# Patient Record
Sex: Female | Born: 1990 | Race: White | Hispanic: No | Marital: Married | Smoking: Former smoker
Health system: Southern US, Community
[De-identification: ages and names within clinical notes are randomized; demographics above are authoritative.]

## PROBLEM LIST (undated history)

## (undated) DIAGNOSIS — Z87440 Personal history of urinary (tract) infections: Secondary | ICD-10-CM

## (undated) DIAGNOSIS — Z87442 Personal history of urinary calculi: Secondary | ICD-10-CM

## (undated) DIAGNOSIS — Z22322 Carrier or suspected carrier of Methicillin resistant Staphylococcus aureus: Secondary | ICD-10-CM

## (undated) DIAGNOSIS — F32A Depression, unspecified: Secondary | ICD-10-CM

## (undated) DIAGNOSIS — B019 Varicella without complication: Secondary | ICD-10-CM

## (undated) DIAGNOSIS — J45909 Unspecified asthma, uncomplicated: Secondary | ICD-10-CM

## (undated) DIAGNOSIS — F329 Major depressive disorder, single episode, unspecified: Secondary | ICD-10-CM

## (undated) DIAGNOSIS — M1990 Unspecified osteoarthritis, unspecified site: Secondary | ICD-10-CM

## (undated) DIAGNOSIS — F419 Anxiety disorder, unspecified: Secondary | ICD-10-CM

## (undated) DIAGNOSIS — Z803 Family history of malignant neoplasm of breast: Secondary | ICD-10-CM

## (undated) DIAGNOSIS — N200 Calculus of kidney: Secondary | ICD-10-CM

## (undated) HISTORY — PX: TUBAL LIGATION: SHX77

## (undated) HISTORY — DX: Personal history of urinary (tract) infections: Z87.440

## (undated) HISTORY — DX: Depression, unspecified: F32.A

## (undated) HISTORY — DX: Unspecified asthma, uncomplicated: J45.909

## (undated) HISTORY — DX: Family history of malignant neoplasm of breast: Z80.3

## (undated) HISTORY — DX: Varicella without complication: B01.9

## (undated) HISTORY — DX: Calculus of kidney: N20.0

## (undated) HISTORY — DX: Major depressive disorder, single episode, unspecified: F32.9

## (undated) HISTORY — DX: Carrier or suspected carrier of methicillin resistant Staphylococcus aureus: Z22.322

---
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
## 2011-08-07 ENCOUNTER — Emergency Department: Payer: Self-pay | Admitting: Unknown Physician Specialty

## 2012-11-14 HISTORY — PX: FOOT SURGERY: SHX648

## 2012-12-15 HISTORY — PX: WOUND DEBRIDEMENT: SHX247

## 2013-01-11 ENCOUNTER — Emergency Department: Payer: Self-pay | Admitting: Emergency Medicine

## 2013-01-11 LAB — URINALYSIS, COMPLETE
Bilirubin,UR: NEGATIVE
Ketone: NEGATIVE
Nitrite: NEGATIVE
Ph: 8 (ref 4.5–8.0)
Protein: NEGATIVE
RBC,UR: 4 /HPF (ref 0–5)
Squamous Epithelial: 17

## 2013-01-11 LAB — CBC
HCT: 41.3 % (ref 35.0–47.0)
HGB: 13.6 g/dL (ref 12.0–16.0)
MCH: 30.2 pg (ref 26.0–34.0)
MCHC: 33 g/dL (ref 32.0–36.0)
Platelet: 318 10*3/uL (ref 150–440)

## 2013-01-11 LAB — LIPASE, BLOOD: Lipase: 136 U/L (ref 73–393)

## 2013-01-11 LAB — COMPREHENSIVE METABOLIC PANEL
Albumin: 4 g/dL (ref 3.4–5.0)
Alkaline Phosphatase: 82 U/L (ref 50–136)
Anion Gap: 3 — ABNORMAL LOW (ref 7–16)
BUN: 11 mg/dL (ref 7–18)
Calcium, Total: 9 mg/dL (ref 8.5–10.1)
Creatinine: 0.82 mg/dL (ref 0.60–1.30)
EGFR (African American): >60
EGFR (Non-African Amer.): >60
Glucose: 89 mg/dL (ref 65–99)
SGOT(AST): 16 U/L (ref 15–37)
SGPT (ALT): 27 U/L (ref 12–78)
Sodium: 141 mmol/L (ref 136–145)
Total Protein: 7.5 g/dL (ref 6.4–8.2)

## 2013-01-12 LAB — WET PREP, GENITAL

## 2013-02-04 ENCOUNTER — Ambulatory Visit: Payer: Self-pay | Admitting: Family Medicine

## 2013-04-03 ENCOUNTER — Emergency Department: Payer: Self-pay | Admitting: Emergency Medicine

## 2013-04-03 LAB — BASIC METABOLIC PANEL
Anion Gap: 7 (ref 7–16)
Calcium, Total: 8.8 mg/dL (ref 8.5–10.1)
Creatinine: 0.89 mg/dL (ref 0.60–1.30)
EGFR (African American): >60
EGFR (Non-African Amer.): >60
Glucose: 114 mg/dL — ABNORMAL HIGH (ref 65–99)
Osmolality: 279 (ref 275–301)
Sodium: 139 mmol/L (ref 136–145)

## 2013-04-03 LAB — URINALYSIS, COMPLETE
Bacteria: NONE SEEN
Ketone: NEGATIVE
Nitrite: NEGATIVE
Ph: 6 (ref 4.5–8.0)
Protein: NEGATIVE
Squamous Epithelial: 5
WBC UR: 4 /HPF (ref 0–5)

## 2013-04-03 LAB — CBC
HCT: 37.9 % (ref 35.0–47.0)
HGB: 13 g/dL (ref 12.0–16.0)
MCH: 30.9 pg (ref 26.0–34.0)
MCHC: 34.3 g/dL (ref 32.0–36.0)
MCV: 90 fL (ref 80–100)
Platelet: 269 10*3/uL (ref 150–440)

## 2013-06-21 ENCOUNTER — Ambulatory Visit: Payer: Self-pay | Admitting: Family Medicine

## 2013-06-21 LAB — URINALYSIS, COMPLETE
Bilirubin,UR: NEGATIVE
Ketone: NEGATIVE

## 2013-06-23 LAB — URINE CULTURE

## 2014-03-13 ENCOUNTER — Emergency Department: Payer: Self-pay | Admitting: Emergency Medicine

## 2014-03-13 LAB — URINALYSIS, COMPLETE
BACTERIA: NONE SEEN
BILIRUBIN, UR: NEGATIVE
GLUCOSE, UR: NEGATIVE mg/dL (ref 0–75)
Nitrite: NEGATIVE
PH: 5 (ref 4.5–8.0)
Protein: NEGATIVE
Specific Gravity: 1.016 (ref 1.003–1.030)

## 2014-08-29 ENCOUNTER — Emergency Department: Payer: Self-pay | Admitting: Emergency Medicine

## 2014-08-29 LAB — CBC WITH DIFFERENTIAL/PLATELET
Basophil #: 0.1 10*3/uL (ref 0.0–0.1)
Basophil %: 0.5 %
EOS ABS: 0.7 10*3/uL (ref 0.0–0.7)
Eosinophil %: 5 %
HCT: 42.2 % (ref 35.0–47.0)
HGB: 13.7 g/dL (ref 12.0–16.0)
Lymphocyte #: 2.4 10*3/uL (ref 1.0–3.6)
Lymphocyte %: 15.8 %
MCH: 30.6 pg (ref 26.0–34.0)
MCHC: 32.4 g/dL (ref 32.0–36.0)
MCV: 95 fL (ref 80–100)
MONO ABS: 1.1 x10 3/mm — AB (ref 0.2–0.9)
Monocyte %: 7.5 %
NEUTROS ABS: 10.6 10*3/uL — AB (ref 1.4–6.5)
Neutrophil %: 71.2 %
Platelet: 231 10*3/uL (ref 150–440)
RBC: 4.47 10*6/uL (ref 3.80–5.20)
RDW: 13 % (ref 11.5–14.5)
WBC: 14.9 10*3/uL — AB (ref 3.6–11.0)

## 2014-08-29 LAB — BASIC METABOLIC PANEL
Anion Gap: 9 (ref 7–16)
BUN: 13 mg/dL (ref 7–18)
CALCIUM: 8.5 mg/dL (ref 8.5–10.1)
Chloride: 104 mmol/L (ref 98–107)
Co2: 26 mmol/L (ref 21–32)
Creatinine: 0.8 mg/dL (ref 0.60–1.30)
EGFR (African American): >60
GLUCOSE: 88 mg/dL (ref 65–99)
Osmolality: 277 (ref 275–301)
Potassium: 4.3 mmol/L (ref 3.5–5.1)
Sodium: 139 mmol/L (ref 136–145)

## 2014-08-30 ENCOUNTER — Inpatient Hospital Stay: Payer: Self-pay | Admitting: Internal Medicine

## 2014-08-30 LAB — CBC
HCT: 39.6 % (ref 35.0–47.0)
HGB: 12.7 g/dL (ref 12.0–16.0)
MCH: 30.7 pg (ref 26.0–34.0)
MCHC: 32 g/dL (ref 32.0–36.0)
MCV: 96 fL (ref 80–100)
Platelet: 216 10*3/uL (ref 150–440)
RBC: 4.13 10*6/uL (ref 3.80–5.20)
RDW: 12.9 % (ref 11.5–14.5)
WBC: 12.3 10*3/uL — ABNORMAL HIGH (ref 3.6–11.0)

## 2014-08-30 LAB — HCG, QUANTITATIVE, PREGNANCY: Beta Hcg, Quant.: <1 m[IU]/mL — ABNORMAL LOW

## 2014-08-30 LAB — BASIC METABOLIC PANEL
Anion Gap: 12 (ref 7–16)
BUN: 9 mg/dL (ref 7–18)
CALCIUM: 8.5 mg/dL (ref 8.5–10.1)
CHLORIDE: 107 mmol/L (ref 98–107)
CREATININE: 0.94 mg/dL (ref 0.60–1.30)
Co2: 20 mmol/L — ABNORMAL LOW (ref 21–32)
Glucose: 95 mg/dL (ref 65–99)
Osmolality: 276 (ref 275–301)
Potassium: 3.6 mmol/L (ref 3.5–5.1)
SODIUM: 139 mmol/L (ref 136–145)

## 2014-08-31 LAB — BASIC METABOLIC PANEL
ANION GAP: 7 (ref 7–16)
BUN: 6 mg/dL — AB (ref 7–18)
CHLORIDE: 112 mmol/L — AB (ref 98–107)
CO2: 23 mmol/L (ref 21–32)
CREATININE: 0.89 mg/dL (ref 0.60–1.30)
Calcium, Total: 7.5 mg/dL — ABNORMAL LOW (ref 8.5–10.1)
EGFR (African American): >60
GLUCOSE: 104 mg/dL — AB (ref 65–99)
OSMOLALITY: 281 (ref 275–301)
Potassium: 4 mmol/L (ref 3.5–5.1)
SODIUM: 142 mmol/L (ref 136–145)

## 2014-08-31 LAB — CBC WITH DIFFERENTIAL/PLATELET
Basophil #: 0 10*3/uL (ref 0.0–0.1)
Basophil %: 0.4 %
Eosinophil #: 0.8 10*3/uL — ABNORMAL HIGH (ref 0.0–0.7)
Eosinophil %: 8.4 %
HCT: 35.7 % (ref 35.0–47.0)
HGB: 11.4 g/dL — ABNORMAL LOW (ref 12.0–16.0)
Lymphocyte #: 1.6 10*3/uL (ref 1.0–3.6)
Lymphocyte %: 17.5 %
MCH: 30.6 pg (ref 26.0–34.0)
MCHC: 31.9 g/dL — ABNORMAL LOW (ref 32.0–36.0)
MCV: 96 fL (ref 80–100)
Monocyte #: 0.9 x10 3/mm (ref 0.2–0.9)
Monocyte %: 10.3 %
NEUTROS PCT: 63.4 %
Neutrophil #: 5.7 10*3/uL (ref 1.4–6.5)
Platelet: 196 10*3/uL (ref 150–440)
RBC: 3.71 10*6/uL — ABNORMAL LOW (ref 3.80–5.20)
RDW: 13 % (ref 11.5–14.5)
WBC: 9 10*3/uL (ref 3.6–11.0)

## 2014-09-01 LAB — VANCOMYCIN, TROUGH: Vancomycin, Trough: 5 ug/mL — ABNORMAL LOW (ref 10–20)

## 2014-09-04 LAB — CULTURE, BLOOD (SINGLE)

## 2014-10-17 DIAGNOSIS — Z8614 Personal history of Methicillin resistant Staphylococcus aureus infection: Secondary | ICD-10-CM

## 2014-10-17 HISTORY — DX: Personal history of Methicillin resistant Staphylococcus aureus infection: Z86.14

## 2014-12-09 ENCOUNTER — Emergency Department: Payer: Self-pay | Admitting: Emergency Medicine

## 2014-12-13 ENCOUNTER — Emergency Department: Payer: Self-pay | Admitting: Physician Assistant

## 2014-12-15 ENCOUNTER — Emergency Department: Payer: Self-pay | Admitting: Emergency Medicine

## 2015-03-07 NOTE — H&P (Signed)
PATIENT NAME:  Katherine Ruiz, Katherine Ruiz MR#:  [id number] DATE OF BIRTH:  1991-07-30  DATE OF ADMISSION:  08/30/2014  PRIMARY CARE PHYSICIAN: Dr. Ursula Beath   CHIEF COMPLAINT: Right leg redness and swelling.   HISTORY OF PRESENT ILLNESS: This is a 24 year old female who presents to the hospital due to worsening right upper leg redness and swelling. The patient was in the Emergency Room yesterday, and had a boil that was lanced and it was packed. She was discharged on some oral Bactrim. Despite taking the Bactrim, her redness has gotten worse and she has more significant pain in that right anterior leg. She therefore came to the ER for further evaluation and was noted to have acute cellulitis which has failed outpatient therapy. Hospitalist services were contacted for further treatment and evaluation.   REVIEW OF SYSTEMS:  CONSTITUTIONAL: No documented fever. No weight gain, no weight loss.  EYES: No blurred or double vision.  ENT: No tinnitus. No postnasal drip. No redness of the oropharynx.  RESPIRATORY: No cough, no wheeze or hemoptysis no dyspnea.  CARDIOVASCULAR: No chest pain, no orthopnea or palpitation, no syncope.  GASTROINTESTINAL: No nausea, vomiting, diarrhea. No abdominal pain. No melena or hematochezia.  GENITOURINARY: No dysuria or hematuria.  ENDOCRINE: No polyuria or nocturia. No heat or cold intolerance.  HEMATOLOGIC: No anemia, no bruising, no bleeding.  INTEGUMENTARY: No rashes. No lesions.  MUSCULOSKELETAL: No arthritis. No swelling. No gout.  NEUROLOGIC: No numbness or tingling. No ataxia. No seizure activity.  PSYCHIATRIC: Positive depression. No anxiety. No ADD.   PAST MEDICAL HISTORY: Consistent with depression.   SOCIAL HISTORY: No smoking. No alcohol abuse. No illicit drug abuse. She lives at home her husband and her daughter. The patient does have history of smoking but quit many years ago. Does have a maybe 5 to 10 pack-year smoking history.   FAMILY HISTORY:  Mother and father are both alive. Diabetes runs in the family. Breast cancer runs in the family.   CURRENT MEDICATIONS: Zoloft and 150 mg at bedtime, Bactrim double strength 1 tab b.i.d.   PHYSICAL EXAMINATION: Presently is as follows:  VITAL SIGNS: Temperature is 98.6, pulse 90, respirations 16, blood pressure 116/72, saturations 100% on room air.  GENERAL: The patient is a pleasant -appearing female in no apparent distress.  HEAD, EYES, EARS, NOSE AND THROAT: Atraumatic, normocephalic. Extraocular muscles are intact. Pupils equal and reactive to light. Sclerae anicteric. No conjunctival injection. No pharyngeal erythema.  NECK: Supple. There is no jugular venous distention. No bruits, no lymphadenopathy, no thyromegaly.  HEART: Regular rate and rhythm. No murmurs, no rubs, no clicks.  LUNGS: Clear to auscultation bilaterally. No rales, no rhonchi, no wheezes.  ABDOMEN: Soft, flat, nontender, nondistended. Has good bowel sounds. No hepatosplenomegaly appreciated.  EXTREMITIES: No evidence of any cyanosis, clubbing, or peripheral edema. Has +2 pedal and radial pulses bilaterally.  NEUROLOGIC: She is alert, awake, and oriented x 3 with no focal motor or sensory deficits appreciated bilaterally.  SKIN: Moist and warm. She does have a boil on the right anterior thigh which has been packed. She has some brawny erythema with streaks down the lower part of her leg, which is warm to touch and painful.  LYMPHATIC: There is no cervical or axillary lymphadenopathy.   LABORATORY DATA: Serum glucose of 95, BUN 9, creatinine 0.9, sodium 139, potassium 3.6, chloride 107, bicarbonate 20. The patient's white cell count is 12.3, hemoglobin 12.7, hematocrit 39.6, platelet count 269,000.   ASSESSMENT AND PLAN: This is a  24 year old female with history of depression, who presented to the ER with right leg swelling and redness getting worse. The patient was seen in the ER yesterday, noted to have cellulitis/abscess  and had an incision and drainage done and the wound packed and discharged on oral Bactrim but has failed outpatient therapy.    1.  Acute right leg cellulitis. This is likely the cause of the patient's worsening right lower extremity redness and pain. The patient has failed outpatient therapy with oral Bactrim and recent incision and drainage. I will start the patient on IV vancomycin, follow her clinically. Follow blood and wound cultures.  2.  Red man syndrome. This is likely secondary to patient's vancomycin infusion. Discuss with pharmacy. Will redose at a lower dose and a lower rate. She has clinically improved with some as needed Benadryl.  3.  Depression. Continue with her Zoloft.   CODE STATUS: The patient is a full code.   TIME SPENT: 45 minutes.    ____________________________ Rolly PancakeVivek J. Cherlynn KaiserSainani, MD vjs:at D: 08/30/2014 19:53:17 ET T: 08/30/2014 20:12:55 ET JOB#: [id number]  cc: Rolly PancakeVivek J. Cherlynn KaiserSainani, MD, <Dictator> Houston SirenVIVEK J SAINANI MD ELECTRONICALLY SIGNED 09/22/2014 11:00

## 2015-03-07 NOTE — Consult Note (Signed)
PATIENT NAME:  Katherine Ruiz, Katherine Ruiz MR#:  [id number] DATE OF BIRTH:  [birth year]  DATE OF CONSULTATION:  09/01/2014  REFERRING PHYSICIAN:   CONSULTING PHYSICIAN:  Cristal Deerhristopher A. Elyzabeth Goatley, MD  REASON FOR CONSULTATION: Right leg redness, swelling, evaluate for abscess.   HISTORY OF PRESENT ILLNESS: Miss Katherine Ruiz is a pleasant 24 year old with history of right upper leg pain, redness, and swelling that began approximately 3 days ago. She was in the Emergency Room 2 days ago and underwent an I and D of what was thought to be a small abscess. She was discharged on oral Bactrim. Redness had gotten  worse and had significant pain. Since in the hospital she has been on vancomycin and Zosyn and redness has improved, however still has area of induration and swelling and concern for abscess. There was packing initially in her wound which was removed and it was found to be extremely small and likely inadequate. Otherwise no current fevers, chills, night sweats, shortness of breath, cough, chest pain, abdominal pain, nausea, vomiting, diarrhea, constipation, dysuria, or hematuria.   PAST MEDICAL HISTORY:  Depression, history of abscess in leg approximately 10 years ago.   SOCIAL HISTORY: Denies tobacco or alcohol or drug use. Does have a history of smoking but not currently.   FAMILY HISTORY: Diabetes, breast cancer.   CURRENT MEDICATIONS: At the time of admission were Zoloft and Bactrim.   PHYSICAL EXAMINATION:  VITAL SIGNS: Temperature 98.3, pulse 65, blood pressure 95/58, respirations 18, 90% on room air.  GENERAL: No acute distress. Alert and oriented x 3.   HEAD:  Normocephalic, atraumatic.   EYES: No scleral icterus. No conjunctivitis.  FACE: No obvious facial trauma.  Normal external nose. Normal external ears.  CHEST: Lungs clear to auscultation. Moving air well.  HEART: Regular rate and rhythm. No murmur present  LUNGS: Clear to auscultation, moving air well ABDOMEN: Soft, nontender, nondistended.   EXTREMITIES: Moves all extremities well. Strength 5 out of 5. Does have an approximately 4 x 4 cm area of induration under right leg skin anteriorly, has a circle drawn around where there was previous cellulitis, currently without significant cellulitis.  NEUROLOGIC: Cranial nerves II-XII grossly intact. Sensory intact in all 4 extremities.   LABORATORY DATA: Most recent labs were on October 18, white cell count is 9.0 with 63% neutrophils and 17% lymphocytes.    IMAGING: No current imaging.   ASSESSMENT AND PLAN: Miss Katherine Ruiz is a pleasant 24 year old with a possible incompletely drained abscess to right leg. We will make n.p.o. after midnight. We will evaluate in the morning. May need operative drainage. Continue antibiotics.    ____________________________ Si Raiderhristopher A. Gradie Butrick, MD cal:bu D: 09/01/2014 17:45:22 ET T: 09/01/2014 18:47:05 ET JOB#: [id number]  cc: Cristal Deerhristopher A. Sabeen Piechocki, MD, <Dictator> Jarvis NewcomerHRISTOPHER A Tristyn Demarest MD ELECTRONICALLY SIGNED 09/16/2014 7:01

## 2015-03-07 NOTE — Op Note (Signed)
PATIENT NAME:  Katherine Ruiz, [name] MR#:  [id number] DATE OF BIRTH:  Sep 20, 1991  DATE OF PROCEDURE:  09/02/2014  PREOPERATIVE DIAGNOSIS: Right thigh abscess and cellulitis.   POSTOPERATIVE DIAGNOSIS: Right thigh abscess and cellulitis.   PROCEDURE PERFORMED: Incision and drainage of right medial thigh abscess.   SURGEON: Ida Roguehristopher Nasire Reali, MD  ANESTHESIA: LMA general.   ESTIMATED BLOOD LOSS: 5 mL.   COMPLICATIONS: None.   SPECIMENS: None.   INDICATION FOR SURGERY: [name] is a pleasant 24 year old with approximately 5 days of right groin swelling, pain and erythema. She had initial I and D in the Emergency Room. However, upon removing packing I felt that it was inadequately drained. I thus brought her to the operating room suite for formal incision and drainage.   DETAILS OF PROCEDURE: Informed was obtained. [name] was brought to the operating room suite. She was induced. Endotracheal tube was placed. General anesthesia was administered. Her right groin was prepped and draped in standard surgical fashion. A timeout was then performed correctly identifying the patient name, operative site and procedure to be performed. Initially, an incision at the inferior aspect of the large area of induration, possible abscess, was made. However, no purulence was obtained. I then ended up entering the previous I and D site and I did get a mild amount of purulence. I then used a hemostat to open the cavity which was quite large and extended it laterally and made a counterincision at the lateral most aspect. A small Penrose drain was placed and sutured to the edges of the incision site using a 3-0 nylon suture. The wound was then irrigated and covered. The patient was then awoken, tube was removed, her LMA was removed and brought to the postanesthesia care unit. There were no immediate complications. Needle, sponge, and instrument counts were correct at the end of the procedure.     ____________________________ Si Raiderhristopher A. Magnus Crescenzo, MD cal:sb D: 09/02/2014 11:17:25 ET T: 09/02/2014 13:00:54 ET JOB#: [id number]  cc: Cristal Deerhristopher A. Tiarra Anastacio, MD, <Dictator> Jarvis NewcomerHRISTOPHER A Maekayla Giorgio MD ELECTRONICALLY SIGNED 09/16/2014 7:01

## 2015-03-07 NOTE — Discharge Summary (Signed)
Dates of Admission and Diagnosis:  Date of Admission 30-Aug-2014   Date of Discharge 02-Sep-2014   Admitting Diagnosis cellulitis of thigh   Final Diagnosis Cellulitis and abscess on right thigh Depression Headache    Chief Complaint/History of Present Illness a 24 year old female who presents to the hospital due to worsening right upper leg redness and swelling. The patient was in the Emergency Room yesterday, and had a boil that was lanced and it was packed. She was discharged on some oral Bactrim. Despite taking the Bactrim, her redness has gotten worse and she has more significant pain in that right anterior leg. She therefore came to the ER for further evaluation and was noted to have acute cellulitis which has failed outpatient therapy. Hospitalist services were contacted for further treatment and evaluation.   Allergies:  lamotrigine: Hives  vancomycin: Rash  Pertinent Past History:  Pertinent Past History depression   Hospital Course:  Hospital Course * cellulitis- right thigh   s/p I&D  by ER- still worsening.  By Admitting on vanc- added zosyn.   called surgery. better now.   surgery did I& D again 09/02/14- advised to give oral Abx and follow in office in 1 week.  * headache. migraine.   morphin, benadryl, magnesium.   norflex. resolved now.  * depression cont zoloft.   Condition on Discharge Stable   Code Status:  Code Status Full Code   DISCHARGE INSTRUCTIONS HOME MEDS:  Medication Reconciliation: Patient's Home Medications at Discharge:     Medication Instructions  depo-provera 150 mg/ml intramuscular suspension  1 milliliter(s) intramuscular every 3 months.   adderall xr 30 mg oral capsule, extended release  1 cap(s) orally once a day (in the morning)   ventolin hfa cfc free 90 mcg/inh inhalation aerosol  2 puff(s) inhaled 4 times a day as needed for shortness of breath/ wheezing.    sertraline 100 mg oral tablet  1.5 tabs (150mg ) orally once a day    amoxicillin-clavulanate 875 mg-125 mg oral tablet  1 tab(s) orally every 12 hours x 8 days   acetaminophen-hydrocodone 325 mg-5 mg oral tablet  1 tab(s) orally every 4 hours x 5 days, As Needed, moderate pain (4-6/10) , As needed, moderate pain (4-6/10)    STOP TAKING THE FOLLOWING MEDICATION(S):    acetaminophen-oxycodone 325 mg-5 mg oral tablet: 1  orally every 6 hours as needed for pain  Physician's Instructions:  Dressing Care Keep dressing dry   Diet Regular   Activity Limitations As tolerated   Return to Work Not Applicable   Time frame for Follow Up Appointment 1-2 weeks  Dr. Verline LemaLundquist     Lundquist, Christopher A(Consultant): [facility] [place], [address], [place], [place] [address], [phone]  TIME SPENT:  Total Time: Greater than 30 minutes   Electronic Signatures: Altamese DillingVachhani, Trang Bouse (MD)  (Signed 23-Oct-15 20:26)  Authored: ADMISSION DATE AND DIAGNOSIS, CHIEF COMPLAINT/HPI, Allergies, PERTINENT PAST HISTORY, HOSPITAL COURSE, DISCHARGE INSTRUCTIONS HOME MEDS, PATIENT INSTRUCTIONS, Follow Up Physician, TIME SPENT   Last Updated: 23-Oct-15 20:26 by Altamese DillingVachhani, Aristea Posada (MD)

## 2015-11-20 ENCOUNTER — Ambulatory Visit (INDEPENDENT_AMBULATORY_CARE_PROVIDER_SITE_OTHER): Payer: BLUE CROSS/BLUE SHIELD | Admitting: Primary Care

## 2015-11-20 ENCOUNTER — Encounter: Payer: Self-pay | Admitting: Primary Care

## 2015-11-20 VITALS — BP 118/82 | HR 68 | Temp 97.6°F | Ht 61.75 in | Wt 210.8 lb

## 2015-11-20 DIAGNOSIS — Z3049 Encounter for surveillance of other contraceptives: Secondary | ICD-10-CM | POA: Diagnosis not present

## 2015-11-20 DIAGNOSIS — Z3042 Encounter for surveillance of injectable contraceptive: Secondary | ICD-10-CM

## 2015-11-20 DIAGNOSIS — F329 Major depressive disorder, single episode, unspecified: Secondary | ICD-10-CM | POA: Insufficient documentation

## 2015-11-20 LAB — POCT URINE PREGNANCY: Preg Test, Ur: NEGATIVE

## 2015-11-20 MED ORDER — MEDROXYPROGESTERONE ACETATE 150 MG/ML IM SUSP
150.0000 mg | Freq: Once | INTRAMUSCULAR | Status: AC
Start: 2015-11-20 — End: 2015-11-20
  Administered 2015-11-20: 150 mg via INTRAMUSCULAR

## 2015-11-20 NOTE — Progress Notes (Signed)
Pre visit review using our clinic review tool, if applicable. No additional management support is needed unless otherwise documented below in the visit note. 

## 2015-11-20 NOTE — Assessment & Plan Note (Signed)
Diagnosed 2 years ago by prior psychiatrist. Once managed on Lamictal, depakote, zoloft. Not on meds since April 2016. Feeling fine now. Suggested she see her psychiatrist if she develops any s/s of depression. Denies SI/HI

## 2015-11-20 NOTE — Patient Instructions (Signed)
You've been provided with a Depo Provera injection.  Schedule Nurse Only Visits for repeat injections as discussed.    Please schedule a physical with me within the next 3-6 months. You may also schedule a lab only appointment 3-4 days prior. We will discuss your lab results in detail during your physical.  It was a pleasure to meet you today! Please don't hesitate to call me with any questions. Welcome to [facility]!

## 2015-11-20 NOTE — Progress Notes (Signed)
Subjective:    Patient ID: Katherine Ruiz, female    DOB: 17-Jun-1991, 25 y.o.   MRN: [id number]  HPI  [name] is a 25 year old female who presents today to establish care and discuss the problems mentioned below. Will obtain old records.  1) Contraception: She would like to discuss birth control options today. She's been on the Depo Provera injection for the past for 5 years. Her last injection was in March of 2016. Her last menstrual period was November 20th 2016, which was her first period since stopping Depo Provera. She's had negative pregnancy tests at home recently. She's gained 40 pounds since starting the Depo Provera injections and has a history of heavy, painful periods.   2) Depression: Diagnosed 2 years ago by Arlys John, MD. She was previously on medication including Lamictal, Depakote, and Zoloft at various times. She has not had her medication since April of 2016. She last saw her psychiatrist in April 2016 as she had no insurance and could not afford to go. She started smoking April 2016, quit 10 weeks ago. Overall she's feeling okay off her medications. She feels overwhelmed at times with her 2 small children and work life. Denies SI/HI.   3) Asthma: Diagnosed as a child. She has an albuterol inhaler that she will use 2-3 times a year during seasonal changes. Denies recent use and SOB.   Review of Systems  Constitutional: Negative for unexpected weight change.  HENT: Negative for rhinorrhea.   Respiratory: Negative for shortness of breath and wheezing.   Cardiovascular: Negative for chest pain.  Gastrointestinal: Negative for diarrhea and constipation.  Genitourinary: Negative for difficulty urinating.       Irregular periods, once on Depo  Musculoskeletal:       History of Carpal Tunnel.  Skin: Negative for rash.  Allergic/Immunologic: Positive for environmental allergies.  Neurological: Negative for dizziness, numbness and headaches.  Psychiatric/Behavioral:   See HPI       Past Medical History  Diagnosis Date  . Asthma   . Chickenpox   . Depression   . Kidney stone   . History of UTI   . MRSA (methicillin resistant staph aureus) culture positive     Social History   Social History  . Marital Status: Single    Spouse Name: N/A  . Number of Children: N/A  . Years of Education: N/A   Occupational History  . Not on file.   Social History Main Topics  . Smoking status: Never Smoker   . Smokeless tobacco: Not on file  . Alcohol Use: No  . Drug Use: Not on file  . Sexual Activity: Not on file   Other Topics Concern  . 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 Foot surgery Left 2014    Family History  Problem Relation Age of Onset  . Arthritis Father   . Hyperlipidemia Father   . Hypertension Father   . Mental illness Father   . Diabetes Father   . Hyperlipidemia Maternal Grandmother   . Arthritis Maternal Grandmother   . Breast cancer Maternal Grandmother   . Ovarian cancer Maternal Grandmother   . Hypertension Maternal Grandmother   . Mental illness Maternal Grandmother   . Diabetes Maternal Grandmother   .  Arthritis Maternal Grandfather   . Mental illness Paternal Grandmother   . Hypertension Paternal Grandmother   . Arthritis Paternal Grandmother   . Arthritis Paternal Grandfather   . Mental illness Paternal Grandfather   . Hypertension Paternal Grandfather     Allergies  Allergen Reactions  . Lamotrigine Hives  . Vancomycin Swelling    No current outpatient prescriptions on file prior to visit.   No current facility-administered medications on file prior to visit.    BP 118/82 mmHg  Pulse 68  Temp(Src) 97.6 F (36.4 C) (Oral)  Ht 5' 1.75" (1.568 m)  Wt 210 lb 12.8 oz (95.618 kg)  BMI 38.89 kg/m2  SpO2 98%   LMP 10/04/2015    Objective:   Physical Exam  Constitutional: She is oriented to person, place, and time. She appears well-nourished.  Cardiovascular: Normal rate and regular rhythm.   Pulmonary/Chest: Effort normal and breath sounds normal.  Neurological: She is alert and oriented to person, place, and time.  Skin: Skin is warm and dry.  Psychiatric: She has a normal mood and affect.          Assessment & Plan:  Birth Control Initiation:  On depo provera for 5 years, off since April 2016 since losing insurance. Would like to go back on. Discussed different options for birth control including pills, nuva ring, etc. She doesn't want to have periods as she experiences heavy, painful periods. LMP was in November 2016 which was heavy and painful. Urine pregnancy negative today. Restart Depo Provera. Discussed importance of tobacco cessation. She verbalized understanding.

## 2015-11-26 ENCOUNTER — Telehealth: Payer: Self-pay

## 2015-11-26 NOTE — Telephone Encounter (Signed)
She will need an office visit for further evaluation as this could be bacterial or fungal. We need to ensure we treat with the right medication.

## 2015-11-26 NOTE — Telephone Encounter (Signed)
Pt was seen 11/20/15 but new symptoms of yeast infection; no real vaginal discharge but vaginal and perineal itching; pt has not taken abx but cannot come in for appt on 11/27/15. [facility]. Pt request.cb on 11/27/15.

## 2015-11-27 NOTE — Telephone Encounter (Signed)
Called and notified patient of Kate's comments. Patient verbalized understanding. Patient stated that she will try to call on Monday to see if she can come.

## 2015-12-04 ENCOUNTER — Emergency Department
Admission: EM | Admit: 2015-12-04 | Discharge: 2015-12-04 | Discharge status: Against Medical Advice | Payer: BLUE CROSS/BLUE SHIELD | Attending: Emergency Medicine | Admitting: Emergency Medicine

## 2015-12-04 DIAGNOSIS — Y9289 Other specified places as the place of occurrence of the external cause: Secondary | ICD-10-CM | POA: Insufficient documentation

## 2015-12-04 DIAGNOSIS — Z792 Long term (current) use of antibiotics: Secondary | ICD-10-CM | POA: Diagnosis not present

## 2015-12-04 DIAGNOSIS — T370X1A Poisoning by sulfonamides, accidental (unintentional), initial encounter: Secondary | ICD-10-CM | POA: Diagnosis not present

## 2015-12-04 DIAGNOSIS — T3695XA Adverse effect of unspecified systemic antibiotic, initial encounter: Secondary | ICD-10-CM

## 2015-12-04 DIAGNOSIS — X58XXXA Exposure to other specified factors, initial encounter: Secondary | ICD-10-CM | POA: Insufficient documentation

## 2015-12-04 DIAGNOSIS — Y9389 Activity, other specified: Secondary | ICD-10-CM | POA: Diagnosis not present

## 2015-12-04 DIAGNOSIS — T7840XA Allergy, unspecified, initial encounter: Secondary | ICD-10-CM | POA: Diagnosis present

## 2015-12-04 DIAGNOSIS — Y998 Other external cause status: Secondary | ICD-10-CM | POA: Diagnosis not present

## 2015-12-04 MED ORDER — FAMOTIDINE 20 MG PO TABS
20.0000 mg | ORAL_TABLET | Freq: Once | ORAL | Status: AC
  Administered 2015-12-04: 20 mg via ORAL

## 2015-12-04 MED ORDER — FAMOTIDINE 20 MG PO TABS
ORAL_TABLET | ORAL | Status: AC
  Administered 2015-12-04: 20 mg via ORAL
  Filled 2015-12-04: qty 1

## 2015-12-04 MED ORDER — DIPHENHYDRAMINE HCL 25 MG PO CAPS
ORAL_CAPSULE | ORAL | Status: AC
Start: 2015-12-04 — End: 2015-12-04
  Administered 2015-12-04: 50 mg via ORAL
  Filled 2015-12-04: qty 2

## 2015-12-04 MED ORDER — DIPHENHYDRAMINE HCL 25 MG PO CAPS
50.0000 mg | ORAL_CAPSULE | Freq: Once | ORAL | Status: AC
  Administered 2015-12-04: 50 mg via ORAL

## 2015-12-04 NOTE — ED Notes (Signed)
Pt unable to wait for treatment as she must go and pick up her children.  Explained to patient that she can return to the ER and should return to the ER if sxs get worse or any difficulty breathing.  These risks were explained by PA.  Pt in NAD on discharge.

## 2015-12-04 NOTE — ED Notes (Signed)
States she developed an itching sensation after taking septra   No visible rash noted   No diff swallowing. or resp distress

## 2015-12-04 NOTE — ED Notes (Signed)
Pt states she is on a RX for clindamycin for a vaginal infection, states she has medication in a high cabinet always from children and didn't pay attention this morning and took a bactrim instead, pt is tearful and anxiety, states she has an itchy sensation from inside out. No noted rash,. Denies any difficulty swallowing or breathing at present, denies any swelling in throat or mouth

## 2015-12-04 NOTE — ED Provider Notes (Signed)
[facility] Emergency Department Provider Note  ____________________________________________  Time seen: Approximately 12:45 PM  I have reviewed the triage vital signs and the nursing notes.   HISTORY  Chief Complaint Allergic Reaction    HPI Katherine Ruiz is a 25 y.o. female who presents to emergency department stating that she has an allergic reaction to medication. Patient states that she is on an antibiotic, clindamycin, for a vaginal infection. She states that she accidentally took her prescription of Bactrim that was previously prescribed but not completed instead of the clindamycin by accident today. She states that she now has a "itchy sensation from the inside out." She denies any rash, difficulty breathing or swallowing, chest pain, shortness of breath. Patient states that she has no history of allergic reactions to Bactrim or sulfa drugs.  During Initial interview patient states that she cannot wait for any treatment but must leave to pick up her children.   Past Medical History  Diagnosis Date  . Asthma   . Chickenpox   . Depression   . Kidney stone   . History of UTI   . MRSA (methicillin resistant staph aureus) culture positive     Patient Active Problem List   Diagnosis Date Noted  . Major depressive disorder (HCC) 11/20/2015    Past Surgical History  Procedure Laterality Date  . Foot surgery Left 2014    Current Outpatient Rx  Name  Route  Sig  Dispense  Refill  . clindamycin (CLEOCIN) 150 MG capsule   Oral   Take 150 mg by mouth 3 (three) times daily.           Allergies Lamotrigine; Vancomycin; and Septra  Family History  Problem Relation Age of Onset  . Arthritis Father   . Hyperlipidemia Father   . Hypertension Father   . Mental illness Father   . Diabetes Father   . Hyperlipidemia Maternal Grandmother   . Arthritis Maternal Grandmother   . Breast cancer Maternal Grandmother   . Ovarian cancer Maternal  Grandmother   . Hypertension Maternal Grandmother   . Mental illness Maternal Grandmother   . Diabetes Maternal Grandmother   . Arthritis Maternal Grandfather   . Mental illness Paternal Grandmother   . Hypertension Paternal Grandmother   . Arthritis Paternal Grandmother   . Arthritis Paternal Grandfather   . Mental illness Paternal Grandfather   . Hypertension Paternal Grandfather     Social History Social History  Substance Use Topics  . Smoking status: Never Smoker   . Smokeless tobacco: None  . Alcohol Use: No     Review of Systems  Constitutional: No fever/chills Eyes: No visual changes. No discharge ENT: No sore throat. Cardiovascular: no chest pain. Respiratory: no cough. No SOB. Gastrointestinal: No abdominal pain.  No nausea, no vomiting.  No diarrhea.  No constipation. Genitourinary: Negative for dysuria. No hematuria Musculoskeletal: Negative for back pain. Skin: Negative for rash. Positive for itching sensation. Neurological: Negative for headaches, focal weakness or numbness. 10-point ROS otherwise negative.  ____________________________________________   PHYSICAL EXAM:  VITAL SIGNS: ED Triage Vitals  Enc Vitals Group     BP 12/04/15 1148 122/78 mmHg     Pulse Rate 12/04/15 1148 81     Resp 12/04/15 1148 18     Temp 12/04/15 1148 97.9 F (36.6 C)     Temp Source 12/04/15 1148 Oral     SpO2 12/04/15 1148 99 %     Weight 12/04/15 1148 198 lb (89.812 kg)  Height 12/04/15 1148  (1.549 m)     Head Cir --      Peak Flow --      Pain Score 12/04/15 1210 2     Pain Loc --      Pain Edu? --      Excl. in GC? --      Constitutional: Alert and oriented. Well appearing and in no acute distress. Eyes: Conjunctivae are normal. PERRL. EOMI. Head: Atraumatic. ENT:      Ears:       Nose: No congestion/rhinnorhea.      Mouth/Throat: Mucous membranes are moist.  Neck: No stridor.   Hematological/Lymphatic/Immunilogical: No cervical  lymphadenopathy. Cardiovascular: Normal rate, regular rhythm. Normal S1 and S2.  Good peripheral circulation. Respiratory: Normal respiratory effort without tachypnea or retractions. Lungs CTAB. No decreased or absent breath sounds. Gastrointestinal: Soft and nontender. No distention. No CVA tenderness. Musculoskeletal: No lower extremity tenderness nor edema.  No joint effusions. Neurologic:  Normal speech and language. No gross focal neurologic deficits are appreciated.  Skin:  Skin is warm, dry and intact. No rash noted. Psychiatric: Mood and affect are normal. Speech and behavior are normal. Patient exhibits appropriate insight and judgement.   ____________________________________________   LABS (all labs ordered are listed, but only abnormal results are displayed)  Labs Reviewed - No data to display ____________________________________________  EKG   ____________________________________________  RADIOLOGY   No results found.  ____________________________________________    PROCEDURES  Procedure(s) performed:       Medications  famotidine (PEPCID) tablet 20 mg (20 mg Oral Given 12/04/15 1205)  diphenhydrAMINE (BENADRYL) capsule 50 mg (50 mg Oral Given 12/04/15 1205)     ____________________________________________   INITIAL IMPRESSION / ASSESSMENT AND PLAN / ED COURSE  Pertinent labs & imaging results that were available during my care of the patient were reviewed by me and considered in my medical decision making (see chart for details).  There are no signs of severe allergic reaction at this time. After initial interview and brief inspection patient states that she has no longer able to wait because she must go pick up her children from school. I advised patient that she should have an indication scan here in the emergency department to include Solu-Medrol. Patient is given oral Benadryl and famotidine and emergency department. Patient declines any further  medications at this time and declines to wait any longer. Patient is advised that she is to return to the emergency department for any worsening of her symptoms. Patient is advised of the risk of leaving AMA. She verbalizes understanding of same. Patient signs out AMA.     ____________________________________________  FINAL CLINICAL IMPRESSION(S) / ED DIAGNOSES  Final diagnoses:  Antibiotic reaction, initial encounter      NEW MEDICATIONS STARTED DURING THIS VISIT:  Discharge Medication List as of 12/04/2015 12:46 PM          Delorise Royals Cuthriell, PA-C 12/04/15 1303  Sharyn Creamer, MD 12/04/15 1715

## 2015-12-04 NOTE — Discharge Instructions (Signed)
Drug Allergy °Allergic reactions to medicines are common. Some allergic reactions are mild. A delayed type of drug allergy that occurs 1 week or more after exposure to a medicine or vaccine is called serum sickness. A life-threatening, sudden (acute) allergic reaction that involves the whole body is called anaphylaxis. °CAUSES  °"True" drug allergies occur when there is an allergic reaction to a medicine. This is caused by overactivity of the immune system. First, the body becomes sensitized. The immune system is triggered by your first exposure to the medicine. Following this first exposure, future exposure to the same medicine may be life-threatening. °Almost any medicine can cause an allergic reaction. Common ones are: °· Penicillin. °· Sulfonamides (sulfa drugs). °· Local anesthetics. °· X-ray dyes that contain iodine. °SYMPTOMS  °Common symptoms of a minor allergic reaction are: °· Swelling around the mouth. °· An itchy red rash or hives. °· Vomiting or diarrhea. °Anaphylaxis can cause swelling of the mouth and throat. This makes it difficult to breathe and swallow. Severe reactions can be fatal within seconds, even after exposure to only a trace amount of the drug that causes the reaction. °HOME CARE INSTRUCTIONS °· If you are unsure of what caused your reaction, write down: °¨ The names of the medicines you took. °¨ How much medicine you took. °¨ How you took the medicine, such as whether you took a pill, injected the medicine, or applied it to your skin. °¨ All of the things you ate and drank. °¨ The date and time of your reaction. °¨ The symptoms of the reaction. °· You may want to follow up with an allergy specialist after the reaction has cleared in order to be tested to confirm the allergy. It is important to confirm that your reaction is an allergy, not just a side effect to the medicine. If you have a true allergy to a medicine, this may prevent that medicine and related medicines from being given to  you when you are very ill. °· If you have hives or a rash: °¨ Take medicines as directed by your caregiver. °¨ You may use an over-the-counter antihistamine (diphenhydramine) as needed. °¨ Apply cold compresses to the skin or take baths in cool water. Avoid hot baths or showers. °· If you are severely allergic: °¨ Continuous observation after a severe reaction may be needed. Hospitalization is often required. °¨ Wear a medical alert bracelet or necklace stating your allergy. °¨ You and your family must learn how to use an anaphylaxis kit or give an epinephrine injection to temporarily treat an emergency allergic reaction. If you have had a severe reaction, always carry your epinephrine injection or anaphylaxis kit with you. This can be lifesaving if you have a severe reaction. °· Do not drive or perform tasks after treatment until the medicines used to treat your reaction have worn off, or until your caregiver says it is okay. °· If you have a drug allergy that was confirmed by your health care provider: °¨ Carry information about the drug allergy with you at all times. °¨ Always check with a pharmacist before taking any over-the-counter medicine. °SEEK MEDICAL CARE IF:  °· You think you had an allergic reaction. Symptoms usually start within 30 minutes after exposure. °· Symptoms are getting worse rather than better. °· You develop new symptoms. °· The symptoms that brought you to your caregiver return. °SEEK IMMEDIATE MEDICAL CARE IF:  °· You have swelling of the mouth, difficulty breathing, or wheezing. °· You have a tight   feeling in your chest or throat.  You develop hives, swelling, or itching all over your body.  You develop severe vomiting or diarrhea.  You feel faint or pass out. This is an emergency. Use your epinephrine injection or anaphylaxis kit as you have been instructed. Call for emergency medical help. Even if you improve after the injection, you need to be examined at a hospital emergency  department. MAKE SURE YOU:   Understand these instructions.  Will watch your condition.  Will get help right away if you are not doing well or get worse.   This information is not intended to replace advice given to you by your health care provider. Make sure you discuss any questions you have with your health care provider.   Document Released: 10/31/2005 Document Revised: 11/21/2014 Document Reviewed: 06/02/2015 Elsevier Interactive Patient Education [employer].

## 2015-12-08 ENCOUNTER — Other Ambulatory Visit: Payer: Self-pay | Admitting: Primary Care

## 2015-12-08 ENCOUNTER — Ambulatory Visit (INDEPENDENT_AMBULATORY_CARE_PROVIDER_SITE_OTHER): Payer: BLUE CROSS/BLUE SHIELD | Admitting: Primary Care

## 2015-12-08 ENCOUNTER — Encounter: Payer: Self-pay | Admitting: Primary Care

## 2015-12-08 VITALS — BP 122/82 | HR 63 | Temp 97.8°F | Ht 61.75 in | Wt 207.1 lb

## 2015-12-08 DIAGNOSIS — N898 Other specified noninflammatory disorders of vagina: Secondary | ICD-10-CM

## 2015-12-08 DIAGNOSIS — L298 Other pruritus: Secondary | ICD-10-CM

## 2015-12-08 NOTE — Patient Instructions (Signed)
Your symptoms are likely related to a yeast infection; however, there may be bacterial involvement as well.   I'm going to send off your specimen for a better look.  I will be in touch with you tomorrow.  It was a pleasure to see you today!

## 2015-12-08 NOTE — Progress Notes (Signed)
Pre visit review using our clinic review tool, if applicable. No additional management support is needed unless otherwise documented below in the visit note. 

## 2015-12-08 NOTE — Progress Notes (Signed)
Subjective:    Patient ID: Katherine Ruiz, female    DOB: 12/02/1990, 25 y.o.   MRN: [id number]  HPI  [name] is a 25 year old female who presents today with a chief complaint of vaginal itching. She first noticed her itching about 7 days ago. She's tried monistat for 7 days which provided temporary improvement and also took 1 pill of an old bactrim prescription and experienced an "allergic reaction". Denies vaginal discharge, fevers, dyruria, hematuria, flank pain, abdominal pain.   Review of Systems  Constitutional: Negative for fever and chills.  Genitourinary: Negative for dysuria, hematuria, flank pain, vaginal discharge and pelvic pain.       Vaginal itching.       Past Medical History  Diagnosis Date  . Asthma   . Chickenpox   . Depression   . Kidney stone   . History of UTI   . MRSA (methicillin resistant staph aureus) culture positive     Social History   Social History  . Marital Status: Single    Spouse Name: N/A  . Number of Children: N/A  . Years of Education: N/A   Occupational History  . Not on file.   Social History Main Topics  . Smoking status: Never Smoker   . Smokeless tobacco: Not on file  . Alcohol Use: No  . Drug Use: Not on file  . Sexual Activity: Not on file   Other Topics Concern  . 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 Foot surgery Left 2014    Family History  Problem Relation Age of Onset  . Arthritis Father   . Hyperlipidemia Father   . Hypertension Father   . Mental illness Father   . Diabetes Father   . Hyperlipidemia Maternal Grandmother   . Arthritis Maternal Grandmother   . Breast cancer Maternal Grandmother   . Ovarian cancer Maternal Grandmother   . Hypertension Maternal Grandmother   . Mental illness Maternal Grandmother     . Diabetes Maternal Grandmother   . Arthritis Maternal Grandfather   . Mental illness Paternal Grandmother   . Hypertension Paternal Grandmother   . Arthritis Paternal Grandmother   . Arthritis Paternal Grandfather   . Mental illness Paternal Grandfather   . Hypertension Paternal Grandfather     Allergies  Allergen Reactions  . Lamotrigine Hives  . Vancomycin Swelling  . Septra [Sulfamethoxazole-Trimethoprim] Rash    No current outpatient prescriptions on file prior to visit.   No current facility-administered medications on file prior to visit.    BP 122/82 mmHg  Pulse 63  Temp(Src) 97.8 F (36.6 C) (Oral)  Ht 5' 1.75" (1.568 m)  Wt 207 lb 1.9 oz (93.949 kg)  BMI 38.21 kg/m2  SpO2 98%  LMP 10/04/2015    Objective:   Physical Exam  Constitutional: She appears well-nourished.  Cardiovascular: Normal rate and regular rhythm.   Pulmonary/Chest: Effort normal and breath sounds normal.  Genitourinary: Vagina normal. Cervix exhibits discharge. Cervix exhibits no motion tenderness.  Mild amount of whitish discharge          Assessment & Plan:  Vaginal Itching:  Present for about 7 days, slight temporary improvement with Monistat OTC. Wet prep sent off for further evaluation. Vaginal exam with mild amount  of whitish discharge, no CMT. Will treat if necessary. No urinary symptoms, so no UA is needed at this time.

## 2015-12-09 ENCOUNTER — Other Ambulatory Visit: Payer: Self-pay | Admitting: Primary Care

## 2015-12-09 DIAGNOSIS — N898 Other specified noninflammatory disorders of vagina: Secondary | ICD-10-CM

## 2015-12-09 LAB — WET PREP BY MOLECULAR PROBE
Candida species: NEGATIVE
Gardnerella vaginalis: NEGATIVE
Trichomonas vaginosis: NEGATIVE

## 2015-12-09 LAB — GC/CHLAMYDIA PROBE AMP
CT Probe RNA: NOT DETECTED
GC Probe RNA: NOT DETECTED

## 2015-12-09 MED ORDER — FLUCONAZOLE 150 MG PO TABS: 150.0000 mg | ORAL_TABLET | Freq: Once | ORAL | Status: DC

## 2015-12-14 ENCOUNTER — Telehealth: Payer: Self-pay | Admitting: *Deleted

## 2015-12-14 DIAGNOSIS — N898 Other specified noninflammatory disorders of vagina: Secondary | ICD-10-CM

## 2015-12-14 MED ORDER — CLOBETASOL PROPIONATE 0.05 % EX CREA: 1.0000 "application " | TOPICAL_CREAM | Freq: Every day | CUTANEOUS | Status: DC

## 2015-12-14 NOTE — Telephone Encounter (Signed)
Patient left a voicemail stating that she was seen last week and the medication she was given has not helped. Patient has scheduled an appointment with you tomorrow and wants to know what else she can do in the meantime?

## 2015-12-14 NOTE — Telephone Encounter (Signed)
Sounds like she need to be evaluated by GYN. Does she have one? If not, then I can refer her.

## 2015-12-14 NOTE — Telephone Encounter (Signed)
Called and ask the patient if she has a GYN. Patient stated she does not have on and would need a referral to one. Patient also stated that she tried some jock itch cream as well and it did not help.

## 2015-12-14 NOTE — Telephone Encounter (Signed)
Spoke with patient regarding symptoms and will trial clobetasol for possible lichen sclerosis. She will notify us if no improvement

## 2015-12-15 ENCOUNTER — Ambulatory Visit: Payer: BLUE CROSS/BLUE SHIELD | Admitting: Primary Care

## 2016-01-04 ENCOUNTER — Ambulatory Visit (INDEPENDENT_AMBULATORY_CARE_PROVIDER_SITE_OTHER): Payer: BLUE CROSS/BLUE SHIELD | Admitting: Family Medicine

## 2016-01-04 ENCOUNTER — Encounter: Payer: Self-pay | Admitting: Family Medicine

## 2016-01-04 VITALS — BP 108/76 | HR 74 | Temp 97.8°F | Wt 198.0 lb

## 2016-01-04 DIAGNOSIS — T148 Other injury of unspecified body region: Secondary | ICD-10-CM

## 2016-01-04 DIAGNOSIS — T148XXA Other injury of unspecified body region, initial encounter: Secondary | ICD-10-CM

## 2016-01-04 MED ORDER — TRAMADOL HCL 50 MG PO TABS: 50.0000 mg | ORAL_TABLET | Freq: Three times a day (TID) | ORAL | Status: DC | PRN

## 2016-01-04 NOTE — Assessment & Plan Note (Addendum)
Likely, with cuff sx but no arm drop and no sign of full tear.   Can take OTC ibuprofen with food along with tramadol for pain.  Use the home exercises- handout given to patient and discussed Is okay to use a heating pad, since hot showers prev helped with the pain some.  Update [place] as needed. We may need to get her to see Dr. Patsy Lager if not improved.

## 2016-01-04 NOTE — Patient Instructions (Signed)
Take OTC ibuprofen with food along with tramadol for pain.  Use the home exercises.  If it feels better, then okay to use a heating pad.  Take care.  Glad to see you.  Update [place] as needed. We may need to get you to see Dr. Patsy Lager.  No exercises at the gym for now.

## 2016-01-04 NOTE — Progress Notes (Signed)
Pre visit review using our clinic review tool, if applicable. No additional management support is needed unless otherwise documented below in the visit note.  She exercises most days.  She thought she pulled a muscle in her back.  Failed tx with tylenol and ibuprofen.  R sided back pain.  No L sided sx.  R upper back pain.  Pain with lifting R arm up.  She didn't have a specific injury or feel a pop.  No bruising.  She was sore afterward but that was typical, then it got worse.  No rash.  No trauma o/w. Safe at home.  Normal S/S in the hands and legs.  No anterior chest sx.    On depo shot, ie not pregnant.    Meds, vitals, and allergies reviewed.   ROS: See HPI.  Otherwise, noncontributory.  nad ncat Neck supple, normal ROM Back w/o midline pain but R shoulder with pain with ext > int rotation. no arm drop.  Normal ROM at the R elbow wrist and hand AC not ttp No bruising or rash on skin inspection.

## 2016-01-11 ENCOUNTER — Telehealth: Payer: Self-pay

## 2016-01-11 NOTE — Telephone Encounter (Signed)
Pt was seen 01/04/16 with pain in back; pain is no better (pain level now is 10) and tramadol is not helping the pain; pt request stronger pain med. Pt has appt with Dr Patsy Lager 01/18/16. [facility]. Pt does not want to pay for UC since has already been seen and have another appt 01/18/16. Pt request cb.

## 2016-01-11 NOTE — Telephone Encounter (Signed)
Patient advised. Appt scheduled with Mayra Reel on Tuesday at 10:45 am.

## 2016-01-11 NOTE — Telephone Encounter (Signed)
Needs to be rechecked.  We can't call anything else in that is stronger.  We shouldn't call anything else in if she is in that much pain.  She needs to be rechecked.

## 2016-01-12 ENCOUNTER — Encounter: Payer: Self-pay | Admitting: Primary Care

## 2016-01-12 ENCOUNTER — Ambulatory Visit (INDEPENDENT_AMBULATORY_CARE_PROVIDER_SITE_OTHER): Payer: BLUE CROSS/BLUE SHIELD | Admitting: Primary Care

## 2016-01-12 VITALS — BP 116/76 | HR 68 | Temp 98.8°F | Ht 61.75 in | Wt 196.1 lb

## 2016-01-12 DIAGNOSIS — S46811A Strain of other muscles, fascia and tendons at shoulder and upper arm level, right arm, initial encounter: Secondary | ICD-10-CM | POA: Diagnosis not present

## 2016-01-12 DIAGNOSIS — R21 Rash and other nonspecific skin eruption: Secondary | ICD-10-CM | POA: Diagnosis not present

## 2016-01-12 DIAGNOSIS — M62838 Other muscle spasm: Secondary | ICD-10-CM | POA: Diagnosis not present

## 2016-01-12 DIAGNOSIS — T148XXA Other injury of unspecified body region, initial encounter: Secondary | ICD-10-CM

## 2016-01-12 MED ORDER — METHOCARBAMOL 500 MG PO TABS: 500.0000 mg | ORAL_TABLET | Freq: Three times a day (TID) | ORAL | Status: DC | PRN

## 2016-01-12 MED ORDER — TRIAMCINOLONE ACETONIDE 0.1 % EX CREA: 1.0000 "application " | TOPICAL_CREAM | Freq: Two times a day (BID) | CUTANEOUS | Status: DC

## 2016-01-12 NOTE — Progress Notes (Signed)
Subjective:    Patient ID: Katherine Ruiz, female    DOB: 09-27-1991, 25 y.o.   MRN: [id number]  HPI  Katherine Ruiz is a 25 year old female who presents today with a chief complaint of back pain. She was evaluated on 01/04/16 with complaints of back pain/muscular pain. She had been exercising recently and believed she may have pulled a muscle. She was diagnosed with a muscle strain. She was provided with a prescription for Tramadol and home stretching exercises. She was also encouraged to see Dr. Patsy Lager if symptoms have not improved.  She called into the office on 01/11/16 reporting pain was worse and tramadol wasn't helping to reduce symptoms. She was advised to return to the office for re-evaluation due to increased level of pain. She has an appointment scheduled with Dr. Patsy Lager on 01/18/16.  Since her last visit she's been using the exercises that were provided and thinks she may have overstretched causing her pain to become worse. Her pain is worse with movement of her right upper extremity and sudden jerking movements to her neck. She does feel tightness as she's sitting still, but pain is worse with movement. Denies recent trauma or impact, numbness/tinlging. She has tried applying heat with mild improvement. Overall the Tramadol and ibuprofen has helped temporarily to ease her discomfort, but will wear off causing discomfort.  2) Rash: Located to left lateral trunk that she noticed 24 hours ago. She reports itching. Denies pain, changes in detergents, soaps, clothing, foods, etc. She reports improvement to the rash on her vagina with the clobetasol cream (lichen sclerosis).  Review of Systems  Musculoskeletal: Positive for myalgias. Negative for back pain, neck pain and neck stiffness.  Skin: Positive for rash.  Neurological: Negative for numbness.       Past Medical History  Diagnosis Date  . Asthma   . Chickenpox   . Depression   . Kidney stone   . History of UTI   . MRSA  (methicillin resistant staph aureus) culture positive     Social History   Social History  . Marital Status: Single    Spouse Name: N/A  . Number of Children: N/A  . Years of Education: N/A   Occupational History  . Not on file.   Social History Main Topics  . Smoking status: Never Smoker   . Smokeless tobacco: Not on file  . Alcohol Use: No  . Drug Use: Not on file  . Sexual Activity: Not on file   Other Topics Concern  . 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 Foot surgery Left 2014    Family History  Problem Relation Age of Onset  . Arthritis Father   . Hyperlipidemia Father   . Hypertension Father   . Mental illness Father   . Diabetes Father   . Hyperlipidemia Maternal Grandmother   . Arthritis Maternal Grandmother   . Breast cancer Maternal Grandmother   . Ovarian cancer Maternal Grandmother   . Hypertension Maternal Grandmother   . Mental illness Maternal Grandmother   . Diabetes Maternal Grandmother   . Arthritis Maternal Grandfather   . Mental illness Paternal Grandmother   . Hypertension Paternal Grandmother   . Arthritis Paternal Grandmother   . Arthritis Paternal Grandfather   .  Mental illness Paternal Grandfather   . Hypertension Paternal Grandfather     Allergies  Allergen Reactions  . Lamotrigine Hives  . Vancomycin Swelling  . Septra [Sulfamethoxazole-Trimethoprim] Rash    Current Outpatient Prescriptions on File Prior to Visit  Medication Sig Dispense Refill  . traMADol (ULTRAM) 50 MG tablet Take 1 tablet (50 mg total) by mouth every 8 (eight) hours as needed (sedation caution). 30 tablet 0   No current facility-administered medications on file prior to visit.    BP 116/76 mmHg  Pulse 68  Temp(Src) 98.8 F (37.1 C) (Oral)  Ht 5' 1.75" (1.568  m)  Wt 196 lb 1.9 oz (88.959 kg)  BMI 36.18 kg/m2  SpO2 98%    Objective:   Physical Exam  Constitutional: She appears well-nourished.  Cardiovascular: Normal rate and regular rhythm.   Pulmonary/Chest: Effort normal and breath sounds normal.  Musculoskeletal:       Back:  Muscular tenderness to right upper trapezius muscle, moderate. Pain to muscle with right upper extremity ROM, especially abduction. No shoulder discomfort.   Skin: Skin is warm. Rash noted.  Mild eczema appearing rash to left lateral chest wall along bra line. Does not appear fungal.           Assessment & Plan:  Rash:  Located to left lateral chest wall x 24 hours. No changes in detergents, soaps, clothing, foods, etc. 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 RX for Triamcinolone cream BID. Follow up PRN.

## 2016-01-12 NOTE — Assessment & Plan Note (Signed)
Located to right upper trapezius. Tenderness with muscle tension noted on exam. Decrease in ROM to RUE due to pain. RX for methocarbamol TID PRN. Continue Ibuprofen. Drowsiness precautions provided. Slowly advance exercises as tolerated. Follow up with [facility] as scheduled if no improvement.

## 2016-01-12 NOTE — Patient Instructions (Signed)
Your symptoms represent a muscle spasm to the large muscle in the back called the Trapezius muscle.  Try taking Methocarbamol muscle relaxer. Take 1 tablet by mouth three times daily as needed for spasm. Caution, this medication may make you drowsy.  You may take ibuprofen 600 mg three times daily as needed for pain.  Use the tramadol only for severe pain, but this will also make you drowsy.  Slowly work on exercises and see Dr. Patsy Lager next week as scheduled.  It was a pleasure to see you today!  Muscle Cramps and Spasms Muscle cramps and spasms occur when a muscle or muscles tighten and you have no control over this tightening (involuntary muscle contraction). They are a common problem and can develop in any muscle. The most common place is in the calf muscles of the leg. Both muscle cramps and muscle spasms are involuntary muscle contractions, but they also have differences:   Muscle cramps are sporadic and painful. They may last a few seconds to a quarter of an hour. Muscle cramps are often more forceful and last longer than muscle spasms.  Muscle spasms may or may not be painful. They may also last just a few seconds or much longer. CAUSES  It is uncommon for cramps or spasms to be due to a serious underlying problem. In many cases, the cause of cramps or spasms is unknown. Some common causes are:   Overexertion.   Overuse from repetitive motions (doing the same thing over and over).   Remaining in a certain position for a long period of time.   Improper preparation, form, or technique while performing a sport or activity.   Dehydration.   Injury.   Side effects of some medicines.   Abnormally low levels of the salts and ions in your blood (electrolytes), especially potassium and calcium. This could happen if you are taking water pills (diuretics) or you are pregnant.  Some underlying medical problems can make it more likely to develop cramps or spasms. These include,  but are not limited to:   Diabetes.   Parkinson disease.   Hormone disorders, such as thyroid problems.   Alcohol abuse.   Diseases specific to muscles, joints, and bones.   Blood vessel disease where not enough blood is getting to the muscles.  HOME CARE INSTRUCTIONS   Stay well hydrated. Drink enough water and fluids to keep your urine clear or pale yellow.  It may be helpful to massage, stretch, and relax the affected muscle.  For tight or tense muscles, use a warm towel, heating pad, or hot shower water directed to the affected area.  If you are sore or have pain after a cramp or spasm, applying ice to the affected area may relieve discomfort.  Put ice in a plastic bag.  Place a towel between your skin and the bag.  Leave the ice on for 15-20 minutes, 03-04 times a day.  Medicines used to treat a known cause of cramps or spasms may help reduce their frequency or severity. Only take over-the-counter or prescription medicines as directed by your caregiver. SEEK MEDICAL CARE IF:  Your cramps or spasms get more severe, more frequent, or do not improve over time.  MAKE SURE YOU:   Understand these instructions.  Will watch your condition.  Will get help right away if you are not doing well or get worse.   This information is not intended to replace advice given to you by your health care provider. Make sure  you discuss any questions you have with your health care provider.   Document Released: 04/22/2002 Document Revised: 02/25/2013 Document Reviewed: 10/17/2012 Elsevier Interactive Patient Education [employer]

## 2016-01-12 NOTE — Progress Notes (Signed)
Pre visit review using our clinic review tool, if applicable. No additional management support is needed unless otherwise documented below in the visit note. 

## 2016-01-18 ENCOUNTER — Ambulatory Visit: Payer: BLUE CROSS/BLUE SHIELD | Admitting: Family Medicine

## 2016-01-28 ENCOUNTER — Other Ambulatory Visit: Payer: Self-pay | Admitting: Primary Care

## 2016-01-28 DIAGNOSIS — Z0000 Encounter for general adult medical examination without abnormal findings: Secondary | ICD-10-CM

## 2016-02-05 ENCOUNTER — Other Ambulatory Visit (INDEPENDENT_AMBULATORY_CARE_PROVIDER_SITE_OTHER): Payer: BLUE CROSS/BLUE SHIELD

## 2016-02-05 DIAGNOSIS — Z0000 Encounter for general adult medical examination without abnormal findings: Secondary | ICD-10-CM

## 2016-02-05 LAB — CBC
HCT: 39.4 % (ref 36.0–46.0)
HEMOGLOBIN: 13.4 g/dL (ref 12.0–15.0)
MCHC: 34.1 g/dL (ref 30.0–36.0)
MCV: 92 fl (ref 78.0–100.0)
PLATELETS: 247 10*3/uL (ref 150.0–400.0)
RBC: 4.29 Mil/uL (ref 3.87–5.11)
RDW: 12.9 % (ref 11.5–15.5)
WBC: 9.6 10*3/uL (ref 4.0–10.5)

## 2016-02-05 LAB — LIPID PANEL
CHOL/HDL RATIO: 3
CHOLESTEROL: 138 mg/dL (ref 0–200)
HDL: 42 mg/dL (ref >39.00)
LDL Cholesterol: 80 mg/dL (ref 0–99)
NonHDL: 95.59
TRIGLYCERIDES: 78 mg/dL (ref 0.0–149.0)
VLDL: 15.6 mg/dL (ref 0.0–40.0)

## 2016-02-05 LAB — COMPREHENSIVE METABOLIC PANEL
ALBUMIN: 4.6 g/dL (ref 3.5–5.2)
ALK PHOS: 47 U/L (ref 39–117)
ALT: 22 U/L (ref 0–35)
AST: 21 U/L (ref 0–37)
BILIRUBIN TOTAL: 0.5 mg/dL (ref 0.2–1.2)
BUN: 20 mg/dL (ref 6–23)
CALCIUM: 9.8 mg/dL (ref 8.4–10.5)
CO2: 25 mEq/L (ref 19–32)
Chloride: 106 mEq/L (ref 96–112)
Creatinine, Ser: 0.94 mg/dL (ref 0.40–1.20)
GFR: 76.01 mL/min (ref >60.00)
GLUCOSE: 88 mg/dL (ref 70–99)
POTASSIUM: 4.5 meq/L (ref 3.5–5.1)
SODIUM: 139 meq/L (ref 135–145)
TOTAL PROTEIN: 7.3 g/dL (ref 6.0–8.3)

## 2016-02-05 LAB — HEMOGLOBIN A1C: HEMOGLOBIN A1C: 5.4 % (ref 4.6–6.5)

## 2016-02-05 LAB — TSH: TSH: 2.37 u[IU]/mL (ref 0.35–4.50)

## 2016-02-10 ENCOUNTER — Encounter: Payer: BLUE CROSS/BLUE SHIELD | Admitting: Primary Care

## 2016-02-11 ENCOUNTER — Encounter: Payer: BLUE CROSS/BLUE SHIELD | Admitting: Primary Care

## 2016-02-12 ENCOUNTER — Other Ambulatory Visit (HOSPITAL_COMMUNITY)
Admission: RE | Admit: 2016-02-12 | Discharge: 2016-02-12 | Disposition: A | Discharge status: Home / Self Care | Payer: BLUE CROSS/BLUE SHIELD | Source: Ambulatory Visit | Attending: Primary Care | Admitting: Primary Care

## 2016-02-12 ENCOUNTER — Ambulatory Visit (INDEPENDENT_AMBULATORY_CARE_PROVIDER_SITE_OTHER): Payer: BLUE CROSS/BLUE SHIELD | Admitting: Primary Care

## 2016-02-12 ENCOUNTER — Encounter: Payer: Self-pay | Admitting: Primary Care

## 2016-02-12 VITALS — BP 116/76 | HR 77 | Temp 98.1°F | Ht 62.0 in | Wt 196.0 lb

## 2016-02-12 DIAGNOSIS — Z01419 Encounter for gynecological examination (general) (routine) without abnormal findings: Secondary | ICD-10-CM | POA: Diagnosis present

## 2016-02-12 DIAGNOSIS — Z0000 Encounter for general adult medical examination without abnormal findings: Secondary | ICD-10-CM | POA: Diagnosis not present

## 2016-02-12 DIAGNOSIS — Z3042 Encounter for surveillance of injectable contraceptive: Secondary | ICD-10-CM | POA: Diagnosis not present

## 2016-02-12 DIAGNOSIS — Z124 Encounter for screening for malignant neoplasm of cervix: Secondary | ICD-10-CM | POA: Diagnosis not present

## 2016-02-12 DIAGNOSIS — Z1151 Encounter for screening for human papillomavirus (HPV): Secondary | ICD-10-CM | POA: Insufficient documentation

## 2016-02-12 MED ORDER — MEDROXYPROGESTERONE ACETATE 150 MG/ML IM SUSP
150.0000 mg | Freq: Once | INTRAMUSCULAR | Status: AC
  Administered 2016-02-12: 150 mg via INTRAMUSCULAR

## 2016-02-12 NOTE — Addendum Note (Signed)
Addended by: Tawnya CrookSAMBATH, Jarrick Fjeld on: 02/12/2016 08:35 AM   Modules accepted: Kipp BroodSmartSet

## 2016-02-12 NOTE — Patient Instructions (Signed)
We will notify your of your Pap results once received.  Your labs look great! No diabetes, your cholesterol is normal.  Continue your efforts towards a healthy lifestyle through diet and exercise.  Follow up in 1 year for repeat physical or sooner if needed.  It was a pleasure to see you today!

## 2016-02-12 NOTE — Addendum Note (Signed)
Addended by: Tawnya CrookSAMBATH, Loni Abdon on: 02/12/2016 08:37 AM   Modules accepted: Kipp BroodSmartSet

## 2016-02-12 NOTE — Assessment & Plan Note (Signed)
Tdap UTD per patient. No flu last season. Pap completed today and is pending. Labs unremarkable. Exam unremarkable. Discussed the importance of a healthy diet and regular exercise in order for weight loss and to reduce risk of other medical diseases.  Commended her on her efforts towards healthy lifestyle.  Follow up in 1 year for repeat physical or sooner if needed.

## 2016-02-12 NOTE — Progress Notes (Signed)
Pre visit review using our clinic review tool, if applicable. No additional management support is needed unless otherwise documented below in the visit note. 

## 2016-02-12 NOTE — Addendum Note (Signed)
Addended by: Tawnya CrookSAMBATH, Shantina Chronister on: 02/12/2016 08:14 AM   Modules accepted: Orders, SmartSet

## 2016-02-12 NOTE — Progress Notes (Signed)
Subjective:    Patient ID: Katherine Ruiz, female    DOB: 15-Jul-1991, 25 y.o.   MRN: [id number]  HPI  Katherine Ruiz is a 25 year old female who presents today for complete physical.  Immunizations: -Tetanus: Completed within 5 years. -Influenza: Declines   Diet: Endorses a healthy diet. Breakfast: Protein shake, eggs Lunch: Protein shake, salad with protein Dinner: [place], chicken, vegetables Snacks: Fruit Desserts: None Beverages: Water  Wt Readings from Last 3 Encounters:  02/12/16 196 lb (88.905 kg)  01/12/16 196 lb 1.9 oz (88.959 kg)  01/04/16 198 lb (89.812 kg)     Exercise: Work out Engineer, [employer] 6 days weekly.  Eye exam: Completed 4 years ago, due soon. Dental exam: Completed 6 months ago. Pap Smear: Has not completed in over 1 year.   Review of Systems  HENT: Negative for rhinorrhea.   Respiratory: Negative for cough and shortness of breath.   Cardiovascular: Negative for chest pain.  Gastrointestinal: Negative for diarrhea and constipation.  Genitourinary:       On Depo  Musculoskeletal: Negative for myalgias and arthralgias.  Skin: Negative for rash.  Allergic/Immunologic: Positive for environmental allergies.  Neurological: Negative for dizziness, numbness and headaches.  Psychiatric/Behavioral:       Denies concerns for anxiety and depression       Past Medical History  Diagnosis Date  . Asthma   . Chickenpox   . Depression   . Kidney stone   . History of UTI   . MRSA (methicillin resistant staph aureus) culture positive     Social History   Social History  . Marital Status: Single    Spouse Name: N/A  . Number of Children: N/A  . Years of Education: N/A   Occupational History  . Not on file.   Social History Main Topics  . Smoking status: Never Smoker   . Smokeless tobacco: Not on file  . Alcohol Use: No  . Drug Use: Not on file  . Sexual Activity: Not on file   Other Topics Concern  . 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 Foot surgery Left 2014    Family History  Problem Relation Age of Onset  . Arthritis Father   . Hyperlipidemia Father   . Hypertension Father   . Mental illness Father   . Diabetes Father   . Hyperlipidemia Maternal Grandmother   . Arthritis Maternal Grandmother   . Breast cancer Maternal Grandmother   . Ovarian cancer Maternal Grandmother   . Hypertension Maternal Grandmother   . Mental illness Maternal Grandmother   . Diabetes Maternal Grandmother   . Arthritis Maternal Grandfather   . Mental illness Paternal Grandmother   . Hypertension Paternal Grandmother   . Arthritis Paternal Grandmother   . Arthritis Paternal Grandfather   . Mental illness Paternal Grandfather   . Hypertension Paternal Grandfather     Allergies  Allergen Reactions  . Lamotrigine Hives  . Vancomycin Swelling  . Septra [Sulfamethoxazole-Trimethoprim] Rash    No current outpatient prescriptions on file prior to visit.   No current facility-administered medications on file prior to visit.    BP 116/76 mmHg  Pulse 77  Temp(Src) 98.1 F (36.7 C) (Oral)  Ht  (1.575 m)  Wt 196 lb (88.905  kg)  BMI 35.84 kg/m2  SpO2 97%    Objective:   Physical Exam  Constitutional: She is oriented to person, place, and time. She appears well-nourished.  HENT:  Right Ear: Tympanic membrane and ear canal normal.  Left Ear: Tympanic membrane and ear canal normal.  Nose: Nose normal.  Mouth/Throat: Oropharynx is clear and moist.  Eyes: Conjunctivae and EOM are normal. Pupils are equal, round, and reactive to light.  Neck: Neck supple. No thyromegaly present.  Cardiovascular: Normal rate and regular rhythm.   No murmur heard. Pulmonary/Chest: Effort normal and breath sounds normal. She has no rales.  Abdominal: Soft.  Bowel sounds are normal. There is no tenderness.  Genitourinary: Vagina normal. Cervix exhibits discharge. Cervix exhibits no motion tenderness. Right adnexum displays no tenderness. Left adnexum displays no tenderness.  Small amount of whitish discharge. No foul odor.  Musculoskeletal: Normal range of motion.  Lymphadenopathy:    She has no cervical adenopathy.  Neurological: She is alert and oriented to person, place, and time. She has normal reflexes. No cranial nerve deficit.  Skin: Skin is warm and dry. No rash noted.  Psychiatric: She has a normal mood and affect.          Assessment & Plan:

## 2016-02-15 LAB — CYTOLOGY - PAP

## 2016-02-16 ENCOUNTER — Encounter: Payer: Self-pay | Admitting: *Deleted

## 2016-02-29 ENCOUNTER — Telehealth: Payer: Self-pay

## 2016-02-29 NOTE — Telephone Encounter (Signed)
Noted. Please have her continue to monitor these symptoms, if no improvement in 1 week then I need to be notified. Also, may want to consider another form of birth control if she's itching as this is not considered a normal side effect of the medication.

## 2016-02-29 NOTE — Telephone Encounter (Signed)
Pt left v/m; pt having itching under arms and genitalia area; pt has noticed has itching about 1 week after getting depo shot; pt not sure if related to depo shot or possibly hormonal.pt request cb. Pt last seen 02/12/2016.

## 2016-02-29 NOTE — Telephone Encounter (Signed)
Called and spoken to patient.  No, she have not ever experience this reaction after a Depo injection.  Patient did not have insurance the first part of last year and missed 2 doses.  Both times it happen 1 week after getting the Depo.  No changes in soaps/detergents.  Patient is current taking Zyrtec and patient wanted Jae DireKate to know that the cream, Jae DireKate prescribed did help.  Please advise.

## 2016-02-29 NOTE — Telephone Encounter (Signed)
Has she ever experienced this reaction after a Depo injection before?  Any rash?  How long has she been itching? 1 week since Depo injection, or 1 week after getting the Depo injection? Any changes in soaps/detergents?  I would recommend she try a non drowsy antihistamine like Zyrtec once daily at bedtime for itching if no rash.  Please have her notify us if no improvement or if symptoms progress, especially if she notices tightness to her throat or shortness of breath.

## 2016-03-01 NOTE — Telephone Encounter (Signed)
Called and notified patient of Katherine Ruiz's comments. Patient verbalized understanding.  

## 2016-03-14 NOTE — Telephone Encounter (Addendum)
Pt left v/m; pt continues with itching and pt is out of cream now; pt wants to know if needs to be seen or what to do? [facility]

## 2016-03-14 NOTE — Telephone Encounter (Signed)
Left detail message per DPR, for patient to call back and schedule office visit.

## 2016-03-14 NOTE — Telephone Encounter (Signed)
If she's tried taking an antihistamine (Zyrtec, allegra, claritin) without improvement and she has no rash, then I will need to see her in the office.

## 2016-03-15 ENCOUNTER — Ambulatory Visit (INDEPENDENT_AMBULATORY_CARE_PROVIDER_SITE_OTHER): Payer: BLUE CROSS/BLUE SHIELD | Admitting: Primary Care

## 2016-03-15 VITALS — BP 118/74 | HR 79 | Temp 97.9°F | Ht 62.0 in | Wt 199.8 lb

## 2016-03-15 DIAGNOSIS — L299 Pruritus, unspecified: Secondary | ICD-10-CM

## 2016-03-15 MED ORDER — HYDROXYZINE HCL 25 MG PO TABS: 25.0000 mg | ORAL_TABLET | Freq: Three times a day (TID) | ORAL | Status: DC | PRN

## 2016-03-15 NOTE — Progress Notes (Signed)
Pre visit review using our clinic review tool, if applicable. No additional management support is needed unless otherwise documented below in the visit note. 

## 2016-03-15 NOTE — Patient Instructions (Signed)
Start Hydroxyzine 25 mg tablets for itching. Take 1 tablet by mouth three times daily as needed for itching. Caution as this may cause drowsiness.  Please notify me if no improvement in itching within 2-3 days.  It was a pleasure to see you today!

## 2016-03-15 NOTE — Progress Notes (Signed)
Subjective:    Patient ID: Katherine Ruiz, female    DOB: 03-Oct-1991, 24 y.o.   MRN: [id number]  HPI  Katherine Ruiz is a 25 year old female who presents today with a chief complaint of pruritus. She is itching to her bilateral axilla and groin which has been present since 3-4 days after she received her Depo Provera injection. This also occurred during her last 2 Depo Provera injections. She denies rash, open wounds, vaginal discharge, vaginal bumps/rash, shortness of breath. She's been taking Zyrtec every morning and Benadryl at night for allergic rhinitis without improvement in itching.   Review of Systems  HENT: Negative for trouble swallowing.   Respiratory: Negative for shortness of breath.   Skin: Negative for color change and rash.       Pruritis        Past Medical History  Diagnosis Date  . Asthma   . Chickenpox   . Depression   . Kidney stone   . History of UTI   . MRSA (methicillin resistant staph aureus) culture positive      Social History   Social History  . Marital Status: Single    Spouse Name: N/A  . Number of Children: N/A  . Years of Education: N/A   Occupational History  . Not on file.   Social History Main Topics  . Smoking status: Never Smoker   . Smokeless tobacco: Not on file  . Alcohol Use: No  . Drug Use: Not on file  . Sexual Activity: Not on file   Other Topics Concern  . 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 Foot surgery Left 2014    Family History  Problem Relation Age of Onset  . Arthritis Father   . Hyperlipidemia Father   . Hypertension Father   . Mental illness Father   . Diabetes Father   . Hyperlipidemia Maternal Grandmother   . Arthritis Maternal Grandmother   . Breast cancer Maternal Grandmother   . Ovarian cancer Maternal  Grandmother   . Hypertension Maternal Grandmother   . Mental illness Maternal Grandmother   . Diabetes Maternal Grandmother   . Arthritis Maternal Grandfather   . Mental illness Paternal Grandmother   . Hypertension Paternal Grandmother   . Arthritis Paternal Grandmother   . Arthritis Paternal Grandfather   . Mental illness Paternal Grandfather   . Hypertension Paternal Grandfather     Allergies  Allergen Reactions  . Lamotrigine Hives  . Vancomycin Swelling  . Septra [Sulfamethoxazole-Trimethoprim] Rash    No current outpatient prescriptions on file prior to visit.   No current facility-administered medications on file prior to visit.    BP 118/74 mmHg  Pulse 79  Temp(Src) 97.9 F (36.6 C) (Oral)  Ht  (1.575 m)  Wt 199 lb 12.8 oz (90.629 kg)  BMI 36.53 kg/m2  SpO2 98%     Objective:   Physical Exam  Constitutional: She appears well-nourished.  Cardiovascular: Normal rate and regular rhythm.   Pulmonary/Chest: Effort normal and breath sounds normal.  Skin: Skin is warm and dry. No rash noted.          Assessment & Plan:  Pruritus:  Present 3-4 days after recent Depo Provera injection. Also occurred after several Depo injections.  She will no longer receive Depo injections. Discussed various options for birth control and she elects for Mirena. She will notify us if she requires a referral to GYN as she will attempt to set this up herself. No visible rash or lesions noted during exam. No evidence of anaphylaxis.  Will have her try Hydroxyzine TID PRN with drowsiness precautions. Stop benadryl HS. Follow up PRN.

## 2016-05-23 ENCOUNTER — Encounter: Payer: Self-pay | Admitting: Obstetrics & Gynecology

## 2016-05-23 ENCOUNTER — Ambulatory Visit (INDEPENDENT_AMBULATORY_CARE_PROVIDER_SITE_OTHER): Payer: BLUE CROSS/BLUE SHIELD | Admitting: Obstetrics & Gynecology

## 2016-05-23 VITALS — BP 127/68 | HR 78 | Resp 18 | Ht 61.0 in | Wt 205.0 lb

## 2016-05-23 DIAGNOSIS — Z30430 Encounter for insertion of intrauterine contraceptive device: Secondary | ICD-10-CM

## 2016-05-23 DIAGNOSIS — Z975 Presence of (intrauterine) contraceptive device: Secondary | ICD-10-CM | POA: Insufficient documentation

## 2016-05-23 DIAGNOSIS — Z01812 Encounter for preprocedural laboratory examination: Secondary | ICD-10-CM

## 2016-05-23 DIAGNOSIS — T8384XA Pain from genitourinary prosthetic devices, implants and grafts, initial encounter: Secondary | ICD-10-CM

## 2016-05-23 LAB — POCT URINE PREGNANCY: Preg Test, Ur: NEGATIVE

## 2016-05-23 MED ORDER — IBUPROFEN 800 MG PO TABS: 800.0000 mg | ORAL_TABLET | Freq: Three times a day (TID) | ORAL | Status: DC | PRN

## 2016-05-23 MED ORDER — LEVONORGESTREL 18.6 MCG/DAY IU IUD
INTRAUTERINE_SYSTEM | Freq: Once | INTRAUTERINE | Status: AC
  Administered 2016-05-23: 15:00:00 via INTRAUTERINE

## 2016-05-23 NOTE — Addendum Note (Signed)
Addended by: Gita KudoLASSITER, KRISTEN S on: 05/23/2016 03:54 PM   Modules accepted: Medications

## 2016-05-23 NOTE — Progress Notes (Signed)
    GYNECOLOGY CLINIC PROCEDURE NOTE  Katherine Ruiz is a 25 y.o. [phone] here for Liletta IUD insertion. No GYN concerns.  Last pap smear was on 02/12/2016 and was normal.  IUD Insertion Procedure Note Patient identified, informed consent performed, consent signed.   Discussed risks of irregular bleeding, cramping, infection, malpositioning or misplacement of the IUD outside the uterus which may require further procedure such as laparoscopy. Time out was performed.  Urine pregnancy test negative.  Speculum placed in the vagina.  Cervix visualized.  Cleaned with Betadine x 2.  Grasped anteriorly with a single tooth tenaculum.  Uterus sounded to 7 cm.  Liletta IUD placed per manufacturer's recommendations without difficulty.  Strings trimmed to 3 cm. Tenaculum was removed, good hemostasis noted.  Patient was crying a lot after placement, she was very anxious throughout procedure. Was given ibuprofen after insertion.   Given that she was crying a lot and seemed to be in significant pain, she was concerned about correct intrauterine placement. Bedside ultrasound was done and reassured patient of correct fundal position of the inserted IUD. Ibuprofen prescribed as needed for pain; also encouraged to Tylenol if needed.   Patient was given post-procedure instructions.  She was advised to have backup contraception for one week. Patient was also asked to check IUD strings periodically and follow up in 4 weeks for IUD check or earlier if needed.   [name]  Jkai Arwood, MD, FACOG Attending Obstetrician & Gynecologist, [facility]

## 2016-05-23 NOTE — Patient Instructions (Signed)

## 2016-05-24 ENCOUNTER — Encounter: Payer: Self-pay | Admitting: *Deleted

## 2016-05-25 ENCOUNTER — Ambulatory Visit: Payer: BLUE CROSS/BLUE SHIELD | Admitting: Obstetrics & Gynecology

## 2016-05-25 ENCOUNTER — Ambulatory Visit (INDEPENDENT_AMBULATORY_CARE_PROVIDER_SITE_OTHER): Payer: BLUE CROSS/BLUE SHIELD | Admitting: Obstetrics & Gynecology

## 2016-05-25 ENCOUNTER — Telehealth: Payer: Self-pay | Admitting: *Deleted

## 2016-05-25 VITALS — BP 131/84 | HR 74 | Temp 98.4°F

## 2016-05-25 DIAGNOSIS — Z30432 Encounter for removal of intrauterine contraceptive device: Secondary | ICD-10-CM | POA: Diagnosis not present

## 2016-05-25 DIAGNOSIS — T8384XA Pain from genitourinary prosthetic devices, implants and grafts, initial encounter: Secondary | ICD-10-CM | POA: Diagnosis not present

## 2016-05-25 DIAGNOSIS — Z3042 Encounter for surveillance of injectable contraceptive: Secondary | ICD-10-CM

## 2016-05-25 DIAGNOSIS — R102 Pelvic and perineal pain: Secondary | ICD-10-CM

## 2016-05-25 MED ORDER — PREDNISONE 20 MG PO TABS: 40.0000 mg | ORAL_TABLET | Freq: Every day | ORAL | Status: DC

## 2016-05-25 MED ORDER — MEDROXYPROGESTERONE ACETATE 150 MG/ML IM SUSP
150.0000 mg | Freq: Once | INTRAMUSCULAR | Status: AC
  Administered 2016-05-25: 150 mg via INTRAMUSCULAR

## 2016-05-25 MED ORDER — TRIAMCINOLONE ACETONIDE 0.5 % EX OINT: 1.0000 "application " | TOPICAL_OINTMENT | Freq: Two times a day (BID) | CUTANEOUS | Status: DC

## 2016-05-25 MED ORDER — TRAMADOL HCL 50 MG PO TABS: 50.0000 mg | ORAL_TABLET | Freq: Four times a day (QID) | ORAL | Status: DC | PRN

## 2016-05-25 NOTE — Telephone Encounter (Signed)
Pt called and left a message stating she was still having a lot of pain since IUD placement. Explained to pt that pain after insertion is normal and should ease up in a week or so. IUD was confirmed by US to be in place after insertion. Pt states she has been alternating Tylenol and Ibuprofen but might not be as consistent as she should be. Advised pt to alternate medications as directed for at least a week even if not in a lot of pain, apply heat to abdomen, rest, hydrate, and call back on Monday if she continues to have pain. Advised pt to call if pain worsens or excessive vaginal bleeding occurs.

## 2016-05-25 NOTE — Progress Notes (Signed)
    GYNECOLOGY CLINIC PROCEDURE NOTE  Katherine Ruiz is a 25 y.o. [id number] here for Liletta IUD removal. She had significant pain since placement two days ago; feels it is debilitating and interferes with her ADLs. She is crying out in pain, requesting narcotic medications. No bleeding, nausea, vomiting, fever or other concerns.    IUD Removal  Patient identified, informed consent performed, consent signed.  Patient was in the dorsal lithotomy position, normal external genitalia was noted.  A speculum was placed in the patient's vagina, normal discharge was noted, no lesions. The cervix was visualized, no lesions, no abnormal discharge.  The strings of the IUD were grasped and pulled using ring forceps. The IUD was removed in its entirety without any difficulty. Patient tolerated the procedure well.    Patient will use Depo Provera for contraception; Triamcinolone cream and steroid burst prescribed given report of occasional rash after Depo Provera administration (has used for many years).  She [facility] 3 months for next dose.  Routine preventative health maintenance measures emphasized.   [name]  Leandrew Keech, MD, FACOG Attending Obstetrician & Gynecologist, [facility]

## 2016-05-25 NOTE — Telephone Encounter (Signed)
Pt had IUD inserted on 05-23-16.  Pt has been having severe abdominal pain since the IUD was inserted.  Placement was checked in the office after the procedure due to pt adverse reaction and level of pain.  IUD appeared in place.  Instructed to take Ibuprofen and give it time to adjust.  Now at day 3 pt is still reporting severe pain and would like it removed.  Do not have a physician here in the office, spoke to Dr Macon LargeAnyanwu and [facility]  Called rx to pharmacy per Dr Macon LargeAnyanwu order.

## 2016-05-25 NOTE — Patient Instructions (Signed)
Return to clinic for any scheduled appointments or for any gynecologic concerns as needed.   

## 2016-05-26 ENCOUNTER — Ambulatory Visit: Payer: BLUE CROSS/BLUE SHIELD | Admitting: Obstetrics & Gynecology

## 2016-05-26 ENCOUNTER — Encounter: Payer: Self-pay | Admitting: General Practice

## 2016-06-20 ENCOUNTER — Ambulatory Visit: Payer: BLUE CROSS/BLUE SHIELD | Admitting: Obstetrics & Gynecology

## 2016-07-11 ENCOUNTER — Ambulatory Visit (INDEPENDENT_AMBULATORY_CARE_PROVIDER_SITE_OTHER): Payer: BLUE CROSS/BLUE SHIELD | Admitting: Primary Care

## 2016-07-11 ENCOUNTER — Encounter: Payer: Self-pay | Admitting: Primary Care

## 2016-07-11 ENCOUNTER — Emergency Department: Payer: BLUE CROSS/BLUE SHIELD

## 2016-07-11 ENCOUNTER — Encounter: Payer: Self-pay | Admitting: Emergency Medicine

## 2016-07-11 ENCOUNTER — Telehealth: Payer: Self-pay

## 2016-07-11 ENCOUNTER — Emergency Department
Admission: EM | Admit: 2016-07-11 | Discharge: 2016-07-11 | Disposition: A | Discharge status: Home / Self Care | Payer: BLUE CROSS/BLUE SHIELD | Attending: Emergency Medicine | Admitting: Emergency Medicine

## 2016-07-11 VITALS — BP 118/74 | HR 80 | Temp 98.0°F | Ht 62.0 in | Wt 208.4 lb

## 2016-07-11 DIAGNOSIS — R102 Pelvic and perineal pain: Secondary | ICD-10-CM | POA: Diagnosis present

## 2016-07-11 DIAGNOSIS — N390 Urinary tract infection, site not specified: Secondary | ICD-10-CM | POA: Insufficient documentation

## 2016-07-11 DIAGNOSIS — R1030 Lower abdominal pain, unspecified: Secondary | ICD-10-CM

## 2016-07-11 DIAGNOSIS — J45909 Unspecified asthma, uncomplicated: Secondary | ICD-10-CM | POA: Insufficient documentation

## 2016-07-11 DIAGNOSIS — R319 Hematuria, unspecified: Secondary | ICD-10-CM | POA: Diagnosis not present

## 2016-07-11 LAB — URINALYSIS COMPLETE WITH MICROSCOPIC (ARMC ONLY)
BACTERIA UA: NONE SEEN
BILIRUBIN URINE: NEGATIVE
GLUCOSE, UA: NEGATIVE mg/dL
Ketones, ur: NEGATIVE mg/dL
NITRITE: POSITIVE — AB
Protein, ur: 30 mg/dL — AB
SPECIFIC GRAVITY, URINE: 1.013 (ref 1.005–1.030)
pH: 5 (ref 5.0–8.0)

## 2016-07-11 LAB — POC URINALSYSI DIPSTICK (AUTOMATED)
Bilirubin, UA: NEGATIVE
GLUCOSE UA: NEGATIVE
Ketones, UA: NEGATIVE
NITRITE UA: NEGATIVE
Protein, UA: NEGATIVE
SPEC GRAV UA: 1.015
UROBILINOGEN UA: NEGATIVE
pH, UA: 6

## 2016-07-11 LAB — PREGNANCY, URINE: PREG TEST UR: NEGATIVE

## 2016-07-11 MED ORDER — PHENAZOPYRIDINE HCL 200 MG PO TABS: 200.0000 mg | ORAL_TABLET | Freq: Three times a day (TID) | ORAL | 0 refills | Status: DC | PRN

## 2016-07-11 MED ORDER — OXYCODONE-ACETAMINOPHEN 5-325 MG PO TABS
2.0000 | ORAL_TABLET | Freq: Once | ORAL | Status: AC
  Administered 2016-07-11: 2 via ORAL

## 2016-07-11 MED ORDER — CIPROFLOXACIN HCL 500 MG PO TABS: 500.0000 mg | ORAL_TABLET | Freq: Two times a day (BID) | ORAL | 0 refills | Status: AC

## 2016-07-11 MED ORDER — CEPHALEXIN 500 MG PO CAPS: 500.0000 mg | ORAL_CAPSULE | Freq: Two times a day (BID) | ORAL | 0 refills | Status: DC

## 2016-07-11 MED ORDER — CEFTRIAXONE SODIUM 1 G IJ SOLR
1.0000 g | INTRAMUSCULAR | Status: DC
  Administered 2016-07-11: 1 g via INTRAMUSCULAR

## 2016-07-11 MED ORDER — OXYCODONE-ACETAMINOPHEN 5-325 MG PO TABS
ORAL_TABLET | ORAL | Status: AC
  Administered 2016-07-11: 2 via ORAL
  Filled 2016-07-11: qty 2

## 2016-07-11 MED ORDER — FLUCONAZOLE 150 MG PO TABS: 150.0000 mg | ORAL_TABLET | Freq: Every day | ORAL | 1 refills | Status: DC

## 2016-07-11 MED ORDER — CEFTRIAXONE SODIUM 1 G IJ SOLR
INTRAMUSCULAR | Status: AC
  Filled 2016-07-11: qty 10

## 2016-07-11 MED ORDER — HYDROMORPHONE HCL 1 MG/ML IJ SOLN
1.0000 mg | Freq: Once | INTRAMUSCULAR | Status: AC
  Administered 2016-07-11: 1 mg via INTRAMUSCULAR

## 2016-07-11 MED ORDER — PHENAZOPYRIDINE HCL 200 MG PO TABS
ORAL_TABLET | ORAL | Status: AC
  Administered 2016-07-11: 200 mg via ORAL
  Filled 2016-07-11: qty 1

## 2016-07-11 MED ORDER — HYDROMORPHONE HCL 1 MG/ML IJ SOLN
INTRAMUSCULAR | Status: AC
  Administered 2016-07-11: 1 mg via INTRAMUSCULAR
  Filled 2016-07-11: qty 1

## 2016-07-11 MED ORDER — PHENAZOPYRIDINE HCL 200 MG PO TABS
200.0000 mg | ORAL_TABLET | Freq: Once | ORAL | Status: AC
  Administered 2016-07-11: 200 mg via ORAL
  Filled 2016-07-11: qty 1

## 2016-07-11 NOTE — Telephone Encounter (Signed)
Pt left message that ins will cover name brand pyridium; [facility] and when they tried to run namebrand Pyridium thru was not covered and does not have name brand in stock. Notified pt and she said she has been taking azo OTC since 07/09/16 and that has the same med in it as generic pyridium. Ins did not offer any other med besides name brand Pyridium. Pt request [facility] to [facility]. Pt request cb.

## 2016-07-11 NOTE — Telephone Encounter (Signed)
Pt was told by pharmacy that ins did not approve pyridium and pt request different med sent to [facility]; advised pt to contact ins co and see what substitute med would be approved by ins and cb with information. Pt voiced understanding. FYI to Mayra ReelKate Clark NP.

## 2016-07-11 NOTE — Progress Notes (Signed)
Subjective:    Patient ID: Katherine Ruiz, female    DOB: [birth year], 25 y.o.   MRN: [id number]  HPI  Katherine Ruiz is a 25 year old female who presents today with a chief complaint of urinary frequency. She also report pelvic pressure, low back pain, suprapubic pressure, fatigue. She's taken AZO and tylenol without improvement. Denies fevers, chills, hematuria, vaginal discharge, vaginal itching. She has a history of kidney stones with her last being in October 2015. She's been increasing her consumption of water and Cranberry Juice.  Review of Systems  Constitutional: Positive for fatigue. Negative for chills and fever.  Genitourinary: Positive for dysuria, flank pain, frequency and pelvic pain. Negative for hematuria and vaginal discharge.       Past Medical History:  Diagnosis Date  . Asthma   . Chickenpox   . Depression   . History of UTI   . Kidney stone   . MRSA (methicillin resistant staph aureus) culture positive      Social History   Social History  . Marital status: Single    Spouse name: N/A  . Number of children: N/A  . Years of education: N/A   Occupational History  . Not on file.   Social History Main Topics  . Smoking status: Never Smoker  . Smokeless tobacco: Not on file  . Alcohol use No  . Drug use: No  . Sexual activity: Not Currently   Other Topics Concern  . 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 FOOT SURGERY Left 2014    Family History  Problem Relation Age of Onset  . Arthritis Father   . Hyperlipidemia Father   . Hypertension Father   . Mental illness Father   . Diabetes Father   . Hyperlipidemia Maternal Grandmother   . Arthritis Maternal Grandmother   . Breast cancer Maternal Grandmother   . Ovarian cancer Maternal Grandmother   .  Hypertension Maternal Grandmother   . Mental illness Maternal Grandmother   . Diabetes Maternal Grandmother   . Cancer Maternal Grandmother 50    Breast  . Arthritis Maternal Grandfather   . Mental illness Paternal Grandmother   . Hypertension Paternal Grandmother   . Arthritis Paternal Grandmother   . Arthritis Paternal Grandfather   . Mental illness Paternal Grandfather   . Hypertension Paternal Grandfather     Allergies  Allergen Reactions  . Lamotrigine Hives  . Vancomycin Swelling  . Septra [Sulfamethoxazole-Trimethoprim] Rash    No current outpatient prescriptions on file prior to visit.   No current facility-administered medications on file prior to visit.     BP 118/74   Pulse 80   Temp 98 F (36.7 C) (Oral)   Ht 5\' 2"  (1.575 m)   Wt 208 lb 6.4 oz (94.5 kg)   SpO2 99%   BMI 38.12 kg/m    Objective:   Physical Exam  Constitutional: She appears well-nourished.  Neck: Neck supple.  Cardiovascular: Normal rate and regular rhythm.   Pulmonary/Chest: Effort normal and breath sounds normal.  Abdominal: There is no CVA tenderness.  Skin: Skin is warm and dry.          Assessment & Plan:  Urinary Frequency:  Also with pelvic pressure, low back pain, right groin pain  x 2 days. History of kidney stones.  No improvement with AZO and tylenol. UA: 2 + leuks, 1+ blood. Negative nitrites.  Culture sent. Rx for Cephalexin course and pyridium sent to pharmacy. Fluids, rest, follow up PRN.  Morrie Sheldonlark,Karimah Winquist Kendal, NP

## 2016-07-11 NOTE — Progress Notes (Signed)
Pre visit review using our clinic review tool, if applicable. No additional management support is needed unless otherwise documented below in the visit note. 

## 2016-07-11 NOTE — ED Provider Notes (Signed)
[facility] Emergency Department Provider Note        Time seen: ----------------------------------------- 7:22 PM on 07/11/2016 -----------------------------------------    I have reviewed the triage vital signs and the nursing notes.   HISTORY  Chief Complaint Urinary Tract Infection; Pelvic Pain; and Flank Pain    HPI Katherine Ruiz is a 25 y.o. female who presents to the ER reports of urinary tract infection. She was diagnosed by her primary care doctor with UTI today started on Keflex. She's been taking Azo for her symptoms without any improvement. Patient reports suprapubic pain has increased and she is having some right-sided low back pain as well and is concerned this is a kidney stone. She denies fevers or other complaints.   Past Medical History:  Diagnosis Date  . Asthma   . Chickenpox   . Depression   . History of UTI   . Kidney stone   . MRSA (methicillin resistant staph aureus) culture positive     Patient Active Problem List   Diagnosis Date Noted  . Major depressive disorder (HCC) 11/20/2015    Past Surgical History:  Procedure Laterality Date  . FOOT SURGERY Left 2014    Allergies Lamotrigine; Vancomycin; and Septra [sulfamethoxazole-trimethoprim]  Social History Social History  Substance Use Topics  . Smoking status: Never Smoker  . Smokeless tobacco: Not on file  . Alcohol use No    Review of Systems Constitutional: Negative for fever. Cardiovascular: Negative for chest pain. Respiratory: Negative for shortness of breath. Gastrointestinal: Negative for abdominal pain, vomiting and diarrhea. Genitourinary: Positive for dysuria Musculoskeletal: Positive for back pain Skin: Negative for rash. Neurological: Negative for headaches, focal weakness or numbness.  10-point ROS otherwise negative.  ____________________________________________   PHYSICAL EXAM:  VITAL SIGNS: ED Triage Vitals  Enc Vitals Group    BP 07/11/16 1757 123/83     Pulse Rate 07/11/16 1757 75     Resp 07/11/16 1757 18     Temp 07/11/16 1757 98.3 F (36.8 C)     Temp Source 07/11/16 1757 Oral     SpO2 07/11/16 1757 100 %     Weight 07/11/16 1757 208 lb (94.3 kg)     Height 07/11/16 1757 5\' 2"  (1.575 m)     Head Circumference --      Peak Flow --      Pain Score 07/11/16 1758 9     Pain Loc --      Pain Edu? --      Excl. in GC? --     Constitutional: Alert and oriented. Well appearing and in no distress. Eyes: Conjunctivae are normal. PERRL. Normal extraocular movements. ENT   Head: Normocephalic and atraumatic.   Nose: No congestion/rhinnorhea.   Mouth/Throat: Mucous membranes are moist.   Neck: No stridor. Cardiovascular: Normal rate, regular rhythm. No murmurs, rubs, or gallops. Respiratory: Normal respiratory effort without tachypnea nor retractions. Breath sounds are clear and equal bilaterally. No wheezes/rales/rhonchi. Gastrointestinal: Soft and nontender. Normal bowel sounds, possible CVA tenderness, suprapubic tenderness is present Musculoskeletal: Nontender with normal range of motion in all extremities. No lower extremity tenderness nor edema. Neurologic:  Normal speech and language. No gross focal neurologic deficits are appreciated.  Skin:  Skin is warm, dry and intact. No rash noted. Psychiatric: Mood and affect are normal. Speech and behavior are normal.  ____________________________________________  ED COURSE:  Pertinent labs & imaging results that were available during my care of the patient were reviewed by me and considered  in my medical decision making (see chart for details). Clinical Course  Patient is in no distress, will assess for UTI versus renal colic.  Procedures ____________________________________________   LABS (pertinent positives/negatives)  Labs Reviewed  URINALYSIS COMPLETEWITH MICROSCOPIC ([facility] ONLY) - Abnormal; Notable for the following:       Result  Value   Color, Urine AMBER (*)    APPearance HAZY (*)    Hgb urine dipstick 2+ (*)    Protein, ur 30 (*)    Nitrite POSITIVE (*)    Leukocytes, UA 2+ (*)    Squamous Epithelial / LPF 0-5 (*)    All other components within normal limits  URINE CULTURE  PREGNANCY, URINE    RADIOLOGY Images were viewed by me  CT renal protocol IMPRESSION: No renal or ureteral calculus.  No hydronephrosis.  Appendix appears normal.  No bowel obstruction.  No abscess.  Prominent liver without focal lesion evident. Minimal ventral hernia containing only fat.  ____________________________________________  FINAL ASSESSMENT AND PLAN  Cystitis  Plan: Patient with labs and imaging as dictated above. Patient is in no distress, CT was negative for evidence of pyelonephritis or renal colic. She will receive an IM shot of Rocephin, will encourage changing her antibiotics to Cipro. She is stable for outpatient follow-up.   Emily Filbert, MD   Note: This dictation was prepared with Dragon dictation. Any transcriptional errors that result from this process are unintentional    Emily Filbert, MD 07/11/16 2021

## 2016-07-11 NOTE — ED Notes (Signed)
Pt discharged to home.  Family member driving.  Discharge instructions reviewed.  Verbalized understanding.  No questions or concerns at this time.  Teach back verified.  Pt in NAD.  No items left in ED.   

## 2016-07-11 NOTE — Addendum Note (Signed)
Addended by: Tawnya CrookSAMBATH, Jessi Jessop on: 07/11/2016 11:07 AM   Modules accepted: Orders

## 2016-07-11 NOTE — ED Triage Notes (Signed)
Pt presents with reports of UTI. Pt states PCP diagnosed her with UTI today. Pt states was given keflex. Pt reports pain has increased and is worrried that she has a kidney stone.

## 2016-07-11 NOTE — Telephone Encounter (Signed)
Spoken and notified patient of Kate's comments. Patient verbalized understanding. 

## 2016-07-11 NOTE — Patient Instructions (Signed)
Start Cephalexin antibiotics. Take 1 capsule by mouth twice daily for 7 days for urinary tract infection.  Start pyridium tablets for symptoms of pelvic pressure, burning, frequency. Take 1 tablet by mouth every eight hours as needed.  Ensure you are staying hydrated with water.   Please call me if no improvement in 3-4 days.  It was a pleasure to see you today!

## 2016-07-11 NOTE — Telephone Encounter (Signed)
Please have patient continue AZO along with antibiotics will provide reduction in symptoms.

## 2016-07-13 LAB — URINE CULTURE
Culture: <10000 — AB
Special Requests: NORMAL

## 2016-12-08 ENCOUNTER — Telehealth: Payer: Self-pay | Admitting: Primary Care

## 2016-12-08 ENCOUNTER — Ambulatory Visit (INDEPENDENT_AMBULATORY_CARE_PROVIDER_SITE_OTHER): Payer: BLUE CROSS/BLUE SHIELD | Admitting: Family Medicine

## 2016-12-08 ENCOUNTER — Encounter: Payer: Self-pay | Admitting: Family Medicine

## 2016-12-08 VITALS — BP 110/80 | HR 79 | Temp 98.2°F | Ht 62.0 in | Wt 207.1 lb

## 2016-12-08 DIAGNOSIS — N912 Amenorrhea, unspecified: Secondary | ICD-10-CM

## 2016-12-08 DIAGNOSIS — K5900 Constipation, unspecified: Secondary | ICD-10-CM

## 2016-12-08 DIAGNOSIS — R109 Unspecified abdominal pain: Secondary | ICD-10-CM | POA: Diagnosis not present

## 2016-12-08 LAB — POCT URINALYSIS DIPSTICK
BILIRUBIN UA: NEGATIVE
Blood, UA: NEGATIVE
GLUCOSE UA: NEGATIVE
KETONES UA: NEGATIVE
LEUKOCYTES UA: NEGATIVE
Nitrite, UA: NEGATIVE
PROTEIN UA: NEGATIVE
SPEC GRAV UA: 1.02
Urobilinogen, UA: 0.2
pH, UA: 5

## 2016-12-08 LAB — POCT URINE PREGNANCY: Preg Test, Ur: NEGATIVE

## 2016-12-08 NOTE — Progress Notes (Signed)
Pre visit review using our clinic review tool, if applicable. No additional management support is needed unless otherwise documented below in the visit note. 

## 2016-12-08 NOTE — Telephone Encounter (Signed)
[facility] - Client TELEPHONE ADVICE RECORD [facility] Patient Name: Katherine JordanABITHA ZOLA DOB: 03-Sep-1991 Initial Comment Caller says that she has abdominal and back pain. Says that it gets worse when she eat. In the middle of eating shell feel really bad cramps. Hasn't had a period since august. Nurse Assessment Nurse: Lane HackerHarley, RN, Elvin SoWindy Date/Time Lamount Cohen(Eastern Time): 12/08/2016 9:15:25 AM Confirm and document reason for call. If symptomatic, describe symptoms. ---Caller states that she has lower abdominal and lower back pain. It gets worse when she eat. In the middle of eating shell feel really bad cramps. S/S for 2-3 wks that she has actually paid attention to it. Hasn't had a period since August. She is not pregnant. Does the patient have any new or worsening symptoms? ---Yes Will a triage be completed? ---Yes Related visit to physician within the last 2 weeks? ---Yes Does the PT have any chronic conditions? (i.e. diabetes, asthma, etc.) ---Yes List chronic conditions. ---asthma Is the patient pregnant or possibly pregnant? (Ask all females between the ages of 2212-55) ---No Is this a behavioral health or substance abuse call? ---No Guidelines Guideline Title Affirmed Question Affirmed Notes Abdominal Pain - Female [1] MILD-MODERATE pain AND [2] constant AND [3] present > 2 hours Final Disposition User See Physician within 4 Hours (or PCP triage) Lane HackerHarley, RN, Elvin SoWindy Comments Rates 5-6/10 and has been nauseated. No available appts at office or [place]. Appt made with Kriste BasqueHannah Kim at 11:15 am [facility]. Referrals REFERRED TO PCP OFFICE Disagree/Comply: Comply

## 2016-12-08 NOTE — Patient Instructions (Signed)
Fiber supplement such as metameucil or citracel in water every morning before breakfast.  Mirilax once daily in the morning for 5 days, then as needed if any hard stools or constipation.  Follow up with your doctor in 1 week.  Seek care immediately if worsening or new concerns.

## 2016-12-08 NOTE — Telephone Encounter (Signed)
Pt has appt with Dr.Hannah Selena BattenKim 12/08/16 at 11:15.

## 2016-12-08 NOTE — Progress Notes (Signed)
HPI:  Katherine Ruiz is a pleasant 26 yo with a PMH significant for UTI and nephrolithiasis here for an acute visit for intromittent lower abd discomfort for a few weeks. Reports feels like she is constipation, occ has hard stools, and has intermittent crampy abd mild to mod pain in lower abd. Denies fevers, malaise, vomiting, diarrhea, weight loss, dysuria, vaginal discharge, bleeding or concerns for STI. Reports was on depo then IUD for pregnancy prevention - stopped in 06/2016 and no periods since. She is sexually active with her husband. Sees a gynecologist.  ROS: See pertinent positives and negatives per HPI.  Past Medical History:  Diagnosis Date  . Asthma   . Chickenpox   . Depression   . History of UTI   . Kidney stone   . MRSA (methicillin resistant staph aureus) culture positive     Past Surgical History:  Procedure Laterality Date  . FOOT SURGERY Left 2014    Family History  Problem Relation Age of Onset  . Arthritis Father   . Hyperlipidemia Father   . Hypertension Father   . Mental illness Father   . Diabetes Father   . Hyperlipidemia Maternal Grandmother   . Arthritis Maternal Grandmother   . Breast cancer Maternal Grandmother   . Ovarian cancer Maternal Grandmother   . Hypertension Maternal Grandmother   . Mental illness Maternal Grandmother   . Diabetes Maternal Grandmother   . Cancer Maternal Grandmother 50    Breast  . Arthritis Maternal Grandfather   . Mental illness Paternal Grandmother   . Hypertension Paternal Grandmother   . Arthritis Paternal Grandmother   . Arthritis Paternal Grandfather   . Mental illness Paternal Grandfather   . Hypertension Paternal Grandfather     Social History   Social History  . Marital status: Single    Spouse name: N/A  . Number of children: N/A  . Years of education: N/A   Social History Main Topics  . Smoking status: Former Games developermoker  . Smokeless tobacco: Never Used  . Alcohol use No  . Drug use: No  .  Sexual activity: Not Currently   Other Topics Concern  . None   Social History Narrative   Engaged   2 children   Works as an Airline pilotAccountant for an [employer]   Enjoys walking, running, spending time with her family    No current outpatient prescriptions on file.  EXAM:  Vitals:   12/08/16 1121  BP: 110/80  Pulse: 79  Temp: 98.2 F (36.8 C)    Body mass index is 37.88 kg/m.  GENERAL: vitals reviewed and listed above, alert, oriented, appears well hydrated and in no acute distress  HEENT: atraumatic, conjunttiva clear, no obvious abnormalities on inspection of external nose and ears  NECK: no obvious masses on inspection  LUNGS: clear to auscultation bilaterally, no wheezes, rales or rhonchi, good air movement  CV: HRRR, no peripheral edema  ABD: soft, NTTP, no rebound or guarding  MS: moves all extremities without noticeable abnormality  PSYCH: pleasant and cooperative, no obvious depression or anxiety  ASSESSMENT AND PLAN:  Discussed the following assessment and plan:  Abdominal discomfort - Plan: POC Urinalysis Dipstick  Constipation, unspecified constipation type  Amenorrhea - Plan: POCT urine pregnancy  -we discussed possible serious and likely etiologies, workup and treatment, treatment risks and return precautions -after this discussion, Katherine Ruiz opted for upreg, udip (both negative), bowel regimen, close follow up with PCP. If symptoms not resolved with bowel regimen may need  further evaluation. Also advised follow up with gyn about amenorrhea. -of course, we advised Katherine Ruiz  to return or notify a doctor immediately if symptoms worsen or persist or new concerns arise in the interim.   Patient Instructions  Fiber supplement such as metameucil or citracel in water every morning before breakfast.  Mirilax once daily in the morning for 5 days, then as needed if any hard stools or constipation.  Follow up with your doctor in 1 week.  Seek care  immediately if worsening or new concerns.   Katherine Basque R., DO

## 2016-12-14 ENCOUNTER — Ambulatory Visit (INDEPENDENT_AMBULATORY_CARE_PROVIDER_SITE_OTHER): Payer: BLUE CROSS/BLUE SHIELD | Admitting: Primary Care

## 2016-12-14 ENCOUNTER — Encounter: Payer: Self-pay | Admitting: Primary Care

## 2016-12-14 VITALS — BP 120/84 | HR 65 | Temp 98.7°F | Ht 62.0 in | Wt 208.8 lb

## 2016-12-14 DIAGNOSIS — R1032 Left lower quadrant pain: Secondary | ICD-10-CM

## 2016-12-14 DIAGNOSIS — R300 Dysuria: Secondary | ICD-10-CM

## 2016-12-14 LAB — CBC WITH DIFFERENTIAL/PLATELET
BASOS ABS: 0.1 10*3/uL (ref 0.0–0.1)
Basophils Relative: 0.8 % (ref 0.0–3.0)
EOS ABS: 0.9 10*3/uL — AB (ref 0.0–0.7)
Eosinophils Relative: 9 % — ABNORMAL HIGH (ref 0.0–5.0)
HEMATOCRIT: 38.3 % (ref 36.0–46.0)
HEMOGLOBIN: 13.3 g/dL (ref 12.0–15.0)
LYMPHS PCT: 31.1 % (ref 12.0–46.0)
Lymphs Abs: 3.1 10*3/uL (ref 0.7–4.0)
MCHC: 34.8 g/dL (ref 30.0–36.0)
MCV: 92.4 fl (ref 78.0–100.0)
MONO ABS: 0.9 10*3/uL (ref 0.1–1.0)
Monocytes Relative: 8.5 % (ref 3.0–12.0)
Neutro Abs: 5 10*3/uL (ref 1.4–7.7)
Neutrophils Relative %: 50.6 % (ref 43.0–77.0)
Platelets: 321 10*3/uL (ref 150.0–400.0)
RBC: 4.15 Mil/uL (ref 3.87–5.11)
RDW: 12.7 % (ref 11.5–15.5)
WBC: 10 10*3/uL (ref 4.0–10.5)

## 2016-12-14 LAB — COMPREHENSIVE METABOLIC PANEL
ALBUMIN: 4.6 g/dL (ref 3.5–5.2)
ALK PHOS: 58 U/L (ref 39–117)
ALT: 29 U/L (ref 0–35)
AST: 21 U/L (ref 0–37)
BILIRUBIN TOTAL: 0.2 mg/dL (ref 0.2–1.2)
BUN: 16 mg/dL (ref 6–23)
CALCIUM: 9.8 mg/dL (ref 8.4–10.5)
CHLORIDE: 104 meq/L (ref 96–112)
CO2: 27 mEq/L (ref 19–32)
CREATININE: 0.82 mg/dL (ref 0.40–1.20)
GFR: 88.42 mL/min (ref >60.00)
Glucose, Bld: 98 mg/dL (ref 70–99)
Potassium: 4.4 mEq/L (ref 3.5–5.1)
SODIUM: 137 meq/L (ref 135–145)
TOTAL PROTEIN: 7.1 g/dL (ref 6.0–8.3)

## 2016-12-14 LAB — IFOBT (OCCULT BLOOD): IFOBT: NEGATIVE

## 2016-12-14 LAB — POC URINALSYSI DIPSTICK (AUTOMATED)
Bilirubin, UA: NEGATIVE
Glucose, UA: NEGATIVE
KETONES UA: NEGATIVE
LEUKOCYTES UA: NEGATIVE
NITRITE UA: NEGATIVE
PH UA: 6
PROTEIN UA: NEGATIVE
RBC UA: NEGATIVE
Spec Grav, UA: 1.025
Urobilinogen, UA: NEGATIVE

## 2016-12-14 NOTE — Progress Notes (Signed)
Subjective:    Patient ID: Katherine Ruiz, female    DOB: 04-05-1991, 26 y.o.   MRN: [id number]  HPI  Katherine Ruiz is a 26 year old female with a history of UTI and pyelonephritis who presents today with a chief complaint of abdominal pain. She also reports abdominal bloating, nausea, dysuria, urinary frequency, and hemorrhoids. Her pain is located to the abdomen (mostly lower) with radiation to left flank. She denies fevers, vomiting, changes in appetite, vaginal symptoms, hematuria.   She was recently evaluated at another [place] office on 12/08/16 for same complaints. She under went UA and urine pregnancy which were both unremarkable. She was diagnosed and treated for constipation. She was advised to start Miralax and a fiber supplement and follow up with PCP if no improvement. She then presented to Urgent Care on 12/11/16. She underwent KUB which showed constipation. They treated her symptoms with dicyclomine and naproxen tablets and told her to go on a liquid diet.   Since her Urgent Care visit she's noticed dysuria and urinary frequency. She continues to experience her abdominal pain. She's been passing normal stools. She has noticed light, bright, red bleeding after bowel movements on toilet paper. She's been treating with Preparation H with some improvement. Her symptoms are worse with eating. She has a history of esophageal burning which is about the same. She is not taking anything OTC for her symptoms. Overall she's not feeling herself.  Review of Systems  Constitutional: Positive for fatigue. Negative for chills and fever.  Respiratory: Negative for shortness of breath.   Cardiovascular: Negative for chest pain.  Gastrointestinal: Positive for abdominal pain and nausea. Negative for blood in stool, constipation and vomiting.  Genitourinary: Positive for dysuria. Negative for frequency, hematuria, pelvic pain and vaginal discharge.  Neurological: Negative for weakness.       Past  Medical History:  Diagnosis Date  . Asthma   . Chickenpox   . Depression   . History of UTI   . Kidney stone   . MRSA (methicillin resistant staph aureus) culture positive      Social History   Social History  . Marital status: Single    Spouse name: N/A  . Number of children: N/A  . Years of education: N/A   Occupational History  . Not on file.   Social History Main Topics  . Smoking status: Former Games developer  . Smokeless tobacco: Never Used  . Alcohol use No  . Drug use: No  . Sexual activity: Not Currently   Other Topics Concern  . 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 FOOT SURGERY Left 2014    Family History  Problem Relation Age of Onset  . Arthritis Father   . Hyperlipidemia Father   . Hypertension Father   . Mental illness Father   . Diabetes Father   . Hyperlipidemia Maternal Grandmother   . Arthritis Maternal Grandmother   . Breast cancer Maternal Grandmother   . Ovarian cancer Maternal Grandmother   . Hypertension Maternal Grandmother   . Mental illness Maternal Grandmother   . Diabetes Maternal Grandmother   . Cancer Maternal Grandmother 50    Breast  . Arthritis Maternal Grandfather   . Mental illness Paternal Grandmother   . Hypertension Paternal Grandmother   .  Arthritis Paternal Grandmother   . Arthritis Paternal Grandfather   . Mental illness Paternal Grandfather   . Hypertension Paternal Grandfather     Allergies  Allergen Reactions  . Lamotrigine Hives  . Vancomycin Swelling  . Septra [Sulfamethoxazole-Trimethoprim] Rash    No current outpatient prescriptions on file prior to visit.   No current facility-administered medications on file prior to visit.     BP 120/84   Pulse 65   Temp 98.7 F (37.1 C) (Oral)   Ht 5\' 2"  (1.575 m)   Wt  208 lb 12.8 oz (94.7 kg)   SpO2 99%   BMI 38.19 kg/m    Objective:   Physical Exam  Constitutional: She appears well-nourished. She does not appear ill.  Neck: Neck supple.  Cardiovascular: Normal rate and regular rhythm.   Pulmonary/Chest: Effort normal and breath sounds normal.  Abdominal: Soft. Bowel sounds are normal. There is tenderness in the left upper quadrant and left lower quadrant. There is no CVA tenderness.  Skin: Skin is warm and dry.          Assessment & Plan:  Abdominal Pain:  Ongoing for weeks now. No improvement with bowel movements and medications. Exam today with left upper and lower quadrant pain. Given no improvements with treatment and continued symptoms she will need labs and imaging. H pylori, CBC, CMP pending. CT abdomen/pelvis ordered and pending. UA today negative.  She is stable for outpatient treatment, does not appear sickly. Discussed to try Pepcid for now. Will await results.  Katherine Ruiz,Katherine Grieshop Kendal, NP

## 2016-12-14 NOTE — Progress Notes (Signed)
Pre visit review using our clinic review tool, if applicable. No additional management support is needed unless otherwise documented below in the visit note. 

## 2016-12-14 NOTE — Patient Instructions (Signed)
Complete lab work prior to leaving today.   Stop by the front desk and speak with either Shirlee LimerickMarion to schedule your CT scan.   We will be in touch with you once we receive your results.  It was a pleasure to see you today!

## 2016-12-14 NOTE — Addendum Note (Signed)
Addended by: Tawnya CrookSAMBATH, Carita Sollars on: 12/14/2016 02:18 PM   Modules accepted: Orders

## 2016-12-15 ENCOUNTER — Encounter: Payer: Self-pay | Admitting: Primary Care

## 2016-12-15 LAB — H. PYLORI ANTIBODY, IGG: H PYLORI IGG: NEGATIVE

## 2016-12-16 ENCOUNTER — Telehealth: Payer: Self-pay

## 2016-12-16 ENCOUNTER — Telehealth: Payer: Self-pay | Admitting: Primary Care

## 2016-12-16 ENCOUNTER — Encounter: Payer: Self-pay | Admitting: Primary Care

## 2016-12-16 NOTE — Telephone Encounter (Signed)
Do not have CT results as of yet, will call when received.

## 2016-12-16 NOTE — Telephone Encounter (Signed)
Patient called to get the results of her CT.  Patient said she's in pain on left side into her back.  Please call patient.

## 2016-12-16 NOTE — Telephone Encounter (Signed)
PLEASE NOTE: All timestamps contained within this report are represented as [place] Standard Time. CONFIDENTIALTY NOTICE: This fax transmission is intended only for the addressee. It contains information that is legally privileged, confidential or otherwise protected from use or disclosure. If you are not the intended recipient, you are strictly prohibited from reviewing, disclosing, copying using or disseminating any of this information or taking any action in reliance on or regarding this information. If you have received this fax in error, please notify us immediately by telephone so that we can arrange for its return to us. Phone: [phone], Toll-Free: [phone], Fax: [phone] Page: 1 of 1 Call Id: [id number] [facility] - Client Nonclinical Telephone Record [facility] Client [facility] - [facility] - Night Physician Vernona Riegerlark, Katherine - NP Contact Type Call Who Is Calling Patient / Member / Family / Caregiver Caller Name Katherine Ruiz Caller Phone Number [phone] Patient Name Call Type Message Only Information Provided Reason for Call Request for Lab/Test Results Initial Comment Caller is trying to get her lab results. Additional Comment Call Closed By: Knox RoyaltyBreAnna Booth Transaction Date/Time: 12/15/2016 5:58:22 PM (ET)

## 2016-12-18 ENCOUNTER — Other Ambulatory Visit: Payer: Self-pay | Admitting: Primary Care

## 2016-12-18 DIAGNOSIS — R1032 Left lower quadrant pain: Secondary | ICD-10-CM

## 2016-12-19 NOTE — Telephone Encounter (Signed)
Katherine Ruiz has notified patient of results on 12/16/2016

## 2016-12-22 ENCOUNTER — Encounter: Payer: Self-pay | Admitting: Internal Medicine

## 2016-12-22 ENCOUNTER — Ambulatory Visit (INDEPENDENT_AMBULATORY_CARE_PROVIDER_SITE_OTHER): Payer: BLUE CROSS/BLUE SHIELD | Admitting: Internal Medicine

## 2016-12-22 VITALS — BP 102/60 | HR 64 | Ht 62.0 in | Wt 208.8 lb

## 2016-12-22 DIAGNOSIS — N912 Amenorrhea, unspecified: Secondary | ICD-10-CM

## 2016-12-22 DIAGNOSIS — K644 Residual hemorrhoidal skin tags: Secondary | ICD-10-CM | POA: Diagnosis not present

## 2016-12-22 DIAGNOSIS — R10814 Left lower quadrant abdominal tenderness: Secondary | ICD-10-CM | POA: Diagnosis not present

## 2016-12-22 DIAGNOSIS — R1032 Left lower quadrant pain: Secondary | ICD-10-CM | POA: Diagnosis not present

## 2016-12-22 MED ORDER — TRAMADOL HCL 50 MG PO TABS: 50.0000 mg | ORAL_TABLET | Freq: Four times a day (QID) | ORAL | 0 refills | Status: DC | PRN

## 2016-12-22 MED ORDER — HYDROCORTISONE 2.5 % RE CREA: 1.0000 "application " | TOPICAL_CREAM | Freq: Two times a day (BID) | RECTAL | 0 refills | Status: DC | PRN

## 2016-12-22 NOTE — Patient Instructions (Addendum)
   [facility] on [address] will contact you about sitting up your imaging studies, orders were faxed to [phone].   We have sent the following medications to your pharmacy for you to pick up at your convenience: Tramadol was faxed in to your pharmacy, hydrocortisone cream    I appreciate the opportunity to care for you. Stan Headarl Gessner, MD, [facility]

## 2016-12-22 NOTE — Progress Notes (Signed)
Katherine Ruiz 25 y.o. [birth year] [id number] Referred by: Doreene Nest, NP  Assessment & Plan:   Encounter Diagnoses  Name Primary?  Marland Kitchen LLQ pain Yes  . Left lower quadrant abdominal tenderness without rebound tenderness   . Amenorrhea due to Depo Provera   . External hemorrhoids with complication    I'm not sure exactly what's going on here. She has some features of abdominal wall pain. I think conical large it i.e. left ovarian pathology could pill a role here 2.   Treat hemorrhoids with proctocream  tramadol when necessary pain for now.  I don't think there is any good indication for endoscopic evaluation. A gynecologic evaluation i.e. ultrasound at least is negative then perhaps a GYN exam is warranted. She wishes to see a different gynecologist and she saw with the IUD.  May consider trial of nonsteroidal given some clinical features of abdominal wall pain. He does not recall any type of strain etc. though she does lift things on a regular basis.  I appreciate the opportunity to care for this patient. CC: Morrie Sheldon, NP     Subjective:   Chief Complaint: Left-sided abdominal pain x 2 weeks  HPI The patient is a very nice 26 year old married white woman, with a two-week history of left lower quadrant pain. She says she has a pain there radiates around to her back it's relatively constant it doesn't seem to be affected by movement or bowel habits etc. She had a CT scan of the abdomen and pelvis there are no vomiting that was negative. 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 This seems different. She has been on Depo Provera, with a transient attempted an IUD but it was very painful so was removed, so she has not had menses in many months. She initially thought maybe she was going to have a menstrual period when this started. She was treated with dicyclomine, it did not help her pain  and occasional bad headaches. He was seen in urgent care and then went to see her primary care provider. I have reviewed those notes. Has a history of kidney stones but says this is not like that she's not had any dysuria in her recent urinalysis was negative. GI review of systems is positive for some heartburn and gas symptoms and bloating at times. He does have a history of GERD and stomach upset and dietary modifications control that. Regarding her anal and rectal symptoms she has tried Preparation H that seemed to make things burn and itch.  Other studies she's had including a negative H. pylori antibody. We will CBC and seem at other than mild eosinophilia. Negative immune fecal occult blood test. Urine pregnancy negative.  Current Meds  Medication Sig  . albuterol (PROVENTIL HFA;VENTOLIN HFA) 108 (90 Base) MCG/ACT inhaler Inhale into the lungs as needed.  . dicyclomine (BENTYL) 20 MG tablet Take 20 mg by mouth 3 (three) times daily before meals.   . naproxen (NAPROSYN) 500 MG tablet Take 500 mg by mouth 2 (two) times daily with a meal.   . omeprazole (PRILOSEC OTC) 20 MG tablet Take 20 mg by mouth daily.   Allergies  Allergen Reactions  . Lamotrigine Hives  . Vancomycin Swelling  . Septra [Sulfamethoxazole-Trimethoprim] Rash   Past Medical History:  Diagnosis Date  . Asthma   . Chickenpox   . Depression   . History of UTI   .  Kidney stone   . MRSA (methicillin resistant staph aureus) culture positive    Past Surgical History:  Procedure Laterality Date  . FOOT SURGERY Left 2014   Social History   Social History  . Marital status: Married    Spouse name: N/A  . Number of children: N/A  . Years of education: N/A   Social History Main Topics  . Smoking status: Former Games developermoker  . Smokeless tobacco: Never Used  . Alcohol use No  . Drug use: No  . Sexual activity: Not Currently   Other Topics Concern  . None   Social History Narrative   Married   2 children born 2010  and 2011, both girls   Works as an Airline pilotAccountant for an [employer]   Enjoys walking, running, spending time with her family   12/22/2016      family history includes Arthritis in her father, maternal grandfather, maternal grandmother, paternal grandfather, and paternal grandmother; Breast cancer in her maternal grandmother; Cancer (age of onset: 5250) in her maternal grandmother; Diabetes in her father and maternal grandmother; Hyperlipidemia in her father and maternal grandmother; Hypertension in her father, maternal grandmother, paternal grandfather, and paternal grandmother; Mental illness in her father, maternal grandmother, paternal grandfather, and paternal grandmother; Ovarian cancer in her maternal grandmother.    Review of Systems As above. She has history of skin rash and fatigue. All other systems are negative at this time.  Objective:   Physical Exam @BP  102/60   Pulse 64   Ht 5\' 2"  (1.575 m)   Wt 208 lb 12.8 oz (94.7 kg)   LMP 06/22/2015 (Approximate)   BMI 38.19 kg/m @  General:  Well-developed, well-nourished and in no acute distress Eyes:  anicteric. ENT:   Mouth and posterior pharynx free of lesions.  Neck:   supple w/o thyromegaly or mass.  Lungs: Clear to auscultation bilaterally. Heart:  S1S2, no rubs, murmurs, gallops. Abdomen:  soft, tender LLQ - worse w/ mm tendsion but neg Carnett's, no hepatosplenomegaly, hernia, or mass and BS+.  Rectal:  Patti [place]az[place], CMA present.  Anoderm inspection revealed no abnormalities Digital exam revealed mildly increased resting tone and  No mass or rectocele present.   Anoscopy: inflamed Gr 1 external hemorrhoids all positions  Lymph:  no cervical or supraclavicular adenopathy. Extremities:   no edema, cyanosis or clubbing Skin   no rash. + tattoos upper back Neuro:  A&O x 3.  Psych:  appropriate mood and  Affect.   Data Reviewed: As per history of present illness. I've also reviewed recent labs.

## 2016-12-27 ENCOUNTER — Encounter: Payer: Self-pay | Admitting: Internal Medicine

## 2016-12-28 ENCOUNTER — Encounter: Payer: Self-pay | Admitting: Primary Care

## 2016-12-28 ENCOUNTER — Telehealth: Payer: Self-pay

## 2016-12-28 NOTE — Telephone Encounter (Signed)
Patient reports that she is still having LLQ pain.  Cramping is worsening.  She is questioning if this is related to her menstrual cycle.  She is going to look into a GYN

## 2016-12-28 NOTE — Telephone Encounter (Signed)
Katherine Ruiz,  Is there a way to release the CT scan and both ultrasounds to My Chart. If not can we fax the results to her GYN? See her My Chart message.

## 2016-12-28 NOTE — Telephone Encounter (Signed)
OK - I will message her PCP also and perhaps she can help  I think seeing GYN makes sense

## 2016-12-28 NOTE — Telephone Encounter (Signed)
-----   Message from Iva Booparl E Gessner, MD sent at 12/28/2016  1:30 PM EST ----- Regarding: US results Please call this patient  She had Koreas at [facility]  Pelvic shows complex right ovarian cyst and normal left ovary - otherwise NL Abd US showed small hepatic hemangioma - not an issue, all else ok  Please ask how she is re: LLQ pain and any new sxs?

## 2017-01-02 ENCOUNTER — Encounter: Payer: Self-pay | Admitting: Internal Medicine

## 2017-02-17 ENCOUNTER — Telehealth: Payer: Self-pay

## 2017-02-17 NOTE — Telephone Encounter (Signed)
This TH note did not come to desk top. I spoke with pt and she has already been seen by OB-GYN. Nothing further needed. FYI to Mayra Reel NP.

## 2017-02-17 NOTE — Telephone Encounter (Signed)
PLEASE NOTE: All timestamps contained within this report are represented as [place] Standard Time. CONFIDENTIALTY NOTICE: This fax transmission is intended only for the addressee. It contains information that is legally privileged, confidential or otherwise protected from use or disclosure. If you are not the intended recipient, you are strictly prohibited from reviewing, disclosing, copying using or disseminating any of this information or taking any action in reliance on or regarding this information. If you have received this fax in error, please notify us immediately by telephone so that we can arrange for its return to [place]. Phone: [phone], Toll-Free: [phone], Fax: [phone] Page: 1 of 2 Call Id: [id number] [facility] Day - Client TELEPHONE ADVICE RECORD [facility] Patient Name: Katherine Ruiz Gender: Female DOB: 05-12-1991 Age: 26 Y 10 M 17 D Return Phone Number: [phone] (Primary) City/State/Zip: [place] Client [facility] [place] Day - Client Client Site [facility] [place] - Day Physician Vernona Rieger - NP Who Is Calling Patient / Member / Family / Caregiver Call Type Triage / Clinical Relationship To Patient Self Return Phone Number [phone] (Primary) Chief Complaint Abdominal Pain Reason for Call Symptomatic / Request for Health Information Initial Comment Caller just recently had mrsa 3 weeks ago that was cut open. She now has one on her bikini line and it busted open and it is bleeding a little bit. Appointment Disposition EMR Appointment Attempted - Not Scheduled Info pasted into Epic No Nurse Assessment Nurse: Mechele Collin, RN, Sue Lush Date/Time Lamount Cohen Time): 02/17/2017 3:24:47 PM Confirm and document reason for call. If symptomatic, describe symptoms. ---Caller stated just recently had mrsa 3 weeks ago that was cut open. She now has one on her bikini line and it busted open and it is  bleeding a little bit. Caller also states that she can see infection "yellow pus" and blood. Possible mild fever. Does the PT have any chronic conditions? (i.e. diabetes, asthma, etc.) ---Yes List chronic conditions. ---MRSA Infections Is the patient pregnant or possibly pregnant? (Ask all females between the ages of 20-55) ---No Guidelines Guideline Title Affirmed Question Sores [1] Red area or streak AND [2] fever Disp. Time Lamount Cohen Time) Disposition Final User 02/17/2017 3:34:17 PM See Physician within 4 Hours (or PCP triage) Yes Mechele Collin, RN, Sue Lush Referrals GO TO FACILITY UNDECIDED Care Advice Given Per Guideline SEE PHYSICIAN WITHIN 4 HOURS (or PCP triage): FEVER MEDICINES: ACETAMINOPHEN (E.G., TYLENOL): * Take 650 mg (two 325 mg pills) by mouth every 4-6 hours as needed. Each Regular Strength Tylenol pill has 325 mg of acetaminophen. The most you should take each day is 3,250 mg (10 Regular Strength pills a day). IBUPROFEN (E.G., MOTRIN, ADVIL): * Take 400 mg (two 200 mg pills) by mouth every 6 hours as needed. CALL BACK IF: * You become worse. CARE ADVICE given per Sores (Adult) guideline. PLEASE NOTE: All timestamps contained within this report are represented as [place] Standard Time. CONFIDENTIALTY NOTICE: This fax transmission is intended only for the addressee. It contains information that is legally privileged, confidential or otherwise protected from use or disclosure. If you are not the intended recipient, you are strictly prohibited from reviewing, disclosing, copying using or disseminating any of this information or taking any action in reliance on or regarding this information. If you have received this fax in error, please notify us immediately by telephone so that we can arrange for its return to [place]. Phone: [phone], Toll-Free: [phone], Fax: [phone] Page: 2 of 2 Call Id: [id number]

## 2017-02-17 NOTE — Telephone Encounter (Signed)
Noted  

## 2017-06-29 ENCOUNTER — Ambulatory Visit (INDEPENDENT_AMBULATORY_CARE_PROVIDER_SITE_OTHER): Payer: BLUE CROSS/BLUE SHIELD | Admitting: Primary Care

## 2017-06-29 ENCOUNTER — Encounter: Payer: Self-pay | Admitting: Primary Care

## 2017-06-29 VITALS — BP 122/78 | HR 63 | Temp 98.3°F | Ht 62.0 in | Wt 200.8 lb

## 2017-06-29 DIAGNOSIS — R454 Irritability and anger: Secondary | ICD-10-CM

## 2017-06-29 NOTE — Patient Instructions (Signed)
Call your GYN office and notify them of your symptoms. Tell them that I recommend they consider changing your birth control as this is likely the cause of your symptoms.  Please call me if you have any problems.  It was a pleasure to see you today!

## 2017-06-29 NOTE — Progress Notes (Signed)
Subjective:    Patient ID: Katherine Ruiz, female    DOB: 1990/11/19, 26 y.o.   MRN: [id number]  HPI  Katherine Ruiz is a 26 year old female who presents today with a chief complaint of irritability and sadness. She denies increased stress or problems at home or work. Her life is great and she has no complaints. She started a new birth control 5 months ago, previously managed on Depo Provera.   Her symptoms have been present for the past three weeks. Symptoms include increased irritability, snapping at her family, flushed feeling, anxious feeling, sadness, feeling down.   She was once seeing a psychiatrist several years ago. 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 She's been off of her Zoloft since 2015 as she no longer had insurance. She's not had problems with anxiety or depression since coming off of Zoloft in 2015.  Review of Systems  Constitutional: Negative for fatigue.  Respiratory: Negative for shortness of breath.   Cardiovascular: Negative for chest pain.  Psychiatric/Behavioral: Negative for suicidal ideas. The patient is nervous/anxious.        Irritability, sadness       Past Medical History:  Diagnosis Date  . Asthma   . Chickenpox   . Depression   . History of UTI   . Kidney stone   . MRSA (methicillin resistant staph aureus) culture positive      Social History   Social History  . Marital status: Married    Spouse name: N/A  . Number of children: N/A  . Years of education: N/A   Occupational History  . Not on file.   Social History Main Topics  . Smoking status: Former Games developermoker  . Smokeless tobacco: Never Used  . Alcohol use No  . Drug use: No  . Sexual activity: Not Currently   Other Topics Concern  . Not on file   Social History Narrative   Married   2 children born 2010 and 2011, both girls   Works as an Airline pilotAccountant for an [employer]   Enjoys walking,  running, spending time with her family   12/22/2016       Past Surgical History:  Procedure Laterality Date  . FOOT SURGERY Left 2014    Family History  Problem Relation Age of Onset  . Arthritis Father   . Hyperlipidemia Father   . Hypertension Father   . Mental illness Father   . Diabetes Father   . Hyperlipidemia Maternal Grandmother   . Arthritis Maternal Grandmother   . Breast cancer Maternal Grandmother   . Ovarian cancer Maternal Grandmother   . Hypertension Maternal Grandmother   . Mental illness Maternal Grandmother   . Diabetes Maternal Grandmother   . Cancer Maternal Grandmother 50       Breast  . Arthritis Maternal Grandfather   . Mental illness Paternal Grandmother   . Hypertension Paternal Grandmother   . Arthritis Paternal Grandmother   . Arthritis Paternal Grandfather   . Mental illness Paternal Grandfather   . Hypertension Paternal Grandfather     Allergies  Allergen Reactions  . Lamotrigine Hives  . Vancomycin Swelling  . Septra [Sulfamethoxazole-Trimethoprim] Rash    Current Outpatient Prescriptions on File Prior to Visit  Medication Sig Dispense Refill  . omeprazole (PRILOSEC OTC) 20 MG tablet Take 20 mg by mouth daily.    Marland Kitchen. albuterol (PROVENTIL HFA;VENTOLIN HFA) 108 (90 Base)  MCG/ACT inhaler Inhale 1-2 puffs into the lungs as needed.      No current facility-administered medications on file prior to visit.     BP 122/78   Pulse 63   Temp 98.3 F (36.8 C) (Oral)   Ht 5\' 2"  (1.575 m)   Wt 200 lb 12.8 oz (91.1 kg)   SpO2 98%   BMI 36.73 kg/m    Objective:   Physical Exam  Constitutional: She appears well-nourished.  Neck: Neck supple.  Cardiovascular: Normal rate and regular rhythm.   Pulmonary/Chest: Effort normal and breath sounds normal.  Skin: Skin is warm and dry.  Psychiatric: She has a normal mood and affect.          Assessment & Plan:  Irritability:  Also with some sadness.  Based off of HPI and duration of  symptoms, she is likely experiencing these symptoms from her birth control dose. I recommend she call her GYN office to see if they can adjust the dose of her hormones.  I don't recommend we initiate an SSRI given the fact that she's not experiencing any increased stress at home/work, has done very well without Zoloft since 215, and there are no other reasons for symptoms except for a recent change in hormones.  She will call her GYN office to discuss. Would recommend a decrease in progesterone.  Denies SI/HI.   Morrie Sheldon, NP

## 2017-07-12 ENCOUNTER — Telehealth: Payer: Self-pay

## 2017-07-12 NOTE — Telephone Encounter (Signed)
Pt left v/m; pt seen 06/29/17; pt contacted her GYN and GYN does not think the [facility] is the reason for depression and anxiety. Pt is getting worse and wants to know what to do now. Pt request cb. [facility].

## 2017-07-12 NOTE — Telephone Encounter (Signed)
Please notify patient that we could try restarting her Zoloft, let me know if she's agreeable. If so then have her start by taking 1/2 tablet daily for 8 days, then increase to 1 full tablet thereafter. Also needs 6 week office visit follow up if she decides to move forward with Zoloft.

## 2017-07-12 NOTE — Telephone Encounter (Signed)
Spoken and notified patient of Kate's comments. Patient verbalized understanding. Patient stated that she wants to think about it. She will call back to let me know or send a message through MyChart.

## 2018-02-26 ENCOUNTER — Encounter: Payer: Self-pay | Admitting: Family Medicine

## 2018-02-26 ENCOUNTER — Ambulatory Visit: Payer: BLUE CROSS/BLUE SHIELD | Admitting: Family Medicine

## 2018-02-26 VITALS — BP 118/66 | HR 74 | Temp 98.3°F | Ht 62.0 in | Wt 200.0 lb

## 2018-02-26 DIAGNOSIS — R300 Dysuria: Secondary | ICD-10-CM | POA: Diagnosis not present

## 2018-02-26 DIAGNOSIS — N898 Other specified noninflammatory disorders of vagina: Secondary | ICD-10-CM | POA: Diagnosis not present

## 2018-02-26 LAB — POC URINALSYSI DIPSTICK (AUTOMATED)
Bilirubin, UA: NEGATIVE
Blood, UA: NEGATIVE
Glucose, UA: NEGATIVE
Ketones, UA: NEGATIVE
LEUKOCYTES UA: NEGATIVE
NITRITE UA: NEGATIVE
PH UA: 6 (ref 5.0–8.0)
PROTEIN UA: NEGATIVE
Spec Grav, UA: 1.02 (ref 1.010–1.025)
Urobilinogen, UA: 0.2 E.U./dL

## 2018-02-26 MED ORDER — FLUCONAZOLE 150 MG PO TABS: 150.0000 mg | ORAL_TABLET | Freq: Once | ORAL | 0 refills | Status: AC

## 2018-02-26 NOTE — Patient Instructions (Addendum)
Urinalysis returned ok. Wet prep sent for infection.  Ok to continue azo for 5 days then stop. Avoid bladder irritants like caffeine, spicy foods, dark sodas.  Will treat as possible yeast infection with diflucan 150mg  x1. May repeat in 4 days if persistent symptoms. Let us know if ongoing symptoms despite above.

## 2018-02-26 NOTE — Progress Notes (Signed)
BP 118/66 (BP Location: Left Arm, Patient Position: Sitting, Cuff Size: Normal)   Pulse 74   Temp 98.3 F (36.8 C) (Oral)   Ht 5\' 2"  (1.575 m)   Wt 200 lb (90.7 kg)   LMP 02/24/2018   SpO2 98%   BMI 36.58 kg/m    CC: dysuria Subjective:    Patient ID: Katherine Ruiz, female    DOB: 10/21/1991, 27 y.o.   MRN: [id number]  HPI: STACY SAILER is a 27 y.o. female presenting on 02/26/2018 for Dysuria (Burning, itching and pain with urination. Started about 2 wks ago.  Has tried Monistat and probiotic thinking may be yeast infection. ); Abdominal Pain (Pain in lower abd on both sides.); and Back Pain (C/o low back pain on both sides.)   2 wk h/o yeast infection symptoms - mild vaginal discharge - treated with monistat with some short term improvement. Since then persistent and worsening itching, as well as urine symptoms of dysuria, urgency, lower abd pain, lower back pain, leg cramping. No fevers/chills, nausea/vomiting.   Treating with azo, monistat, taking tylenol.  No recent abx use.  No further vaginal discharge sxs.   LMP last Saturday - on OCP Blisovi. Heavier than normal this month.   Last UTI - maybe 11/2016.   Relevant past medical, surgical, family and social history reviewed and updated as indicated. Interim medical history since our last visit reviewed. Allergies and medications reviewed and updated. Outpatient Medications Prior to Visit  Medication Sig Dispense Refill  . Probiotic Product (PROBIOTIC DAILY PO) Take by mouth daily.    Marland Kitchen albuterol (PROVENTIL HFA;VENTOLIN HFA) 108 (90 Base) MCG/ACT inhaler Inhale 1-2 puffs into the lungs as needed.     Marland Kitchen BLISOVI 24 FE 1-20 MG-MCG(24) tablet Take 1 tablet by mouth daily.  2  . omeprazole (PRILOSEC OTC) 20 MG tablet Take 20 mg by mouth daily as needed.      No facility-administered medications prior to visit.      Per HPI unless specifically indicated in ROS section below Review of Systems     Objective:    BP  118/66 (BP Location: Left Arm, Patient Position: Sitting, Cuff Size: Normal)   Pulse 74   Temp 98.3 F (36.8 C) (Oral)   Ht 5\' 2"  (1.575 m)   Wt 200 lb (90.7 kg)   LMP 02/24/2018   SpO2 98%   BMI 36.58 kg/m   Wt Readings from Last 3 Encounters:  02/26/18 200 lb (90.7 kg)  06/29/17 200 lb 12.8 oz (91.1 kg)  12/22/16 208 lb 12.8 oz (94.7 kg)    Physical Exam  Constitutional: She appears well-developed and well-nourished.  HENT:  Mouth/Throat: Oropharynx is clear and moist. No oropharyngeal exudate.  Eyes: Pupils are equal, round, and reactive to light.  Cardiovascular: Normal rate, regular rhythm and normal heart sounds.  No murmur heard. Pulmonary/Chest: Effort normal and breath sounds normal. No respiratory distress. She has no wheezes. She has no rhonchi. She has no rales.  Abdominal: Soft. Normal appearance and bowel sounds are normal. She exhibits no distension, no ascites and no mass. There is no hepatosplenomegaly. There is tenderness (mild) in the epigastric area and suprapubic area. There is no rigidity, no rebound, no guarding, no CVA tenderness and negative Murphy's sign. No hernia.  Genitourinary: Vagina normal. No erythema or bleeding in the vagina. No vaginal discharge found.  Genitourinary Comments: Scant thick white discharge present Wet prep sent off  Neurological: She is alert.  Skin:  Skin is warm.  Nursing note and vitals reviewed.  Results for orders placed or performed in visit on 02/26/18  POCT Urinalysis Dipstick (Automated)  Result Value Ref Range   Color, UA yellow    Clarity, UA clear    Glucose, UA negative    Bilirubin, UA negative    Ketones, UA negative    Spec Grav, UA 1.020 1.010 - 1.025   Blood, UA negative    pH, UA 6.0 5.0 - 8.0   Protein, UA negative    Urobilinogen, UA 0.2 0.2 or 1.0 E.U./dL   Nitrite, UA negative    Leukocytes, UA Negative Negative  Micro: WBC rare RBC none Bact tr Casts none Epi rare UCx not sent    Assessment  & Plan:   Problem List Items Addressed This Visit    Dysuria    UA and micro today normal.       Relevant Orders   POCT Urinalysis Dipstick (Automated) (Completed)   Vaginal discharge - Primary    Wet prep sent. Will Rx diflucan for possible residual yeast infection. Update if not improving with treatment.       Relevant Orders   WET PREP BY MOLECULAR PROBE       Meds ordered this encounter  Medications  . fluconazole (DIFLUCAN) 150 MG tablet    Sig: Take 1 tablet (150 mg total) by mouth once for 1 dose. Repeat in 4 days if needed    Dispense:  2 tablet    Refill:  0   Orders Placed This Encounter  Procedures  . WET PREP BY MOLECULAR PROBE  . POCT Urinalysis Dipstick (Automated)    Follow up plan: Return if symptoms worsen or fail to improve.  Eustaquio BoydenJavier Olivia Pavelko, MD

## 2018-02-26 NOTE — Assessment & Plan Note (Signed)
Wet prep sent. Will Rx diflucan for possible residual yeast infection. Update if not improving with treatment.

## 2018-02-26 NOTE — Assessment & Plan Note (Signed)
UA and micro today normal.

## 2018-02-27 LAB — WET PREP BY MOLECULAR PROBE
CANDIDA SPECIES: NOT DETECTED
Gardnerella vaginalis: NOT DETECTED
MICRO NUMBER:: 90460634
SPECIMEN QUALITY:: ADEQUATE
Trichomonas vaginosis: NOT DETECTED

## 2018-08-15 ENCOUNTER — Encounter: Payer: Self-pay | Admitting: Primary Care

## 2018-08-15 ENCOUNTER — Ambulatory Visit (INDEPENDENT_AMBULATORY_CARE_PROVIDER_SITE_OTHER)
Admission: RE | Admit: 2018-08-15 | Discharge: 2018-08-15 | Disposition: A | Discharge status: Home / Self Care | Payer: BLUE CROSS/BLUE SHIELD | Source: Ambulatory Visit | Attending: Primary Care | Admitting: Primary Care

## 2018-08-15 ENCOUNTER — Ambulatory Visit: Payer: BLUE CROSS/BLUE SHIELD | Admitting: Primary Care

## 2018-08-15 VITALS — BP 118/68 | HR 66 | Temp 98.0°F | Ht 62.0 in | Wt 177.5 lb

## 2018-08-15 DIAGNOSIS — J4520 Mild intermittent asthma, uncomplicated: Secondary | ICD-10-CM

## 2018-08-15 DIAGNOSIS — J45909 Unspecified asthma, uncomplicated: Secondary | ICD-10-CM | POA: Insufficient documentation

## 2018-08-15 DIAGNOSIS — M5431 Sciatica, right side: Secondary | ICD-10-CM | POA: Insufficient documentation

## 2018-08-15 MED ORDER — PREDNISONE 20 MG PO TABS: ORAL_TABLET | ORAL | 0 refills | Status: DC

## 2018-08-15 MED ORDER — ALBUTEROL SULFATE HFA 108 (90 BASE) MCG/ACT IN AERS: 1.0000 | INHALATION_SPRAY | Freq: Four times a day (QID) | RESPIRATORY_TRACT | 0 refills | Status: DC | PRN

## 2018-08-15 NOTE — Assessment & Plan Note (Signed)
Chronic, intermittent. Exam and HPI consistent for sciatic involvement. Low suspicion for disc involvement but given chronicity will check plain films of lumbar spine.   Rx for prednisone course and stretching exercises provide. She will update.

## 2018-08-15 NOTE — Progress Notes (Signed)
Subjective:    Patient ID: Katherine Ruiz, female    DOB: 1991/01/14, 27 y.o.   MRN: [id number]  HPI  Katherine Ruiz is a 27 year old female who presents today with a chief complaint of numbness. She is also needing a medication refill.   She endorses a history of sciatica that is intermittent that will start to the right lower back down to her calves, this has been intermittent for years.   She noticed numbness/tingling to the right great toe that began six days ago. She's then noticed numbness/tingling to the second toe and mid dorsal and plantar foot. One day ago she noticed her sciatica symptoms to her right buttocks down to her right calf. Some numbness to the right posterior calf. Sitting makes her symptoms of sciatica worse, improved when walking and movement.   She has noticed weakness to her right great toe. She denies loss of bowel or bladder control, issues with her left back or lower extremity.    She is needing a refill of her albuterol inhaler for which she uses during hot summer days, seasonal changes, and sometimes during exercise.   Review of Systems  Musculoskeletal: Positive for back pain.  Skin: Negative for color change.  Neurological: Positive for weakness and numbness.       Past Medical History:  Diagnosis Date  . Asthma   . Chickenpox   . Depression   . History of UTI   . Kidney stone   . MRSA (methicillin resistant staph aureus) culture positive      Social History   Socioeconomic History  . Marital status: Married    Spouse name: Not on file  . Number of children: Not on file  . Years of education: Not on file  . Highest education level: Not on file  Occupational History  . Not on file  Social Needs  . Financial resource strain: Not on file  . Food insecurity:    Worry: Not on file    Inability: Not on file  . Transportation needs:    Medical: Not on file    Non-medical: Not on file  Tobacco Use  . Smoking status: Former Games developer  .  Smokeless tobacco: Never Used  Substance and Sexual Activity  . Alcohol use: No    Alcohol/week: 0.0 standard drinks  . Drug use: No  . Sexual activity: Not Currently  Lifestyle  . Physical activity:    Days per week: Not on file    Minutes per session: Not on file  . Stress: Not on file  Relationships  . Social connections:    Talks on phone: Not on file    Gets together: Not on file    Attends religious service: Not on file    Active member of club or organization: Not on file    Attends meetings of clubs or organizations: Not on file    Relationship status: Not on file  . Intimate partner violence:    Fear of current or ex partner: Not on file    Emotionally abused: Not on file    Physically abused: Not on file    Forced sexual activity: Not on file  Other Topics Concern  . Not on file  Social History Narrative   Married   2 children born 2010 and 2011, both girls   Works as an Airline pilot for an [employer]   Enjoys walking, running, spending time with her family   12/22/2016    Past  Surgical History:  Procedure Laterality Date  . FOOT SURGERY Left 2014    Family History  Problem Relation Age of Onset  . Arthritis Father   . Hyperlipidemia Father   . Hypertension Father   . Mental illness Father   . Diabetes Father   . Hyperlipidemia Maternal Grandmother   . Arthritis Maternal Grandmother   . Breast cancer Maternal Grandmother   . Ovarian cancer Maternal Grandmother   . Hypertension Maternal Grandmother   . Mental illness Maternal Grandmother   . Diabetes Maternal Grandmother   . Cancer Maternal Grandmother 50       Breast  . Arthritis Maternal Grandfather   . Mental illness Paternal Grandmother   . Hypertension Paternal Grandmother   . Arthritis Paternal Grandmother   . Arthritis Paternal Grandfather   . Mental illness Paternal Grandfather   . Hypertension Paternal Grandfather     Allergies  Allergen Reactions  . Lamotrigine Hives  .  Vancomycin Swelling  . Septra [Sulfamethoxazole-Trimethoprim] Rash    Current Outpatient Medications on File Prior to Visit  Medication Sig Dispense Refill  . BLISOVI 24 FE 1-20 MG-MCG(24) tablet Take 1 tablet by mouth daily.  2  . Probiotic Product (PROBIOTIC DAILY PO) Take by mouth daily.     No current facility-administered medications on file prior to visit.     BP 118/68   Pulse 66   Temp 98 F (36.7 C) (Oral)   Ht 5\' 2"  (1.575 m)   Wt 177 lb 8 oz (80.5 kg)   SpO2 99%   BMI 32.47 kg/m    Objective:   Physical Exam  Cardiovascular: Normal rate.  Pulses:      Dorsalis pedis pulses are 2+ on the right side, and 2+ on the left side.       Posterior tibial pulses are 2+ on the right side, and 2+ on the left side.  Respiratory: Effort normal.  Musculoskeletal:       Back:       Legs: Slight decreased strength to right great toe. Negative straight leg raise bilaterally. Ambulates well in clinic.   Skin: Skin is warm and dry.           Assessment & Plan:

## 2018-08-15 NOTE — Assessment & Plan Note (Signed)
Intermittent during hot days, seasonal changes, some exercise. No wheezing on exam. Refill provided for albuterol. Discussed to notify if she requires use of this for more than three days weekly on average.

## 2018-08-15 NOTE — Patient Instructions (Signed)
Start prednisone tablets for sciatica. Take 2 tablets daily for 4 days, then 1 tablet daily for 4 days.  Please notify me if you continue to use your albuterol inhaler more than three times weekly.   Complete xray(s) prior to leaving today. I will notify you of your results once received.  It was a pleasure to see you today!   Back Exercises The following exercises strengthen the muscles that help to support the back. They also help to keep the lower back flexible. Doing these exercises can help to prevent back pain or lessen existing pain. If you have back pain or discomfort, try doing these exercises 2-3 times each day or as told by your health care provider. When the pain goes away, do them once each day, but increase the number of times that you repeat the steps for each exercise (do more repetitions). If you do not have back pain or discomfort, do these exercises once each day or as told by your health care provider. Exercises Single Knee to Chest  Repeat these steps 3-5 times for each leg: 1. Lie on your back on a firm bed or the floor with your legs extended. 2. Bring one knee to your chest. Your other leg should stay extended and in contact with the floor. 3. Hold your knee in place by grabbing your knee or thigh. 4. Pull on your knee until you feel a gentle stretch in your lower back. 5. Hold the stretch for 10-30 seconds. 6. Slowly release and straighten your leg.  Pelvic Tilt  Repeat these steps 5-10 times: 1. Lie on your back on a firm bed or the floor with your legs extended. 2. Bend your knees so they are pointing toward the ceiling and your feet are flat on the floor. 3. Tighten your lower abdominal muscles to press your lower back against the floor. This motion will tilt your pelvis so your tailbone points up toward the ceiling instead of pointing to your feet or the floor. 4. With gentle tension and even breathing, hold this position for 5-10  seconds.  Cat-Cow  Repeat these steps until your lower back becomes more flexible: 1. Get into a hands-and-knees position on a firm surface. Keep your hands under your shoulders, and keep your knees under your hips. You may place padding under your knees for comfort. 2. Let your head hang down, and point your tailbone toward the floor so your lower back becomes rounded like the back of a cat. 3. Hold this position for 5 seconds. 4. Slowly lift your head and point your tailbone up toward the ceiling so your back forms a sagging arch like the back of a cow. 5. Hold this position for 5 seconds.  Press-Ups  Repeat these steps 5-10 times: 1. Lie on your abdomen (face-down) on the floor. 2. Place your palms near your head, about shoulder-width apart. 3. While you keep your back as relaxed as possible and keep your hips on the floor, slowly straighten your arms to raise the top half of your body and lift your shoulders. Do not use your back muscles to raise your upper torso. You may adjust the placement of your hands to make yourself more comfortable. 4. Hold this position for 5 seconds while you keep your back relaxed. 5. Slowly return to lying flat on the floor.  Bridges  Repeat these steps 10 times: 1. Lie on your back on a firm surface. 2. Bend your knees so they are pointing toward  the ceiling and your feet are flat on the floor. 3. Tighten your buttocks muscles and lift your buttocks off of the floor until your waist is at almost the same height as your knees. You should feel the muscles working in your buttocks and the back of your thighs. If you do not feel these muscles, slide your feet 1-2 inches farther away from your buttocks. 4. Hold this position for 3-5 seconds. 5. Slowly lower your hips to the starting position, and allow your buttocks muscles to relax completely.  If this exercise is too easy, try doing it with your arms crossed over your chest. Abdominal Crunches  Repeat  these steps 5-10 times: 1. Lie on your back on a firm bed or the floor with your legs extended. 2. Bend your knees so they are pointing toward the ceiling and your feet are flat on the floor. 3. Cross your arms over your chest. 4. Tip your chin slightly toward your chest without bending your neck. 5. Tighten your abdominal muscles and slowly raise your trunk (torso) high enough to lift your shoulder blades a tiny bit off of the floor. Avoid raising your torso higher than that, because it can put too much stress on your low back and it does not help to strengthen your abdominal muscles. 6. Slowly return to your starting position.  Back Lifts Repeat these steps 5-10 times: 1. Lie on your abdomen (face-down) with your arms at your sides, and rest your forehead on the floor. 2. Tighten the muscles in your legs and your buttocks. 3. Slowly lift your chest off of the floor while you keep your hips pressed to the floor. Keep the back of your head in line with the curve in your back. Your eyes should be looking at the floor. 4. Hold this position for 3-5 seconds. 5. Slowly return to your starting position.  Contact a health care provider if:  Your back pain or discomfort gets much worse when you do an exercise.  Your back pain or discomfort does not lessen within 2 hours after you exercise. If you have any of these problems, stop doing these exercises right away. Do not do them again unless your health care provider says that you can. Get help right away if:  You develop sudden, severe back pain. If this happens, stop doing the exercises right away. Do not do them again unless your health care provider says that you can. This information is not intended to replace advice given to you by your health care provider. Make sure you discuss any questions you have with your health care provider. Document Released: 12/08/2004 Document Revised: 03/09/2016 Document Reviewed: 12/25/2014 Elsevier Interactive  Patient Education  2017 [employer].

## 2018-08-23 ENCOUNTER — Telehealth: Payer: Self-pay

## 2018-08-23 DIAGNOSIS — M5431 Sciatica, right side: Secondary | ICD-10-CM

## 2018-08-23 NOTE — Telephone Encounter (Signed)
Spoken and notified patient of Katherine Ruiz comments. Patient stated numbness/tingling is not better. She is agreeable to PT

## 2018-08-23 NOTE — Telephone Encounter (Signed)
Noted, referral placed.  

## 2018-08-23 NOTE — Telephone Encounter (Signed)
Copied from CRM [id number]. Topic: General - Other >> Aug 23, 2018  8:08 AM Katherine Ruiz wrote: Reason for CRM:   Patient stated she is still experiencing numbness and tingling in her right foot from taking the Prednisone medication she started on Friday, Oct 4th.  She has two tablets left.  Please advise.

## 2018-08-23 NOTE — Telephone Encounter (Signed)
Is the numbness/tingling any better at all? Is she agreeable to physical therapy? This is the next best step.

## 2018-08-24 ENCOUNTER — Telehealth: Payer: Self-pay | Admitting: Primary Care

## 2018-08-24 DIAGNOSIS — M5431 Sciatica, right side: Secondary | ICD-10-CM

## 2018-08-24 MED ORDER — METHOCARBAMOL 500 MG PO TABS: 500.0000 mg | ORAL_TABLET | Freq: Three times a day (TID) | ORAL | 0 refills | Status: DC | PRN

## 2018-08-24 MED ORDER — DICLOFENAC SODIUM 75 MG PO TBEC: 75.0000 mg | DELAYED_RELEASE_TABLET | Freq: Two times a day (BID) | ORAL | 0 refills | Status: DC | PRN

## 2018-08-24 NOTE — Telephone Encounter (Signed)
Please notify patient that I sent in a prescription for methocarbamol muscle relaxer. Take 1 tablet by mouth every 8 hours as needed. Caution as it may cause drowsiness. I also sent in a medication called diclofenac for pain and inflammation. Take 1 tablet by mouth twice daily as needed.

## 2018-08-24 NOTE — Telephone Encounter (Signed)
Called the patient to set up her PT.She says she is having terrible stabbing pain that shoots up from her R foot all the way up her R leg. Can you call her in something for the pain and also a muscle relaxer? She says its really bad at night. Please call her with what you can do for her. PT set up for next Wednesday 08/29/18.

## 2018-08-24 NOTE — Telephone Encounter (Signed)
Spoken and notified patient of [name]'s comments. Patient verbalized understanding.  

## 2018-08-29 MED ORDER — MELOXICAM 15 MG PO TABS: ORAL_TABLET | ORAL | 0 refills | Status: DC

## 2018-08-29 NOTE — Telephone Encounter (Signed)
Pt called stating the diclofenac and methocarbamol are not really offering relief. She is asking if there is another medication she can try - she also mentioned she had PT once this week and 2 scheduled for next week. Please advise.  [id number] [address], [place] [phone] [address]

## 2018-08-29 NOTE — Telephone Encounter (Addendum)
We can switch to Meloxicam for pain, it's one tablet once daily as needed for pain. Also have her add in Tylenol 1000 mg twice daily as needed for pain. Do not take diclofenac, Ibuprofen, Aleve with Meloxicam. Continue methocarbamol muscle relaxer.

## 2018-08-29 NOTE — Addendum Note (Signed)
Addended by: Doreene Nest on: 08/29/2018 04:56 PM   Modules accepted: Orders

## 2018-08-29 NOTE — Telephone Encounter (Signed)
Spoken and notified patient of Katherine Ruiz's comments. Patient verbalized understanding.  

## 2018-08-31 ENCOUNTER — Telehealth: Payer: Self-pay

## 2018-08-31 NOTE — Telephone Encounter (Signed)
Spoken and notified patient of Dr Daphine Deutscher comments. Patient verbalized understanding. Schedule for patient to be seen on the Saturday clinic on 09/01/2018

## 2018-08-31 NOTE — Telephone Encounter (Signed)
I spoke with Katherine Ruiz; Katherine Ruiz was seen 08/15/18 and is in Katherine Ruiz; pain from mid back radiating down rt leg to foot.Katherine Ruiz also has numbness from rt shoulder to elbow. Katherine Ruiz can walk normally and can use rt hand without problem. Katherine Ruiz finished last methocarbamol last night but methocarbamol did not help pain very much.Katherine Ruiz presently taking meloxicam 15 mg taking one tab daily prn for pain and tylenol 1000 mg q6h prn for pain. These meds are not helping pain. Katherine Ruiz request cb with what to do next. [facility]. Allayne Gitelman NP out of office today.Please advise.

## 2018-08-31 NOTE — Telephone Encounter (Signed)
Copied from CRM [id number]. Topic: General - Other >> Aug 31, 2018  1:24 PM Tamela Oddi wrote: Reason for CRM: Patient called to speak with the nurse regarding managing her pain.  She states she is still in a lot of pain, even though she has been taking the medications prescribed.  Patient does not know what to do at this point.  Please call patient back to advise.  CB# [phone]

## 2018-08-31 NOTE — Telephone Encounter (Signed)
Make appt for re-eval in Saturday clinic or early next week with PCP.

## 2018-09-01 ENCOUNTER — Ambulatory Visit (HOSPITAL_COMMUNITY)
Admission: RE | Admit: 2018-09-01 | Discharge: 2018-09-01 | Disposition: A | Discharge status: Home / Self Care | Payer: BLUE CROSS/BLUE SHIELD | Source: Ambulatory Visit | Attending: Family Medicine | Admitting: Family Medicine

## 2018-09-01 ENCOUNTER — Ambulatory Visit: Payer: BLUE CROSS/BLUE SHIELD | Admitting: Family Medicine

## 2018-09-01 ENCOUNTER — Encounter: Payer: Self-pay | Admitting: Family Medicine

## 2018-09-01 VITALS — BP 110/80 | HR 63 | Temp 98.1°F | Ht 62.0 in | Wt 179.8 lb

## 2018-09-01 DIAGNOSIS — M5412 Radiculopathy, cervical region: Secondary | ICD-10-CM

## 2018-09-01 DIAGNOSIS — M5431 Sciatica, right side: Secondary | ICD-10-CM

## 2018-09-01 DIAGNOSIS — G8929 Other chronic pain: Secondary | ICD-10-CM | POA: Diagnosis not present

## 2018-09-01 HISTORY — DX: Radiculopathy, cervical region: M54.12

## 2018-09-01 LAB — POCT URINE PREGNANCY: PREG TEST UR: NEGATIVE

## 2018-09-01 MED ORDER — GABAPENTIN 100 MG PO CAPS: 100.0000 mg | ORAL_CAPSULE | Freq: Three times a day (TID) | ORAL | 0 refills | Status: DC

## 2018-09-01 NOTE — Progress Notes (Signed)
Marikay Alar, MD Phone: [phone]  Katherine Ruiz is a 27 y.o. female who presents today for same day visit.   CC: Cervical radiculopathy, right-sided sciatica  Patient has a long history of right-sided sciatica with discomfort in her buttock shooting down the back of her right leg.  She notes recently she has had decreased sensation and tingling starting in her right great toe though it is progressively moved across her foot to encompass her entire foot.  She notes after driving she feels like there is a numbness and compression stocking sensation over her right lower leg.  She additionally notes she has developed tingling and numbness over her triceps muscle on the right from her right shoulder.  She notes no injuries.  She notes no weakness.  She has tried multiple treatments including prednisone and multiple anti-inflammatories as well as a muscle relaxer with no benefit.  Denies saddle anesthesia and bowel and bladder incontinence.  No back pain or neck pain.  Social History   Tobacco Use  Smoking Status Former Smoker  Smokeless Tobacco Never Used     ROS see history of present illness  Objective  Physical Exam Vitals:   09/01/18 1101  BP: 110/80  Pulse: 63  Temp: 98.1 F (36.7 C)  SpO2: 99%    BP Readings from Last 3 Encounters:  09/01/18 110/80  08/15/18 118/68  02/26/18 118/66   Wt Readings from Last 3 Encounters:  09/01/18 179 lb 12 oz (81.5 kg)  08/15/18 177 lb 8 oz (80.5 kg)  02/26/18 200 lb (90.7 kg)    Physical Exam  Constitutional: No distress.  Cardiovascular: Normal rate, regular rhythm and normal heart sounds.  Pulmonary/Chest: Effort normal and breath sounds normal.  Musculoskeletal: She exhibits no edema.  Neurological: She is alert.  CN 2-12 intact, 5/5 strength in bilateral biceps, triceps, grip, quads, hamstrings, plantar and dorsiflexion, sensation to light touch slightly decreased over posterior right triceps, right foot, medial and  anterior right lower leg, otherwise intact in bilateral UE and LE, normal gait, 2+ patellar, biceps, and brachioradialis reflexes  Skin: Skin is warm and dry. She is not diaphoretic.     Assessment/Plan: Please see individual problem list.  Sciatica of right side Chronic intermittent issue.  She has had increase in the tingling and numbness in her right foot.  She needs to have an MRI to evaluate her lumbar spine.  She reports she is waiting on completion of physical therapy prior to having this done.  I will forward my note to the patient's PCP so they can help arrange for an MRI for the patient.  She is given return precautions.  We will treat symptoms with gabapentin.  She will discontinue anti-inflammatories as they have not been beneficial.  Cervical radiculopathy This is a new issue.  Patient with symptoms of cervical radiculopathy.  Cervical spine x-ray to be completed today.  She will likely need an MRI.  Treating symptoms with gabapentin.  Given return precautions.    Orders Placed This Encounter  Procedures  . DG Cervical Spine Complete    Standing Status:   Future    Standing Expiration Date:   11/02/2019    Order Specific Question:   Reason for Exam (SYMPTOM  OR DIAGNOSIS REQUIRED)    Answer:   cervical radicular symptoms to triceps area on right    Order Specific Question:   Is patient pregnant?    Answer:   No    Order Specific Question:   Preferred  imaging location?    Answer:   [facility]    Order Specific Question:   Radiology Contrast Protocol - do NOT remove file path    Answer:   [email or web address].pdf  . POCT urine pregnancy    Meds ordered this encounter  Medications  . gabapentin (NEURONTIN) 100 MG capsule    Sig: Take 1 capsule (100 mg total) by mouth 3 (three) times daily.    Dispense:  90 capsule    Refill:  0     Marikay Alar, MD [facility]

## 2018-09-01 NOTE — Assessment & Plan Note (Addendum)
This is a new issue.  Patient with symptoms of cervical radiculopathy.  Cervical spine x-ray to be completed today.  She will likely need an MRI.  Treating symptoms with gabapentin.  Given return precautions.

## 2018-09-01 NOTE — Assessment & Plan Note (Addendum)
Chronic intermittent issue.  She has had increase in the tingling and numbness in her right foot.  She needs to have an MRI to evaluate her lumbar spine.  She reports she is waiting on completion of physical therapy prior to having this done.  I will forward my note to the patient's PCP so they can help arrange for an MRI for the patient.  She is given return precautions.  We will treat symptoms with gabapentin.  She will discontinue anti-inflammatories as they have not been beneficial.

## 2018-09-01 NOTE — Patient Instructions (Signed)
Nice to see you. We will get an x-ray today and contact you with the results. If your symptoms progress please go to the emergency room.  If you develop numbness between your legs or bowel or bladder incontinence or any new symptoms please be reevaluated.

## 2018-09-04 ENCOUNTER — Ambulatory Visit: Payer: BLUE CROSS/BLUE SHIELD | Admitting: Primary Care

## 2018-09-04 DIAGNOSIS — M5431 Sciatica, right side: Secondary | ICD-10-CM

## 2018-09-05 DIAGNOSIS — M5431 Sciatica, right side: Secondary | ICD-10-CM

## 2018-09-05 MED ORDER — METHOCARBAMOL 500 MG PO TABS: 500.0000 mg | ORAL_TABLET | Freq: Two times a day (BID) | ORAL | 0 refills | Status: DC | PRN

## 2018-09-05 NOTE — Telephone Encounter (Signed)
Just wanted to send you all the notes from patient and I today and when she is scheduled. Thank you

## 2018-09-13 DIAGNOSIS — M5431 Sciatica, right side: Secondary | ICD-10-CM

## 2018-11-19 ENCOUNTER — Ambulatory Visit (INDEPENDENT_AMBULATORY_CARE_PROVIDER_SITE_OTHER): Payer: BLUE CROSS/BLUE SHIELD | Admitting: Neurology

## 2018-11-19 ENCOUNTER — Ambulatory Visit: Payer: BLUE CROSS/BLUE SHIELD | Admitting: Neurology

## 2018-11-19 ENCOUNTER — Encounter: Payer: Self-pay | Admitting: Neurology

## 2018-11-19 DIAGNOSIS — M79604 Pain in right leg: Secondary | ICD-10-CM

## 2018-11-19 DIAGNOSIS — M5431 Sciatica, right side: Secondary | ICD-10-CM

## 2018-11-19 NOTE — Procedures (Signed)
     HISTORY:  Katherine Ruiz is a 28 year old patient with a 3-year history of right leg pain that was intermittent, the symptoms have become more persistent and more significant since September 2019.  The patient has pain in the right buttock going down the leg to the big toe on the right.  The patient is being evaluated for a possible neuropathy or a radiculopathy.  NERVE CONDUCTION STUDIES:  Nerve conduction studies were performed on the right lower extremity. The distal motor latencies and motor amplitudes for the peroneal and posterior tibial nerves were within normal limits. The nerve conduction velocities for these nerves were also normal. The sensory latencies for the peroneal and sural nerves were within normal limits. The F wave latency for the posterior tibial nerve was within normal limits.   EMG STUDIES:  EMG study was performed on the right lower extremity:  The tibialis anterior muscle reveals 2 to 4K motor units with full recruitment. No fibrillations or positive waves were seen. The peroneus tertius muscle reveals 2 to 4K motor units with full recruitment. No fibrillations or positive waves were seen. The medial gastrocnemius muscle reveals 1 to 3K motor units with full recruitment. No fibrillations or positive waves were seen. The vastus lateralis muscle reveals 2 to 4K motor units with full recruitment. No fibrillations or positive waves were seen. The iliopsoas muscle reveals 2 to 4K motor units with full recruitment. No fibrillations or positive waves were seen. The biceps femoris muscle (long head) reveals 2 to 4K motor units with full recruitment. No fibrillations or positive waves were seen. The lumbosacral paraspinal muscles were tested at 3 levels, and revealed no abnormalities of insertional activity at all 3 levels tested. There was good relaxation.   IMPRESSION:  Nerve conduction studies done on the right lower extremity were unremarkable, no evidence of a  neuropathy is seen.  EMG evaluation of the right lower extremity was unremarkable, no evidence of an overlying lumbosacral radiculopathy was seen.  Marlan Palau MD 11/19/2018 3:52 PM  [facility] [address] [place], [place] [address]  Phone [phone] Fax [phone]

## 2018-11-19 NOTE — Progress Notes (Signed)
Please refer to EMG and nerve conduction procedure note.  

## 2018-11-19 NOTE — Progress Notes (Signed)
[facility]    Nerve / Sites Muscle Latency Ref. Amplitude Ref. Rel Amp Segments Distance Velocity Ref. Area    ms ms mV mV %  cm m/s m/s mVms  R Peroneal - EDB     Ankle EDB 5.1 ?6.5 5.6 ?2.0 100 Ankle - EDB 9   17.3     Fib head EDB 11.0  5.3  94.8 Fib head - Ankle 28 47 ?44 17.4     Pop fossa EDB 13.2  5.4  101 Pop fossa - Fib head 10 45 ?44 17.5         Pop fossa - Ankle      R Tibial - AH     Ankle AH 4.1 ?5.8 18.0 ?4.0 100 Ankle - AH 9   45.6     Pop fossa AH 11.7  12.2  67.8 Pop fossa - Ankle 32 42 ?41 32.5         SNC    Nerve / Sites Rec. Site Peak Lat Ref.  Amp Ref. Segments Distance    ms ms V V  cm  R Sural - Ankle (Calf)     Calf Ankle 3.5 ?4.4 10 ?6 Calf - Ankle 14  R Superficial peroneal - Ankle     Lat leg Ankle 3.9 ?4.4 7 ?6 Lat leg - Ankle 14         F  Wave    Nerve F Lat Ref.   ms ms  R Tibial - AH 46.1 ?56.0

## 2018-12-19 ENCOUNTER — Ambulatory Visit: Payer: BLUE CROSS/BLUE SHIELD | Admitting: Primary Care

## 2018-12-19 ENCOUNTER — Encounter: Payer: Self-pay | Admitting: Primary Care

## 2018-12-19 VITALS — BP 118/76 | HR 78 | Temp 98.1°F | Ht 62.0 in | Wt 196.0 lb

## 2018-12-19 DIAGNOSIS — F411 Generalized anxiety disorder: Secondary | ICD-10-CM | POA: Diagnosis not present

## 2018-12-19 DIAGNOSIS — F331 Major depressive disorder, recurrent, moderate: Secondary | ICD-10-CM

## 2018-12-19 DIAGNOSIS — F4323 Adjustment disorder with mixed anxiety and depressed mood: Secondary | ICD-10-CM | POA: Insufficient documentation

## 2018-12-19 MED ORDER — SERTRALINE HCL 50 MG PO TABS: 50.0000 mg | ORAL_TABLET | Freq: Every day | ORAL | 1 refills | Status: DC

## 2018-12-19 NOTE — Patient Instructions (Signed)
Start sertraline (Zoloft) 50 mg tablets for anxiety and depression. Start by taking 1/2 tablet daily for 8 days, then increase to 1 full tablet thereafter.  You will be contacted regarding your referral to therapy.  Please let us know if you have not been contacted within one week.   Schedule a follow up visit for 6 weeks for re-evaluation of anxiety and depression. Message me sooner if you have any problems.  It was a pleasure to see you today!

## 2018-12-19 NOTE — Progress Notes (Signed)
Subjective:    Patient ID: Katherine Ruiz, female    DOB: [birth year], 28 y.o.   MRN: [id number]  HPI  Katherine Ruiz is a 28 year old female with a history of depressive disorder who presents today with a chief complaint of depression and anxiety.  She has a prior history of depressive disorder and was once managed on Lamictal, Depakote, and Zoloft, followed with psychiatry at that time. She's been off of these medications since April 2016 and up until recently has done well.  Symptoms have been present for the last 2 years and include feeling easily  overwhelmed, tearfulness, feeling anxious/nervous, feeling down/sad. She denies changes in her life, kids are doing well. She does have a lot of stress at work. Her 30 year old son has noticed that she isn't participating in some family activities.   GAD 7 score of 18 and PHQ 9 score of 19 today. Denies SI/HI.   Review of Systems  Respiratory: Negative for shortness of breath.   Cardiovascular: Negative for chest pain.  Psychiatric/Behavioral:       See HPI       Past Medical History:  Diagnosis Date  . Asthma   . Chickenpox   . Depression   . History of UTI   . Kidney stone   . MRSA (methicillin resistant staph aureus) culture positive      Social History   Socioeconomic History  . Marital status: Married    Spouse name: Not on file  . Number of children: Not on file  . Years of education: Not on file  . Highest education level: Not on file  Occupational History  . Not on file  Social Needs  . Financial resource strain: Not on file  . Food insecurity:    Worry: Not on file    Inability: Not on file  . Transportation needs:    Medical: Not on file    Non-medical: Not on file  Tobacco Use  . Smoking status: Former Games developermoker  . Smokeless tobacco: Never Used  Substance and Sexual Activity  . Alcohol use: No    Alcohol/week: 0.0 standard drinks  . Drug use: No  . Sexual activity: Not Currently  Lifestyle  . Physical  activity:    Days per week: Not on file    Minutes per session: Not on file  . Stress: Not on file  Relationships  . Social connections:    Talks on phone: Not on file    Gets together: Not on file    Attends religious service: Not on file    Active member of club or organization: Not on file    Attends meetings of clubs or organizations: Not on file    Relationship status: Not on file  . Intimate partner violence:    Fear of current or ex partner: Not on file    Emotionally abused: Not on file    Physically abused: Not on file    Forced sexual activity: Not on file  Other Topics Concern  . 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 FOOT SURGERY Left 2014    Family History  Problem Relation Age of Onset  . Arthritis Father   .  Hyperlipidemia Father   . Hypertension Father   . Mental illness Father   . Diabetes Father   . Hyperlipidemia Maternal Grandmother   . Arthritis Maternal Grandmother   . Breast cancer Maternal Grandmother   . Ovarian cancer Maternal Grandmother   . Hypertension Maternal Grandmother   . Mental illness Maternal Grandmother   . Diabetes Maternal Grandmother   . Cancer Maternal Grandmother 50       Breast  . Arthritis Maternal Grandfather   . Mental illness Paternal Grandmother   . Hypertension Paternal Grandmother   . Arthritis Paternal Grandmother   . Arthritis Paternal Grandfather   . Mental illness Paternal Grandfather   . Hypertension Paternal Grandfather     Allergies  Allergen Reactions  . Lamotrigine Hives  . Vancomycin Swelling  . Septra [Sulfamethoxazole-Trimethoprim] Rash    Current Outpatient Medications on File Prior to Visit  Medication Sig Dispense Refill  . albuterol (PROVENTIL HFA;VENTOLIN HFA)  108 (90 Base) MCG/ACT inhaler Inhale 1-2 puffs into the lungs every 6 (six) hours as needed for wheezing or shortness of breath. 1 Inhaler 0  . gabapentin (NEURONTIN) 300 MG capsule     . Probiotic Product (PROBIOTIC DAILY PO) Take by mouth daily.     No current facility-administered medications on file prior to visit.     BP 118/76   Pulse 78   Temp 98.1 F (36.7 C) (Oral)   Ht 5\' 2"  (1.575 m)   Wt 196 lb (88.9 kg)   SpO2 98%   BMI 35.85 kg/m    Objective:   Physical Exam  Constitutional: She appears well-nourished.  Neck: Neck supple.  Cardiovascular: Normal rate and regular rhythm.  Respiratory: Effort normal and breath sounds normal.  Skin: Skin is warm and dry.  Psychiatric: She has a normal mood and affect.           Assessment & Plan:

## 2018-12-19 NOTE — Assessment & Plan Note (Signed)
Chronic, recurrent for the last one year. GAD 7 score of 18 and PHQ 9 score of 19 today. Denies SI/HI.  Discussed options for treatment, she opts for both therapy and medication. Referral placed to therapy, Rx for Zoloft 50 mg sent to pharmacy.  Patient is to take 1/2 tablet daily for 8 days, then advance to 1 full tablet thereafter. We discussed possible side effects of headache, GI upset, drowsiness, and SI/HI. If thoughts of SI/HI develop, we discussed to present to the emergency immediately. Patient verbalized understanding.   Follow up in 6 weeks for re-evaluation.

## 2018-12-19 NOTE — Assessment & Plan Note (Addendum)
Chronic, recurrent for the last one year. GAD 7 score of 18 and PHQ 9 score of 19 today. Denies SI/HI.  Discussed options for treatment, she opts for both therapy and medication. Referral placed to therapy, Rx for Zoloft 50 mg sent to pharmacy.  Patient is to take 1/2 tablet daily for 8 days, then advance to 1 full tablet thereafter. We discussed possible side effects of headache, GI upset, drowsiness, and SI/HI. If thoughts of SI/HI develop, we discussed to present to the emergency immediately. Patient verbalized understanding.   Follow up in 6 weeks for re-evaluation.   

## 2019-01-25 ENCOUNTER — Other Ambulatory Visit: Payer: Self-pay

## 2019-01-25 ENCOUNTER — Ambulatory Visit: Payer: BLUE CROSS/BLUE SHIELD | Admitting: Psychology

## 2019-01-25 DIAGNOSIS — F411 Generalized anxiety disorder: Secondary | ICD-10-CM

## 2019-01-28 ENCOUNTER — Telehealth: Payer: Self-pay | Admitting: Primary Care

## 2019-01-28 NOTE — Telephone Encounter (Signed)
Called to move 3/20 appt to morning per Chan °

## 2019-01-29 NOTE — Telephone Encounter (Signed)
Called patient and was able to reschedule

## 2019-02-01 ENCOUNTER — Other Ambulatory Visit: Payer: Self-pay

## 2019-02-01 ENCOUNTER — Encounter: Payer: Self-pay | Admitting: Primary Care

## 2019-02-01 ENCOUNTER — Ambulatory Visit: Payer: BLUE CROSS/BLUE SHIELD | Admitting: Primary Care

## 2019-02-01 VITALS — BP 122/82 | HR 66 | Temp 98.2°F | Wt 192.0 lb

## 2019-02-01 DIAGNOSIS — F331 Major depressive disorder, recurrent, moderate: Secondary | ICD-10-CM | POA: Diagnosis not present

## 2019-02-01 DIAGNOSIS — F411 Generalized anxiety disorder: Secondary | ICD-10-CM

## 2019-02-01 MED ORDER — CITALOPRAM HYDROBROMIDE 20 MG PO TABS: 20.0000 mg | ORAL_TABLET | Freq: Every day | ORAL | 0 refills | Status: DC

## 2019-02-01 NOTE — Assessment & Plan Note (Signed)
No improvement with Zoloft, will discontinue. She does enjoy her therapist so we will continue.   Start citalopram daily.  We will have her back in 2 months for follow up. She will call sooner if any concerns.  Denies SI/HI.

## 2019-02-01 NOTE — Patient Instructions (Signed)
Stop sertraline (Zoloft) for anxiety and depression. Start citalopram (Celexa) for anxiety and depression. Take once daily.  Please schedule a follow up appointment in 2 months for anxiety and depression.  It was a pleasure to see you today!

## 2019-02-01 NOTE — Progress Notes (Signed)
Subjective:    Patient ID: Katherine Ruiz, female    DOB: 10/20/1991, 28 y.o.   MRN: [id number]  HPI  Katherine Ruiz is a 28 year old female who presents today for follow up of anxiety and depression.  She was last evaluated on 12/19/18 with symptoms of feeling overwhelmed easily, tearfulness, feeling down/sad/anxious/nervous. History of this dating back years ago and once followed with psychiatry. GAD 7 score of 18 and PHQ 9 score of 19 so she was initiated on Zoloft 50 mg and referred to therapy.  Since her last visit she's not noticed any difference in Zoloft. Has noticed some improvement with therapy. She continues to feel anxiety, feels overwhelmed with day to day tasks, irritability. She denies SI/HI, GI upset, headaches.  Review of Systems  Respiratory: Negative for shortness of breath.   Cardiovascular: Negative for chest pain.  Gastrointestinal: Negative for nausea.  Neurological: Negative for headaches.  Psychiatric/Behavioral: The patient is nervous/anxious.        See HPI       Past Medical History:  Diagnosis Date  . Asthma   . Chickenpox   . Depression   . History of UTI   . Kidney stone   . MRSA (methicillin resistant staph aureus) culture positive      Social History   Socioeconomic History  . Marital status: Married    Spouse name: Not on file  . Number of children: Not on file  . Years of education: Not on file  . Highest education level: Not on file  Occupational History  . Not on file  Social Needs  . Financial resource strain: Not on file  . Food insecurity:    Worry: Not on file    Inability: Not on file  . Transportation needs:    Medical: Not on file    Non-medical: Not on file  Tobacco Use  . Smoking status: Former Games developer  . Smokeless tobacco: Never Used  Substance and Sexual Activity  . Alcohol use: No    Alcohol/week: 0.0 standard drinks  . Drug use: No  . Sexual activity: Not Currently  Lifestyle  . Physical activity:    Days per  week: Not on file    Minutes per session: Not on file  . Stress: Not on file  Relationships  . Social connections:    Talks on phone: Not on file    Gets together: Not on file    Attends religious service: Not on file    Active member of club or organization: Not on file    Attends meetings of clubs or organizations: Not on file    Relationship status: Not on file  . Intimate partner violence:    Fear of current or ex partner: Not on file    Emotionally abused: Not on file    Physically abused: Not on file    Forced sexual activity: Not on file  Other Topics Concern  . 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 FOOT SURGERY Left 2014    Family History  Problem Relation Age of Onset  . Arthritis Father   . Hyperlipidemia Father   . Hypertension Father   . Mental illness Father   .  Diabetes Father   . Hyperlipidemia Maternal Grandmother   . Arthritis Maternal Grandmother   . Breast cancer Maternal Grandmother   . Ovarian cancer Maternal Grandmother   . Hypertension Maternal Grandmother   . Mental illness Maternal Grandmother   . Diabetes Maternal Grandmother   . Cancer Maternal Grandmother 50       Breast  . Arthritis Maternal Grandfather   . Mental illness Paternal Grandmother   . Hypertension Paternal Grandmother   . Arthritis Paternal Grandmother   . Arthritis Paternal Grandfather   . Mental illness Paternal Grandfather   . Hypertension Paternal Grandfather     Allergies  Allergen Reactions  . Lamotrigine Hives  . Vancomycin Swelling  . Septra [Sulfamethoxazole-Trimethoprim] Rash    Current Outpatient Medications on File Prior to Visit  Medication Sig Dispense Refill  . albuterol (PROVENTIL HFA;VENTOLIN HFA) 108 (90 Base) MCG/ACT  inhaler Inhale 1-2 puffs into the lungs every 6 (six) hours as needed for wheezing or shortness of breath. 1 Inhaler 0  . gabapentin (NEURONTIN) 300 MG capsule     . Probiotic Product (PROBIOTIC DAILY PO) Take by mouth daily.     No current facility-administered medications on file prior to visit.     BP 122/82   Pulse 66   Temp 98.2 F (36.8 C) (Oral)   Wt 192 lb (87.1 kg)   SpO2 98%   BMI 35.12 kg/m    Objective:   Physical Exam  Constitutional: She appears well-nourished.  Neck: Neck supple.  Cardiovascular: Normal rate and regular rhythm.  Respiratory: Effort normal and breath sounds normal.  Skin: Skin is warm and dry.  Psychiatric: She has a normal mood and affect.           Assessment & Plan:

## 2019-02-01 NOTE — Assessment & Plan Note (Signed)
No improvement with Zoloft, will discontinue. She does enjoy her therapist so we will continue.   Start citalopram daily.  We will have her back in 2 months for follow up. She will call sooner if any concerns.  Denies SI/HI. 

## 2019-02-07 ENCOUNTER — Ambulatory Visit (INDEPENDENT_AMBULATORY_CARE_PROVIDER_SITE_OTHER): Payer: BLUE CROSS/BLUE SHIELD | Admitting: Psychology

## 2019-02-07 DIAGNOSIS — F411 Generalized anxiety disorder: Secondary | ICD-10-CM | POA: Diagnosis not present

## 2019-03-01 ENCOUNTER — Ambulatory Visit (INDEPENDENT_AMBULATORY_CARE_PROVIDER_SITE_OTHER): Payer: BLUE CROSS/BLUE SHIELD | Admitting: Psychology

## 2019-03-01 DIAGNOSIS — F411 Generalized anxiety disorder: Secondary | ICD-10-CM | POA: Diagnosis not present

## 2019-03-04 ENCOUNTER — Other Ambulatory Visit: Payer: Self-pay

## 2019-03-04 ENCOUNTER — Ambulatory Visit: Payer: BLUE CROSS/BLUE SHIELD | Admitting: Primary Care

## 2019-03-04 VITALS — BP 118/82 | HR 70 | Temp 98.0°F | Wt 193.0 lb

## 2019-03-04 DIAGNOSIS — L0291 Cutaneous abscess, unspecified: Secondary | ICD-10-CM | POA: Diagnosis not present

## 2019-03-04 MED ORDER — DOXYCYCLINE HYCLATE 100 MG PO TABS: 100.0000 mg | ORAL_TABLET | Freq: Two times a day (BID) | ORAL | 0 refills | Status: DC

## 2019-03-04 NOTE — Assessment & Plan Note (Signed)
Noted to right upper inner thigh. Incision and drainage performed given fluctuant characteristic of abscess.  Consent obtained. Site cleansed with Betadine solution. Site numbed with 1% lidocaine. Slight incision made into abscess with expulsion of a mild amount of purulent drainage. Site cleansed again with Betadine solution, dressing applied.  Patient tolerated well.  Prescription for doxycycline antibiotic sent to pharmacy for 10-day course.  She will update.

## 2019-03-04 NOTE — Progress Notes (Signed)
Subjective:    Patient ID: Katherine Ruiz, female    DOB: 03/07/1991, 28 y.o.   MRN: [id number]  HPI  Katherine Ruiz is a 28 year old female with a history of abscess who presents today with a chief complaint of skin infection.   Her irritation is located to the right groin that she first noticed four days ago after shaving her groin in the shower. She has a history of abscesses to her body in the past and has undergone surgical removal for such in the past.    She's applied mupirocin cream for the last four days with some reduction in erythema. She's also applied warm compresses. This morning she noticed increased erythema and pain, no drainage.   She rides her bike often, also went hiking two days ago. She denies fevers.   Review of Systems  Constitutional: Negative for fever.  Skin: Positive for color change and wound.       Past Medical History:  Diagnosis Date  . Asthma   . Chickenpox   . Depression   . History of UTI   . Kidney stone   . MRSA (methicillin resistant staph aureus) culture positive      Social History   Socioeconomic History  . Marital status: Married    Spouse name: Not on file  . Number of children: Not on file  . Years of education: Not on file  . Highest education level: Not on file  Occupational History  . Not on file  Social Needs  . Financial resource strain: Not on file  . Food insecurity:    Worry: Not on file    Inability: Not on file  . Transportation needs:    Medical: Not on file    Non-medical: Not on file  Tobacco Use  . Smoking status: Former Games developermoker  . Smokeless tobacco: Never Used  Substance and Sexual Activity  . Alcohol use: No    Alcohol/week: 0.0 standard drinks  . Drug use: No  . Sexual activity: Not Currently  Lifestyle  . Physical activity:    Days per week: Not on file    Minutes per session: Not on file  . Stress: Not on file  Relationships  . Social connections:    Talks on phone: Not on file    Gets  together: Not on file    Attends religious service: Not on file    Active member of club or organization: Not on file    Attends meetings of clubs or organizations: Not on file    Relationship status: Not on file  . Intimate partner violence:    Fear of current or ex partner: Not on file    Emotionally abused: Not on file    Physically abused: Not on file    Forced sexual activity: Not on file  Other Topics Concern  . 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 FOOT SURGERY Left 2014    Family History  Problem Relation Age of Onset  . Arthritis Father   . Hyperlipidemia Father   . Hypertension Father   . Mental illness Father   . Diabetes Father   . Hyperlipidemia Maternal Grandmother   . Arthritis Maternal Grandmother   .  Breast cancer Maternal Grandmother   . Ovarian cancer Maternal Grandmother   . Hypertension Maternal Grandmother   . Mental illness Maternal Grandmother   . Diabetes Maternal Grandmother   . Cancer Maternal Grandmother 50       Breast  . Arthritis Maternal Grandfather   . Mental illness Paternal Grandmother   . Hypertension Paternal Grandmother   . Arthritis Paternal Grandmother   . Arthritis Paternal Grandfather   . Mental illness Paternal Grandfather   . Hypertension Paternal Grandfather     Allergies  Allergen Reactions  . Lamotrigine Hives  . Vancomycin Swelling  . Septra [Sulfamethoxazole-Trimethoprim] Rash    Current Outpatient Medications on File Prior to Visit  Medication Sig Dispense Refill  . albuterol (PROVENTIL HFA;VENTOLIN HFA) 108 (90 Base) MCG/ACT inhaler Inhale 1-2 puffs into the lungs every 6 (six) hours as needed for wheezing or shortness of breath. 1 Inhaler 0  . citalopram (CELEXA) 20 MG tablet Take  1 tablet (20 mg total) by mouth daily. For anxiety. 90 tablet 0  . gabapentin (NEURONTIN) 300 MG capsule     . Probiotic Product (PROBIOTIC DAILY PO) Take by mouth daily.     No current facility-administered medications on file prior to visit.     BP 118/82   Pulse 70   Temp 98 F (36.7 C) (Oral)   Wt 193 lb (87.5 kg)   SpO2 98%   BMI 35.30 kg/m    Objective:   Physical Exam  Constitutional: She appears well-nourished.  Cardiovascular: Normal rate.  Respiratory: Effort normal.  Skin: There is erythema.  2 cm raised, fluctuant, circular abscess with firm base beneath measuring 2.5 cm to right inner thigh. 0.5 cm circular raised mildly erythematous spot adjacent. Surrounding erythema.            Assessment & Plan:

## 2019-03-04 NOTE — Patient Instructions (Signed)
Start Doxycycline antibiotic for the infection. Take 1 tablet by mouth twice daily for 10 days.  Apply warm compresses to facilitate drainage.   Please call me Friday this week if no improvement.   It was a pleasure to see you today!

## 2019-03-07 LAB — WOUND CULTURE
MICRO NUMBER:: 407110
SPECIMEN QUALITY:: ADEQUATE

## 2019-03-14 ENCOUNTER — Ambulatory Visit (INDEPENDENT_AMBULATORY_CARE_PROVIDER_SITE_OTHER): Payer: BLUE CROSS/BLUE SHIELD | Admitting: Psychology

## 2019-03-14 DIAGNOSIS — F411 Generalized anxiety disorder: Secondary | ICD-10-CM

## 2019-03-28 ENCOUNTER — Ambulatory Visit (INDEPENDENT_AMBULATORY_CARE_PROVIDER_SITE_OTHER): Payer: BLUE CROSS/BLUE SHIELD | Admitting: Psychology

## 2019-03-28 DIAGNOSIS — F411 Generalized anxiety disorder: Secondary | ICD-10-CM

## 2019-04-02 ENCOUNTER — Ambulatory Visit (INDEPENDENT_AMBULATORY_CARE_PROVIDER_SITE_OTHER): Payer: BLUE CROSS/BLUE SHIELD | Admitting: Psychology

## 2019-04-02 DIAGNOSIS — F411 Generalized anxiety disorder: Secondary | ICD-10-CM

## 2019-04-05 ENCOUNTER — Ambulatory Visit (INDEPENDENT_AMBULATORY_CARE_PROVIDER_SITE_OTHER): Payer: BLUE CROSS/BLUE SHIELD | Admitting: Primary Care

## 2019-04-05 ENCOUNTER — Encounter: Payer: Self-pay | Admitting: Primary Care

## 2019-04-05 DIAGNOSIS — F411 Generalized anxiety disorder: Secondary | ICD-10-CM

## 2019-04-05 DIAGNOSIS — F331 Major depressive disorder, recurrent, moderate: Secondary | ICD-10-CM

## 2019-04-05 NOTE — Assessment & Plan Note (Signed)
Not a significant improvement but slight. PHQ 9 score of 17 today. Increase citalopram to 40 mg daily. She will update in a few weeks. Continue therapy.

## 2019-04-05 NOTE — Assessment & Plan Note (Signed)
Slight improvement since switching to citalopram from sertraline. GAD 7 score of 13 today. Given continued anxiety and depression symptoms, increase citalopram to 40 mg. She will update in a few weeks. Continue therapy.

## 2019-04-05 NOTE — Patient Instructions (Signed)
We've increased the dose of your citalopram to 40 mg daily. You may take two of the 20 mg tablets to equal 40 mg until your bottle is empty.  Please send me an update in 3-4 weeks.   We will see you in a few months for your physical as discussed today.  It was a pleasure to see you today! Mayra Reel, NP-C

## 2019-04-05 NOTE — Progress Notes (Signed)
Subjective:    Patient ID: Katherine Ruiz, female    DOB: 1990-12-25, 28 y.o.   MRN: [id number]  HPI  Virtual Visit via Video Note  I connected with Katherine Ruiz on 04/05/19 at  2:00 PM EDT by a video enabled telemedicine application and verified that I am speaking with the correct person using two identifiers.  Location: Patient: Home Provider: Office   I discussed the limitations of evaluation and management by telemedicine and the availability of in person appointments. The patient expressed understanding and agreed to proceed.  History of Present Illness:  [name] is a 28 year old female who presents today for follow up of anxiety disorder.   She was last evaluated evaluated for anxiety on 02/01/19 with continued symptoms of anxiety, feeling overwhelmed, irritability despite management on Zoloft. Given continued symptoms despite treatment she was switched to citalopram 20 mg and referred to therapy.  Since her last visit she's doing slightly better. She is meeting with her therapist weekly and has started meditating and using other coping exercises. She continues to feel little motivation do anything, tearfulness, feeling anxious, daily worry. GAD 7 score of 13 and PHQ 9 score of 17 today. Denies SI/HI.   Observations/Objective:  Alert and oriented. Appears well, not sickly. No distress. Speaking in complete sentences.   Assessment and Plan:  See problem based charting.  Follow Up Instructions:  We've increased the dose of your citalopram to 40 mg daily. You may take two of the 20 mg tablets to equal 40 mg until your bottle is empty.  Please send me an update in 3-4 weeks.   We will see you in a few months for your physical as discussed today.  It was a pleasure to see you today! Katherine ReelKate Tericka Devincenzi, NP-C    I discussed the assessment and treatment plan with the patient. The patient was provided an opportunity to ask questions and all were answered. The patient  agreed with the plan and demonstrated an understanding of the instructions.   The patient was advised to call back or seek an in-person evaluation if the symptoms worsen or if the condition fails to improve as anticipated.     Katherine NestKatherine K Roque Schill, NP    Review of Systems  Respiratory: Negative for shortness of breath.   Cardiovascular: Negative for chest pain.  Gastrointestinal: Negative for nausea.  Psychiatric/Behavioral: Negative for sleep disturbance and suicidal ideas. The patient is nervous/anxious.        See HPI       Past Medical History:  Diagnosis Date  . Asthma   . Chickenpox   . Depression   . History of UTI   . Kidney stone   . MRSA (methicillin resistant staph aureus) culture positive      Social History   Socioeconomic History  . Marital status: Married    Spouse name: Not on file  . Number of children: Not on file  . Years of education: Not on file  . Highest education level: Not on file  Occupational History  . Not on file  Social Needs  . Financial resource strain: Not on file  . Food insecurity:    Worry: Not on file    Inability: Not on file  . Transportation needs:    Medical: Not on file    Non-medical: Not on file  Tobacco Use  . Smoking status: Former Games developermoker  . Smokeless tobacco: Never Used  Substance and Sexual Activity  . Alcohol  use: No    Alcohol/week: 0.0 standard drinks  . Drug use: No  . Sexual activity: Not Currently  Lifestyle  . Physical activity:    Days per week: Not on file    Minutes per session: Not on file  . Stress: Not on file  Relationships  . Social connections:    Talks on phone: Not on file    Gets together: Not on file    Attends religious service: Not on file    Active member of club or organization: Not on file    Attends meetings of clubs or organizations: Not on file    Relationship status: Not on file  . Intimate partner violence:    Fear of current or ex partner: Not on file    Emotionally  abused: Not on file    Physically abused: Not on file    Forced sexual activity: Not on file  Other Topics Concern  . 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 FOOT SURGERY Left 2014    Family History  Problem Relation Age of Onset  . Arthritis Father   . Hyperlipidemia Father   . Hypertension Father   . Mental illness Father   . Diabetes Father   . Hyperlipidemia Maternal Grandmother   . Arthritis Maternal Grandmother   . Breast cancer Maternal Grandmother   . Ovarian cancer Maternal Grandmother   . Hypertension Maternal Grandmother   . Mental illness Maternal Grandmother   . Diabetes Maternal Grandmother   . Cancer Maternal Grandmother 50       Breast  . Arthritis Maternal Grandfather   . Mental illness Paternal Grandmother   . Hypertension Paternal Grandmother   . Arthritis Paternal Grandmother   . Arthritis Paternal Grandfather   . Mental illness Paternal Grandfather   . Hypertension Paternal Grandfather     Allergies  Allergen Reactions  . Lamotrigine Hives  . Vancomycin Swelling  . Septra [Sulfamethoxazole-Trimethoprim] Rash    Current Outpatient Medications on File Prior to Visit  Medication Sig Dispense Refill  . citalopram (CELEXA) 20 MG tablet Take 1 tablet (20 mg total) by mouth daily. For anxiety. 90 tablet 0  . Probiotic Product (PROBIOTIC DAILY PO) Take by mouth daily.    Marland Kitchen albuterol (PROVENTIL HFA;VENTOLIN HFA) 108 (90 Base) MCG/ACT inhaler Inhale 1-2 puffs into the lungs every 6 (six) hours as needed for wheezing or shortness of breath. (Patient not taking: Reported on 04/05/2019) 1 Inhaler 0  . gabapentin (NEURONTIN) 300 MG capsule Take 300 mg by mouth at bedtime.      No current facility-administered medications on file  prior to visit.     Wt 193 lb (87.5 kg)   BMI 35.30 kg/m    Objective:   Physical Exam  Constitutional: She is oriented to person, place, and time. She appears well-nourished.  Respiratory: Effort normal.  Neurological: She is alert and oriented to person, place, and time.  Psychiatric: She has a normal mood and affect.           Assessment & Plan:

## 2019-04-23 ENCOUNTER — Ambulatory Visit (INDEPENDENT_AMBULATORY_CARE_PROVIDER_SITE_OTHER): Payer: BC Managed Care – PPO | Admitting: Psychology

## 2019-04-23 ENCOUNTER — Telehealth: Payer: Self-pay | Admitting: Primary Care

## 2019-04-23 DIAGNOSIS — F411 Generalized anxiety disorder: Secondary | ICD-10-CM

## 2019-04-23 DIAGNOSIS — F331 Major depressive disorder, recurrent, moderate: Secondary | ICD-10-CM

## 2019-04-23 MED ORDER — CITALOPRAM HYDROBROMIDE 40 MG PO TABS: 40.0000 mg | ORAL_TABLET | Freq: Every day | ORAL | 1 refills | Status: DC

## 2019-04-23 NOTE — Telephone Encounter (Signed)
notified patient of Tawni Millers comments

## 2019-04-23 NOTE — Telephone Encounter (Signed)
Tried to call patient but could not leave message since voicemail is full 

## 2019-04-23 NOTE — Telephone Encounter (Signed)
Please notify patient that I sent citalopram 40 mg, only take one tablet once daily.

## 2019-04-23 NOTE — Telephone Encounter (Signed)
Patient stated she is needing a refill of her CELEXA.   She is needing a new prescription sent in with updated dosage. She stated that she is now taking 2 tablets instead of just of 1.   [facility] DrLorina Rabon    Patient's Phone- [phone]

## 2019-04-30 ENCOUNTER — Ambulatory Visit (INDEPENDENT_AMBULATORY_CARE_PROVIDER_SITE_OTHER): Payer: BC Managed Care – PPO | Admitting: Psychology

## 2019-04-30 DIAGNOSIS — F411 Generalized anxiety disorder: Secondary | ICD-10-CM

## 2019-05-22 ENCOUNTER — Telehealth: Payer: Self-pay | Admitting: Primary Care

## 2019-05-22 NOTE — Telephone Encounter (Signed)
Pt's sister had a baby earlier thiss week and pt would like to request a covid test. She does not have any symptoms, she goes to the grocery store and wears a mask and is careful but just wants to be careful around the newborn. She is requesting a cb.

## 2019-05-22 NOTE — Telephone Encounter (Signed)
Noted. See Gae Dry' message regarding [facility] testing tomorrow. Can you direct her?  If she can't make that then I'll send a message to the [facility] pool.

## 2019-05-22 NOTE — Telephone Encounter (Signed)
Spoken and notified patient of Tawni Millers comments. Gave patient the information regarding COVID testing tomorrow. Patient verbalized understanding.

## 2019-05-23 ENCOUNTER — Other Ambulatory Visit: Payer: Self-pay | Admitting: *Deleted

## 2019-05-23 DIAGNOSIS — Z20822 Contact with and (suspected) exposure to covid-19: Secondary | ICD-10-CM

## 2019-05-29 LAB — NOVEL CORONAVIRUS, NAA: SARS-CoV-2, NAA: NOT DETECTED

## 2019-05-30 ENCOUNTER — Ambulatory Visit (INDEPENDENT_AMBULATORY_CARE_PROVIDER_SITE_OTHER): Payer: BC Managed Care – PPO | Admitting: Psychology

## 2019-05-30 DIAGNOSIS — F411 Generalized anxiety disorder: Secondary | ICD-10-CM | POA: Diagnosis not present

## 2019-06-13 ENCOUNTER — Telehealth: Payer: Self-pay

## 2019-06-13 ENCOUNTER — Other Ambulatory Visit: Payer: Self-pay

## 2019-06-13 ENCOUNTER — Encounter: Payer: Self-pay | Admitting: Family Medicine

## 2019-06-13 ENCOUNTER — Ambulatory Visit: Payer: BC Managed Care – PPO | Admitting: Family Medicine

## 2019-06-13 VITALS — BP 94/60 | HR 96 | Temp 98.6°F | Ht 62.0 in | Wt 188.0 lb

## 2019-06-13 DIAGNOSIS — R350 Frequency of micturition: Secondary | ICD-10-CM | POA: Diagnosis not present

## 2019-06-13 DIAGNOSIS — N3090 Cystitis, unspecified without hematuria: Secondary | ICD-10-CM

## 2019-06-13 LAB — POC URINALSYSI DIPSTICK (AUTOMATED)
Bilirubin, UA: NEGATIVE
Glucose, UA: NEGATIVE
Ketones, UA: NEGATIVE
Nitrite, UA: NEGATIVE
Protein, UA: POSITIVE — AB
Spec Grav, UA: 1.015 (ref 1.010–1.025)
Urobilinogen, UA: 0.2 E.U./dL
pH, UA: 6 (ref 5.0–8.0)

## 2019-06-13 MED ORDER — CEPHALEXIN 500 MG PO CAPS: 1000.0000 mg | ORAL_CAPSULE | Freq: Two times a day (BID) | ORAL | 0 refills | Status: AC

## 2019-06-13 NOTE — Telephone Encounter (Signed)
Pt has frequency and urgency of urine, pain and burning when urinates;when wiped blood on tissue. Lower abd pain under belly button that is constant at pain level 6 and when urinates abd pain increases; lower back pain on both sides. No abd distention, Pt has no covid symptoms, no travel and no known exposure to + covid. Pt scheduled in office visit today at 11:00 with Dr Lorelei Pont.

## 2019-06-13 NOTE — Progress Notes (Signed)
Katherine Nakamura T. [name], MD Primary Care and Sports Medicine [facility] [address] [place], [id number] Phone: [phone]  FAX: [phone]  Katherine Ruiz - 28 y.o. female  MRN [id number]  Date of Birth: [birth year]  Visit Date: 06/13/2019  PCP: Katherine Ruiz, Katherine K, NP  Referred by: Katherine Ruiz, Katherine K, NP  Chief Complaint  Patient presents with  . Dysuria  . Abdominal Pain  . Back Pain   Subjective:   This 28 y.o. female patient presents with burning, urgency. No vaginal discharge or external irritation.  No STD exposure. + suprapubic abd pain, + flank pain.  Started a few days, and dehydration. Drank a whole lot of water.  Then this morning was hurting really bad.   The PMH, PSH, Social History, Family History, Medications, and allergies [facility], and have been updated if relevant.  Patient Active Problem List   Diagnosis Date Noted  . Abscess 03/04/2019  . GAD (generalized anxiety disorder) 12/19/2018  . Cervical radiculopathy 09/01/2018  . Asthma 08/15/2018  . Sciatica of right side 08/15/2018  . Dysuria 02/26/2018  . Vaginal discharge 02/26/2018  . Major depressive disorder 11/20/2015    Past Medical History:  Diagnosis Date  . Asthma   . Chickenpox   . Depression   . History of UTI   . Kidney stone   . MRSA (methicillin resistant staph aureus) culture positive     Past Surgical History:  Procedure Laterality Date  . FOOT SURGERY Left 2014    Social History   Socioeconomic History  . Marital status: Married    Spouse name: Not on file  . Number of children: Not on file  . Years of education: Not on file  . Highest education level: Not on file  Occupational History  . Not on file  Social Needs  . Financial resource strain: Not on file  . Food insecurity    Worry: Not on file    Inability: Not on file  . Transportation needs    Medical: Not on file    Non-medical: Not on file   Tobacco Use  . Smoking status: Former Games developermoker  . Smokeless tobacco: Never Used  Substance and Sexual Activity  . Alcohol use: No    Alcohol/week: 0.0 standard drinks  . Drug use: No  . Sexual activity: Not Currently  Lifestyle  . Physical activity    Days per week: Not on file    Minutes per session: Not on file  . Stress: Not on file  Relationships  . Social Musicianconnections    Talks on phone: Not on file    Gets together: Not on file    Attends religious service: Not on file    Active member of club or organization: Not on file    Attends meetings of clubs or organizations: Not on file    Relationship status: Not on file  . Intimate partner violence    Fear of current or ex partner: Not on file    Emotionally abused: Not on file    Physically abused: Not on file    Forced sexual activity: Not on file  Other Topics Concern  . Not on file  Social History Narrative   Married   2 children born 2010 and 2011, both girls   Works as an Airline pilotAccountant for an [employer]   Enjoys walking, running, spending time with her family   12/22/2016    Family  History  Problem Relation Age of Onset  . Arthritis Father   . Hyperlipidemia Father   . Hypertension Father   . Mental illness Father   . Diabetes Father   . Hyperlipidemia Maternal Grandmother   . Arthritis Maternal Grandmother   . Breast cancer Maternal Grandmother   . Ovarian cancer Maternal Grandmother   . Hypertension Maternal Grandmother   . Mental illness Maternal Grandmother   . Diabetes Maternal Grandmother   . Cancer Maternal Grandmother 50       Breast  . Arthritis Maternal Grandfather   . Mental illness Paternal Grandmother   . Hypertension Paternal Grandmother   . Arthritis Paternal Grandmother   . Arthritis Paternal Grandfather   . Mental illness Paternal Grandfather   . Hypertension Paternal Grandfather     Allergies  Allergen Reactions  . Lamotrigine Hives  . Vancomycin Swelling  . Septra  [Sulfamethoxazole-Trimethoprim] Rash    Medication list reviewed and updated in full in [facility].  GEN:  no fevers, chills. GI: No n/v/d, eating normally Otherwise, ROS is as per the HPI.  Objective:   Blood pressure 94/60, pulse 96, temperature 98.6 F (37 C), temperature source Temporal, height 5\' 2"  (1.575 m), weight 188 lb (85.3 kg), SpO2 (!) 81 %.  GEN: WDWN, A&Ox4,NAD. Non-toxic HEENT: Atraumatc, normocephalic. CV: RRR, No M/G/R PULM: CTA B, No wheezes, crackles, or rhonchi ABD: S, NT, ND, +BS, no rebound. + CVAT. + suprapubic tenderness. EXT: No c/c/e  Objective Data: Results for orders placed or performed in visit on 06/13/19  POCT Urinalysis Dipstick (Automated)  Result Value Ref Range   Color, UA Yellow    Clarity, UA Hazy    Glucose, UA Negative Negative   Bilirubin, UA Negative    Ketones, UA Negative    Spec Grav, UA 1.015 1.010 - 1.025   Blood, UA Large    pH, UA 6.0 5.0 - 8.0   Protein, UA Positive (A) Negative   Urobilinogen, UA 0.2 0.2 or 1.0 E.U./dL   Nitrite, UA Negative    Leukocytes, UA Large (3+) (A) Negative    Assessment and Plan:     ICD-10-CM   1. Cystitis  N30.90   2. Urinary frequency  R35.0 POCT Urinalysis Dipstick (Automated)    Urine Culture   ? Early pyelo with CVAT, so 10 d abx  Rx with ABX as below. Drink plenty of fluids and supportive care.  Follow-up: No follow-ups on file.  Meds ordered this encounter  Medications  . cephALEXin (KEFLEX) 500 MG capsule    Sig: Take 2 capsules (1,000 mg total) by mouth 2 (two) times daily for 10 days.    Dispense:  40 capsule    Refill:  0   Orders Placed This Encounter  Procedures  . Urine Culture  . POCT Urinalysis Dipstick (Automated)    Signed,  Shirle Provencal T. Daiquan Resnik, MD   Patient's Medications  New Prescriptions   CEPHALEXIN (KEFLEX) 500 MG CAPSULE    Take 2 capsules (1,000 mg total) by mouth 2 (two) times daily for 10 days.  Previous Medications   ALBUTEROL  (PROVENTIL HFA;VENTOLIN HFA) 108 (90 BASE) MCG/ACT INHALER    Inhale 1-2 puffs into the lungs every 6 (six) hours as needed for wheezing or shortness of breath.   CITALOPRAM (CELEXA) 40 MG TABLET    Take 1 tablet (40 mg total) by mouth daily. For anxiety and depression.   GABAPENTIN (NEURONTIN) 300 MG CAPSULE    Take 300 mg  by mouth at bedtime.    PROBIOTIC PRODUCT (PROBIOTIC DAILY PO)    Take by mouth daily.  Modified Medications   No medications on file  Discontinued Medications   No medications on file

## 2019-06-14 LAB — URINE CULTURE
MICRO NUMBER:: 720788
SPECIMEN QUALITY:: ADEQUATE

## 2019-06-17 ENCOUNTER — Ambulatory Visit (INDEPENDENT_AMBULATORY_CARE_PROVIDER_SITE_OTHER): Payer: BC Managed Care – PPO | Admitting: Psychology

## 2019-06-17 DIAGNOSIS — F411 Generalized anxiety disorder: Secondary | ICD-10-CM

## 2019-06-18 ENCOUNTER — Telehealth: Payer: Self-pay | Admitting: Primary Care

## 2019-06-18 NOTE — Telephone Encounter (Signed)
Best number [phone]  Pt called wanting to get a rx diflucan for yeast infection  She also stated she is not feeling any better Pt still cramping and she is still having back pain.  Going to bathroom a lot.  Sometime she having incontinence.  She saw dr copland 7/30 for uti  She wants to know what she needs to do  [employer]

## 2019-06-18 NOTE — Telephone Encounter (Signed)
Patient needs reevaluation, can we get her in the office with me tomorrow?

## 2019-06-19 ENCOUNTER — Ambulatory Visit: Payer: BC Managed Care – PPO | Admitting: Primary Care

## 2019-06-19 ENCOUNTER — Other Ambulatory Visit: Payer: Self-pay

## 2019-06-19 ENCOUNTER — Telehealth: Payer: Self-pay

## 2019-06-19 ENCOUNTER — Encounter: Payer: Self-pay | Admitting: Primary Care

## 2019-06-19 VITALS — BP 112/76 | HR 90 | Temp 98.2°F | Ht 62.0 in | Wt 185.5 lb

## 2019-06-19 DIAGNOSIS — R110 Nausea: Secondary | ICD-10-CM | POA: Diagnosis not present

## 2019-06-19 DIAGNOSIS — R109 Unspecified abdominal pain: Secondary | ICD-10-CM | POA: Diagnosis not present

## 2019-06-19 DIAGNOSIS — R1030 Lower abdominal pain, unspecified: Secondary | ICD-10-CM | POA: Diagnosis not present

## 2019-06-19 DIAGNOSIS — R319 Hematuria, unspecified: Secondary | ICD-10-CM

## 2019-06-19 LAB — POC URINALSYSI DIPSTICK (AUTOMATED)
Bilirubin, UA: NEGATIVE
Glucose, UA: NEGATIVE
Ketones, UA: NEGATIVE
Nitrite, UA: NEGATIVE
Protein, UA: POSITIVE — AB
Spec Grav, UA: 1.015 (ref 1.010–1.025)
Urobilinogen, UA: 0.2 E.U./dL
pH, UA: 6.5 (ref 5.0–8.0)

## 2019-06-19 MED ORDER — ONDANSETRON 4 MG PO TBDP: 4.0000 mg | ORAL_TABLET | Freq: Three times a day (TID) | ORAL | 0 refills | Status: DC | PRN

## 2019-06-19 MED ORDER — TAMSULOSIN HCL 0.4 MG PO CAPS: 0.4000 mg | ORAL_CAPSULE | Freq: Every day | ORAL | 0 refills | Status: DC

## 2019-06-19 NOTE — Assessment & Plan Note (Signed)
No improvement after treatment during her last visit. Exam and HPI today consistent for renal stone. Wet prep collected. UA with 3+ leuks, large blood.  STAT renal stone CT scan ordered and pending. Rx for Flomax and Zofran sent to pharmacy.  Strict ED precautions provided. Await CT scan results.

## 2019-06-19 NOTE — Patient Instructions (Signed)
Stop by the front desk and speak with [facility] regarding your CT scan.  Start the tamsulosin (Flomax) medication once daily to help with urination.  You may take the ondansetron (Zofran) every 8 hours as needed for nausea.   Push intake of water.  Go to the hospital if your symptoms get worse.  It was a pleasure to see you today!

## 2019-06-19 NOTE — Telephone Encounter (Signed)
CT Abd Pelvis Call Report: No acute findings. Borderline hepatomegaly. No kidney stones.

## 2019-06-19 NOTE — Progress Notes (Signed)
Subjective:    Patient ID: Katherine Ruiz, female    DOB: [birth year], 28 y.o.   MRN: [id number]  HPI  Ms. Tafolla is a 28 year old female with a history of renal stones who presents today with a chief complaint of pelvic pain.  She was last evaluated on 06/13/2019 per Dr. Lorelei Pont for complaints of dysuria, abdominal pain, flank pain. No vaginal symptoms. UA with 3+ leuks, blood. She was presumed to have early pyelonephritis vs acute cystitis so she was treated with a cephalexin course of 10 days. Urine culture resulted later which was negative, she was advised to finish antibiotics.  Since her last visit she continues to experience suprapubic cramping, urinary incontinence if she doesn't reach a bathroom in time, difficulty urinating, hematuria, nausea, left flank pain with radiation around to her left hip. She is not on her menstrual cycle. She denies fevers, vaginal discharge.  She has experienced some vaginal itching that began several days after she began her antibiotics.   Review of Systems  Constitutional: Negative for fever.  Gastrointestinal: Positive for nausea. Negative for abdominal pain and vomiting.  Genitourinary: Positive for flank pain and hematuria. Negative for dysuria and vaginal discharge.       Past Medical History:  Diagnosis Date  . Asthma   . Chickenpox   . Depression   . History of UTI   . Kidney stone   . MRSA (methicillin resistant staph aureus) culture positive      Social History   Socioeconomic History  . Marital status: Married    Spouse name: Not on file  . Number of children: Not on file  . Years of education: Not on file  . Highest education level: Not on file  Occupational History  . Not on file  Social Needs  . Financial resource strain: Not on file  . Food insecurity    Worry: Not on file    Inability: Not on file  . Transportation needs    Medical: Not on file    Non-medical: Not on file  Tobacco Use  . Smoking status: Former  Research scientist (life sciences)  . Smokeless tobacco: Never Used  Substance and Sexual Activity  . Alcohol use: No    Alcohol/week: 0.0 standard drinks  . Drug use: No  . Sexual activity: Not Currently  Lifestyle  . Physical activity    Days per week: Not on file    Minutes per session: Not on file  . Stress: Not on file  Relationships  . Social Herbalist on phone: Not on file    Gets together: Not on file    Attends religious service: Not on file    Active member of club or organization: Not on file    Attends meetings of clubs or organizations: Not on file    Relationship status: Not on file  . Intimate partner violence    Fear of current or ex partner: Not on file    Emotionally abused: Not on file    Physically abused: Not on file    Forced sexual activity: Not on file  Other Topics Concern  . 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 FOOT SURGERY Left 2014  Family History  Problem Relation Age of Onset  . Arthritis Father   . Hyperlipidemia Father   . Hypertension Father   . Mental illness Father   . Diabetes Father   . Hyperlipidemia Maternal Grandmother   . Arthritis Maternal Grandmother   . Breast cancer Maternal Grandmother   . Ovarian cancer Maternal Grandmother   . Hypertension Maternal Grandmother   . Mental illness Maternal Grandmother   . Diabetes Maternal Grandmother   . Cancer Maternal Grandmother 50       Breast  . Arthritis Maternal Grandfather   . Mental illness Paternal Grandmother   . Hypertension Paternal Grandmother   . Arthritis Paternal Grandmother   . Arthritis Paternal Grandfather   . Mental illness Paternal Grandfather   . Hypertension Paternal Grandfather     Allergies  Allergen Reactions  . Lamotrigine Hives  .  Vancomycin Swelling  . Septra [Sulfamethoxazole-Trimethoprim] Rash    Current Outpatient Medications on File Prior to Visit  Medication Sig Dispense Refill  . albuterol (PROVENTIL HFA;VENTOLIN HFA) 108 (90 Base) MCG/ACT inhaler Inhale 1-2 puffs into the lungs every 6 (six) hours as needed for wheezing or shortness of breath. 1 Inhaler 0  . cephALEXin (KEFLEX) 500 MG capsule Take 2 capsules (1,000 mg total) by mouth 2 (two) times daily for 10 days. 40 capsule 0  . citalopram (CELEXA) 40 MG tablet Take 1 tablet (40 mg total) by mouth daily. For anxiety and depression. 90 tablet 1  . gabapentin (NEURONTIN) 300 MG capsule Take 300 mg by mouth at bedtime.     . Probiotic Product (PROBIOTIC DAILY PO) Take by mouth daily.     No current facility-administered medications on file prior to visit.     BP 112/76   Pulse 90   Temp 98.2 F (36.8 C) (Temporal)   Ht 5\' 2"  (1.575 m)   Wt 185 lb 8 oz (84.1 kg)   SpO2 97%   BMI 33.93 kg/m    Objective:   Physical Exam  Constitutional: She appears well-nourished.  Cardiovascular: Normal rate.  Respiratory: Effort normal.  GI: Soft. Normal appearance. There is abdominal tenderness in the suprapubic area. There is CVA tenderness.    Genitourinary: Cervix exhibits no motion tenderness.    Vaginal discharge present.     Genitourinary Comments: Dark red/green discharge, also mild whitish vaginal discharge   Skin: Skin is warm and dry.           Assessment & Plan:

## 2019-06-19 NOTE — Telephone Encounter (Signed)
Spoken and notified patient of Katherine Ruiz's comments. Patient verbalized understanding.  

## 2019-06-19 NOTE — Telephone Encounter (Signed)
Please notify patient that I was notified that her CT scan did not show kidney stones or other acute findings. Vaginal swabs pending, will notify once received. Do not need to take tamsulosin.

## 2019-06-19 NOTE — Addendum Note (Signed)
Addended by: Jacqualin Combes on: 06/19/2019 11:36 AM   Modules accepted: Orders

## 2019-06-20 LAB — WET PREP BY MOLECULAR PROBE
Candida species: NOT DETECTED
Gardnerella vaginalis: NOT DETECTED
MICRO NUMBER:: 739683
SPECIMEN QUALITY:: ADEQUATE
Trichomonas vaginosis: NOT DETECTED

## 2019-06-20 LAB — C. TRACHOMATIS/N. GONORRHOEAE RNA
C. trachomatis RNA, TMA: NOT DETECTED
N. gonorrhoeae RNA, TMA: NOT DETECTED

## 2019-07-02 ENCOUNTER — Ambulatory Visit (INDEPENDENT_AMBULATORY_CARE_PROVIDER_SITE_OTHER): Payer: BC Managed Care – PPO | Admitting: Psychology

## 2019-07-02 DIAGNOSIS — F411 Generalized anxiety disorder: Secondary | ICD-10-CM

## 2019-07-18 ENCOUNTER — Ambulatory Visit (INDEPENDENT_AMBULATORY_CARE_PROVIDER_SITE_OTHER): Payer: BC Managed Care – PPO | Admitting: Psychology

## 2019-07-18 DIAGNOSIS — F411 Generalized anxiety disorder: Secondary | ICD-10-CM

## 2019-08-01 ENCOUNTER — Ambulatory Visit (INDEPENDENT_AMBULATORY_CARE_PROVIDER_SITE_OTHER): Payer: BC Managed Care – PPO | Admitting: Psychology

## 2019-08-01 DIAGNOSIS — F411 Generalized anxiety disorder: Secondary | ICD-10-CM | POA: Diagnosis not present

## 2019-08-15 ENCOUNTER — Ambulatory Visit (INDEPENDENT_AMBULATORY_CARE_PROVIDER_SITE_OTHER): Payer: BC Managed Care – PPO | Admitting: Psychology

## 2019-08-15 DIAGNOSIS — F411 Generalized anxiety disorder: Secondary | ICD-10-CM | POA: Diagnosis not present

## 2019-08-27 ENCOUNTER — Ambulatory Visit: Payer: BC Managed Care – PPO | Admitting: Psychology

## 2019-10-21 ENCOUNTER — Other Ambulatory Visit: Payer: Self-pay | Admitting: Primary Care

## 2019-10-21 DIAGNOSIS — F331 Major depressive disorder, recurrent, moderate: Secondary | ICD-10-CM

## 2019-10-21 DIAGNOSIS — F411 Generalized anxiety disorder: Secondary | ICD-10-CM

## 2019-10-24 ENCOUNTER — Encounter: Payer: Self-pay | Admitting: Internal Medicine

## 2019-10-24 ENCOUNTER — Other Ambulatory Visit: Payer: Self-pay

## 2019-10-24 ENCOUNTER — Ambulatory Visit (INDEPENDENT_AMBULATORY_CARE_PROVIDER_SITE_OTHER)
Admission: RE | Admit: 2019-10-24 | Discharge: 2019-10-24 | Disposition: A | Discharge status: Home / Self Care | Payer: BC Managed Care – PPO | Source: Ambulatory Visit | Attending: Internal Medicine | Admitting: Internal Medicine

## 2019-10-24 ENCOUNTER — Ambulatory Visit: Payer: BC Managed Care – PPO | Admitting: Internal Medicine

## 2019-10-24 VITALS — BP 108/72 | HR 67 | Temp 98.3°F | Wt 186.0 lb

## 2019-10-24 DIAGNOSIS — R0781 Pleurodynia: Secondary | ICD-10-CM

## 2019-10-24 NOTE — Progress Notes (Signed)
Subjective:    Patient ID: Katherine Ruiz, female    DOB: May 20, 1991, 28 y.o.   MRN: [id number]  HPI  Pt presents to the clinic today with c/o abdominal mass. She reports last night, she coughed and felt a pop in her left upper abdomen. She noticed a bulge in the area that is tender to touch. She denies redness, warmth or rash. She denies chest congestion, cough or SOB. She denies nausea, vomiting, constipation, diarrhea or blood in her stool. She thinks many years ago, she was told she had a hernia. CT scan from 06/2019 did not show any evidence of a hernia. She has not taken anything OTC for this.   Review of Systems      Past Medical History:  Diagnosis Date  . Asthma   . Chickenpox   . Depression   . History of UTI   . Kidney stone   . MRSA (methicillin resistant staph aureus) culture positive     Current Outpatient Medications  Medication Sig Dispense Refill  . albuterol (PROVENTIL HFA;VENTOLIN HFA) 108 (90 Base) MCG/ACT inhaler Inhale 1-2 puffs into the lungs every 6 (six) hours as needed for wheezing or shortness of breath. 1 Inhaler 0  . citalopram (CELEXA) 40 MG tablet TAKE 1 TABLET (40 MG TOTAL) BY MOUTH DAILY. FOR ANXIETY AND DEPRESSION. 90 tablet 1  . gabapentin (NEURONTIN) 300 MG capsule Take 300 mg by mouth at bedtime.     . ondansetron (ZOFRAN ODT) 4 MG disintegrating tablet Take 1 tablet (4 mg total) by mouth every 8 (eight) hours as needed for nausea or vomiting. 15 tablet 0  . Probiotic Product (PROBIOTIC DAILY PO) Take by mouth daily.    . tamsulosin (FLOMAX) 0.4 MG CAPS capsule Take 1 capsule (0.4 mg total) by mouth daily. 14 capsule 0   No current facility-administered medications for this visit.    Allergies  Allergen Reactions  . Lamotrigine Hives  . Vancomycin Swelling  . Septra [Sulfamethoxazole-Trimethoprim] Rash    Family History  Problem Relation Age of Onset  . Arthritis Father   . Hyperlipidemia Father   . Hypertension Father   . Mental  illness Father   . Diabetes Father   . Hyperlipidemia Maternal Grandmother   . Arthritis Maternal Grandmother   . Breast cancer Maternal Grandmother   . Ovarian cancer Maternal Grandmother   . Hypertension Maternal Grandmother   . Mental illness Maternal Grandmother   . Diabetes Maternal Grandmother   . Cancer Maternal Grandmother 58       Breast  . Arthritis Maternal Grandfather   . Mental illness Paternal Grandmother   . Hypertension Paternal Grandmother   . Arthritis Paternal Grandmother   . Arthritis Paternal Grandfather   . Mental illness Paternal Grandfather   . Hypertension Paternal Grandfather     Social History   Socioeconomic History  . Marital status: Married    Spouse name: Not on file  . Number of children: Not on file  . Years of education: Not on file  . Highest education level: Not on file  Occupational History  . Not on file  Tobacco Use  . Smoking status: Former Research scientist (life sciences)  . Smokeless tobacco: Never Used  Substance and Sexual Activity  . Alcohol use: No    Alcohol/week: 0.0 standard drinks  . Drug use: No  . Sexual activity: Not Currently  Other Topics Concern  . 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 Difficulty of Paying Living Expenses: Not on file  Food Insecurity:   . Worried About Programme researcher, broadcasting/film/video in the Last Year: Not on file  . Ran Out of Food in the Last Year: Not on file  Transportation Needs:   . Lack of Transportation (Medical): Not on file  . Lack of Transportation (Non-Medical): Not on file  Physical Activity:   . Days of Exercise per Week: Not on file  . Minutes of Exercise per Session: Not on file  Stress:   . Feeling of Stress : Not on file  Social Connections:   . Frequency of  Communication with Friends and Family: Not on file  . Frequency of Social Gatherings with Friends and Family: Not on file  . Attends Religious Services: Not on file  . Active Member of Clubs or Organizations: Not on file  . Attends Banker Meetings: Not on file  . Marital Status: Not on file  Intimate Partner Violence:   . Fear of Current or Ex-Partner: Not on file  . Emotionally Abused: Not on file  . Physically Abused: Not on file  . Sexually Abused: Not on file     Constitutional: Denies fever, malaise, fatigue, headache or abrupt weight changes.  Respiratory: Denies difficulty breathing, shortness of breath, cough or sputum production.   Cardiovascular: Denies chest pain, chest tightness, palpitations or swelling in the hands or feet.  Gastrointestinal: Pt reports left upper abdominal mass. Denies abdominal pain, bloating, constipation, diarrhea or blood in the stool.  Skin: Denies redness, rashes, lesions or ulcercations.   No other specific complaints in a complete review of systems (except as listed in HPI above).  Objective:   Physical Exam BP 108/72   Pulse 67   Temp 98.3 F (36.8 C) (Temporal)   Wt 186 lb (84.4 kg)   LMP 10/16/2019   SpO2 98%   BMI 34.02 kg/m   Wt Readings from Last 3 Encounters:  06/19/19 185 lb 8 oz (84.1 kg)  06/13/19 188 lb (85.3 kg)  04/05/19 193 lb (87.5 kg)    General: Appears her stated age, obese, in NAD. Skin: Warm, dry and intact. No rashes, lesions or ulcerations noted. Cardiovascular: Normal rate and rhythm.  Pulmonary/Chest: Normal effort and positive vesicular breath sounds. No respiratory distress. No wheezes, rales or ronchi noted.  Abdomen: Soft and nontender. Normal bowel sounds. No distention or masses noted.  Musculoskeletal: Pain with palpation over the left anterior ribs.  Neurological: Alert and oriented.    BMET    Component Value Date/Time   NA 137 12/14/2016 1304   NA 142 08/31/2014 0359   K 4.4  12/14/2016 1304   K 4.0 08/31/2014 0359   CL 104 12/14/2016 1304   CL 112 (H) 08/31/2014 0359   CO2 27 12/14/2016 1304   CO2 23 08/31/2014 0359   GLUCOSE 98 12/14/2016 1304   GLUCOSE 104 (H) 08/31/2014 0359   BUN 16 12/14/2016 1304   BUN 6 (L) 08/31/2014 0359   CREATININE 0.82 12/14/2016 1304   CREATININE 0.89 08/31/2014 0359   CALCIUM 9.8 12/14/2016 1304   CALCIUM 7.5 (L) 08/31/2014 0359   GFRNONAA >60 08/31/2014 0359   GFRNONAA >60 04/03/2013 0034   GFRAA >60 08/31/2014 3903  GFRAA >60 04/03/2013 0034    Lipid Panel     Component Value Date/Time   CHOL 138 02/05/2016 0821   TRIG 78.0 02/05/2016 0821   HDL 42.00 02/05/2016 0821   CHOLHDL 3 02/05/2016 0821   VLDL 15.6 02/05/2016 0821   LDLCALC 80 02/05/2016 0821    CBC    Component Value Date/Time   WBC 10.0 12/14/2016 1304   RBC 4.15 12/14/2016 1304   HGB 13.3 12/14/2016 1304   HGB 11.4 (L) 08/31/2014 0359   HCT 38.3 12/14/2016 1304   HCT 35.7 08/31/2014 0359   PLT 321.0 12/14/2016 1304   PLT 196 08/31/2014 0359   MCV 92.4 12/14/2016 1304   MCV 96 08/31/2014 0359   MCH 30.6 08/31/2014 0359   MCHC 34.8 12/14/2016 1304   RDW 12.7 12/14/2016 1304   RDW 13.0 08/31/2014 0359   LYMPHSABS 3.1 12/14/2016 1304   LYMPHSABS 1.6 08/31/2014 0359   MONOABS 0.9 12/14/2016 1304   MONOABS 0.9 08/31/2014 0359   EOSABS 0.9 (H) 12/14/2016 1304   EOSABS 0.8 (H) 08/31/2014 0359   BASOSABS 0.1 12/14/2016 1304   BASOSABS 0.0 08/31/2014 0359    Hgb A1C Lab Results  Component Value Date   HGBA1C 5.4 02/05/2016           Assessment & Plan:   Left Side Rib Pain:  Will obtain xray of left side of ribs Encouraged Ibuprofen and ice for now  Will follow up after xray results are back  Nicki Reaperegina Chrissa Meetze, NP This visit occurred during the SARS-CoV-2 public health emergency.  Safety protocols were in place, including screening questions prior to the visit, additional usage of staff PPE, and extensive cleaning of exam room  while observing appropriate contact time as indicated for disinfecting solutions.

## 2019-10-26 ENCOUNTER — Encounter: Payer: Self-pay | Admitting: Internal Medicine

## 2019-10-26 NOTE — Patient Instructions (Signed)
Rib Contusion A rib contusion is a deep bruise on your rib area. Contusions are the result of a blunt trauma that causes bleeding and injury to the tissues under the skin. A rib contusion may involve bruising of the ribs and of the skin and muscles in the area. The skin over the contusion may turn blue, purple, or yellow. Minor injuries will give you a painless contusion. More severe contusions may stay painful and swollen for a few weeks. What are the causes? This condition is usually caused by a blow, trauma, or direct force to an area of the body. This often occurs while playing contact sports. What are the signs or symptoms? Symptoms of this condition include:  Swelling and redness of the injured area.  Discoloration of the injured area.  Tenderness and soreness of the injured area.  Pain with or without movement. How is this diagnosed? This condition may be diagnosed based on:  Your symptoms and medical history.  A physical exam.  Imaging tests-such as an X-ray, CT scan, or MRI-to determine if there were internal injuries or broken bones (fractures). How is this treated? This condition may be treated with:  Rest. This is often the best treatment for a rib contusion.  Icing. This reduces swelling and inflammation.  Deep-breathing exercises. These may be recommended to reduce the risk for lung collapse and pneumonia.  Medicines. Over-the-counter or prescription medicines may be given to control pain.  Injection of a numbing medicine around the nerve near your injury (nerve block). Follow these instructions at home:     Medicines  Take over-the-counter and prescription medicines only as told by your health care provider.  Do not drive or use heavy machinery while taking prescription pain medicine.  If you are taking prescription pain medicine, take actions to prevent or treat constipation. Your health care provider may recommend that you: ? Drink enough fluid to keep  your urine pale yellow. ? Eat foods that are high in fiber, such as fresh fruits and vegetables, whole grains, and beans. ? Limit foods that are high in fat and processed sugars, such as fried or sweet foods. ? Take an over-the-counter or prescription medicine for constipation. Managing pain, stiffness, and swelling  If directed, put ice on the injured area: ? Put ice in a plastic bag. ? Place a towel between your skin and the bag. ? Leave the ice on for 20 minutes, 2-3 times a day.  Rest the injured area. Avoid strenuous activity and any activities or movements that cause pain. Be careful during activities and avoid bumping the injured area.  Do not lift anything that is heavier than 5 lb (2.3 kg), or the limit that you are told, until your health care provider says that it is safe. General instructions  Do not use any products that contain nicotine or tobacco, such as cigarettes and e-cigarettes. These can delay healing. If you need help quitting, ask your health care provider.  Do deep-breathing exercises as told by your health care provider.  If you were given an incentive spirometer, use it every 1-2 hours while you are awake, or as recommended by your health care provider. This device measures how well you are filling your lungs with each breath.  Keep all follow-up visits as told by your health care provider. This is important. Contact a health care provider if you have:  Increased bruising or swelling.  Pain that is not controlled with treatment.  A fever. Get help right away if   you:  Have difficulty breathing or shortness of breath.  Develop a continual cough or you cough up thick or bloody sputum.  Feel nauseous or you vomit.  Have pain in your abdomen. Summary  A rib contusion is a deep bruise on your rib area. Contusions are the result of a blunt trauma that causes bleeding and injury to the tissues under the skin.  The skin overlying the contusion may turn  blue, purple, or yellow. Minor injuries may give you a painless contusion. More severe contusions may stay painful and swollen for a few weeks.  Rest the injured area. Avoid strenuous activity and any activities or movements that cause pain. This information is not intended to replace advice given to you by your health care provider. Make sure you discuss any questions you have with your health care provider. Document Released: 07/26/2001 Document Revised: 11/29/2017 Document Reviewed: 11/29/2017 Elsevier Patient Education  2020 [employer].  

## 2019-11-19 IMAGING — CR DG CERVICAL SPINE COMPLETE 4+V
6 series · 6 of 6 positions shown · non-contrast
Comparison: None.

CLINICAL DATA: Chronic neck pain with right arm numbness. No known
injury.

EXAM:
CERVICAL SPINE - COMPLETE 4+ VIEW

[w cervical spine lat]
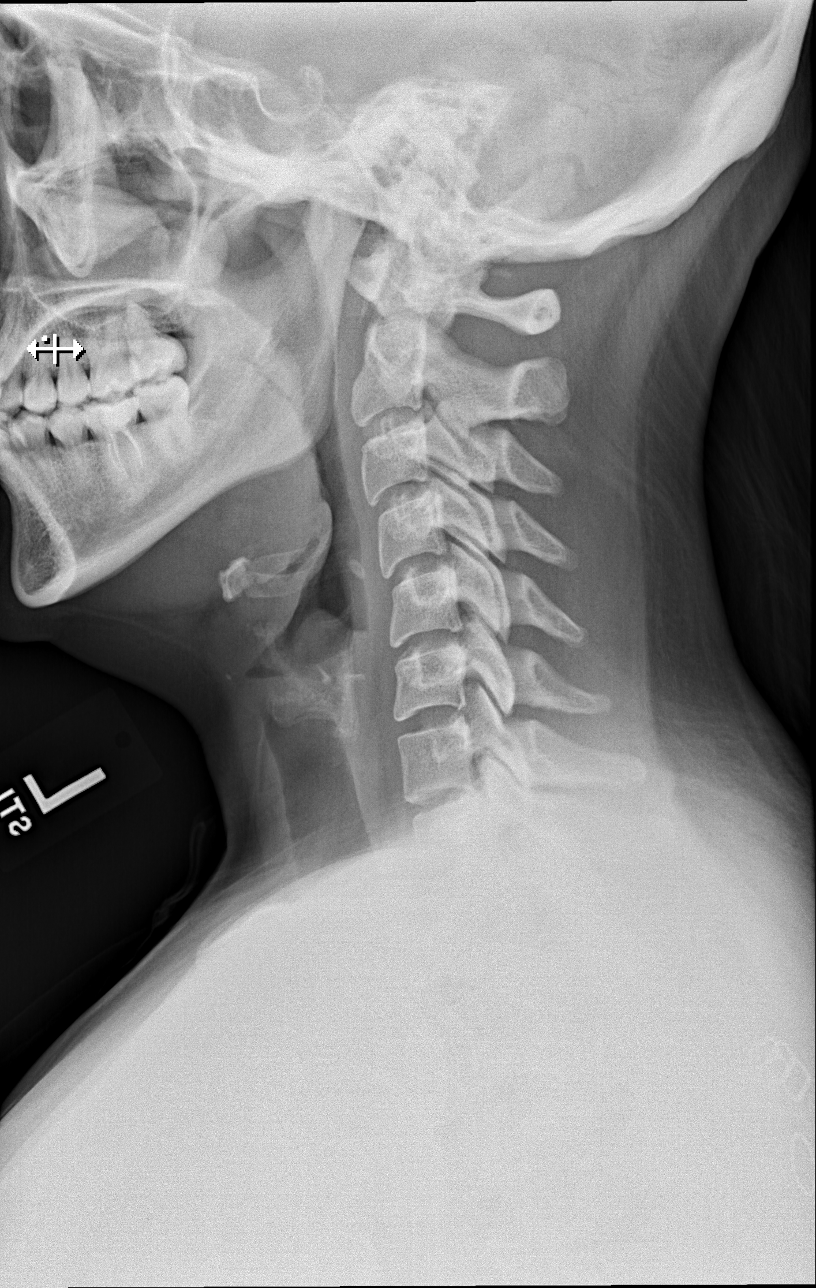

[w cervical spine ap_obl (1 of 2)]
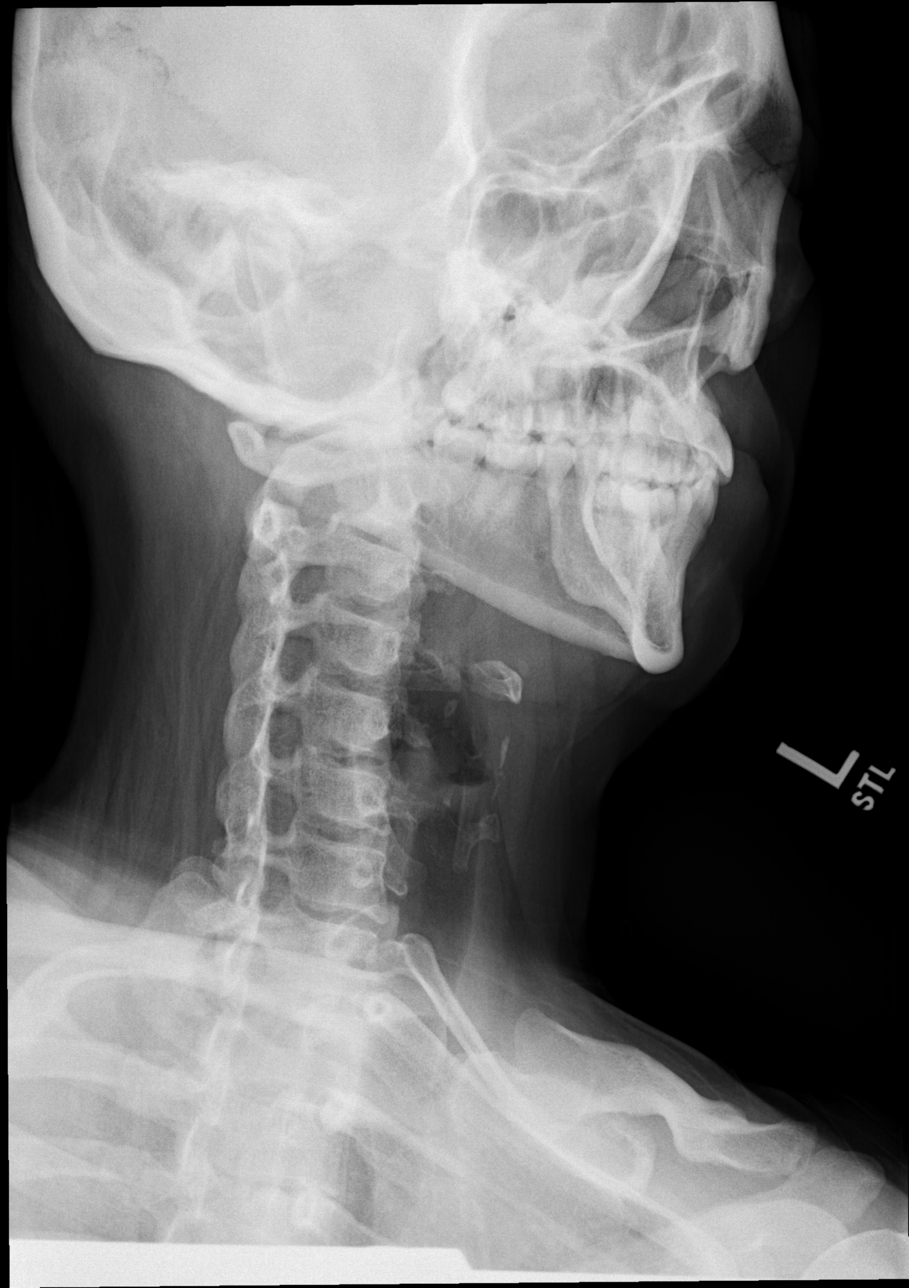

[w cervical spine ap_obl (2 of 2)]
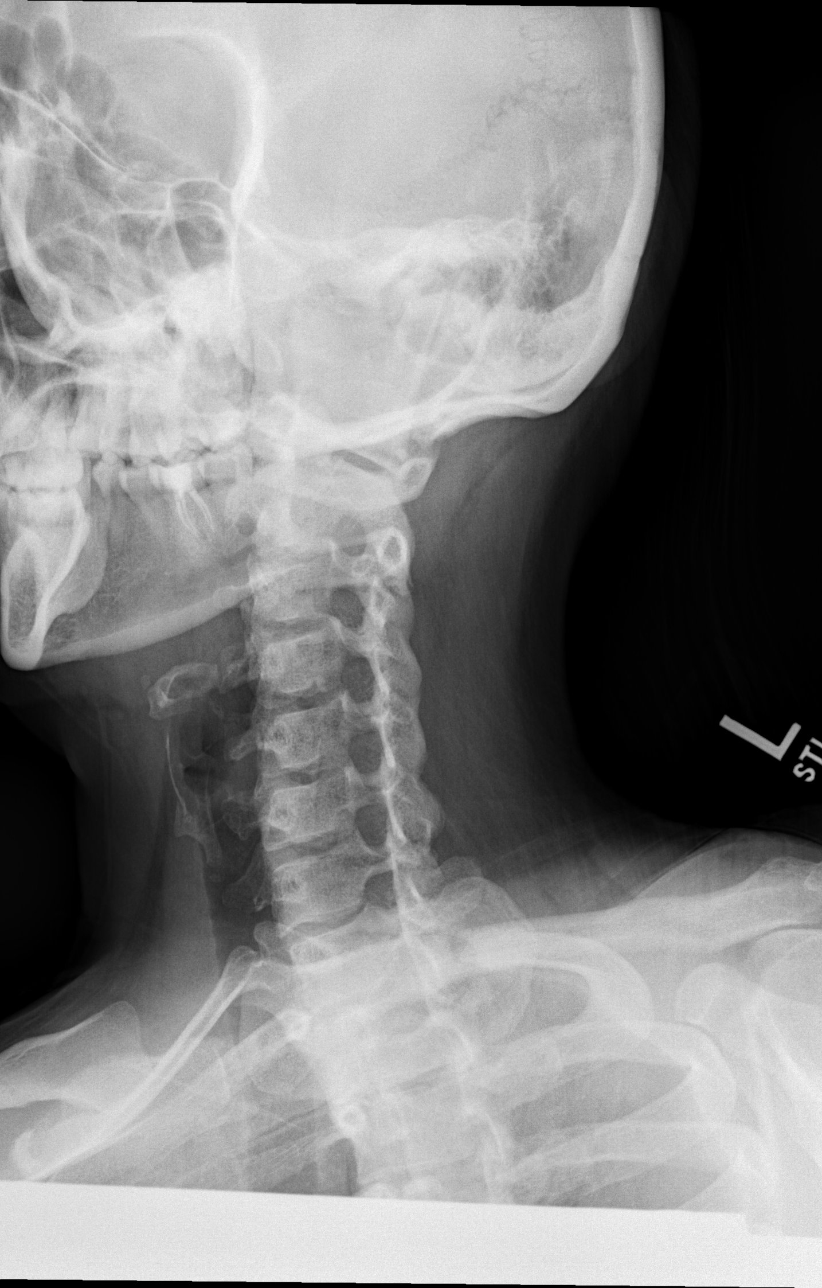

[w cervical spine ap]
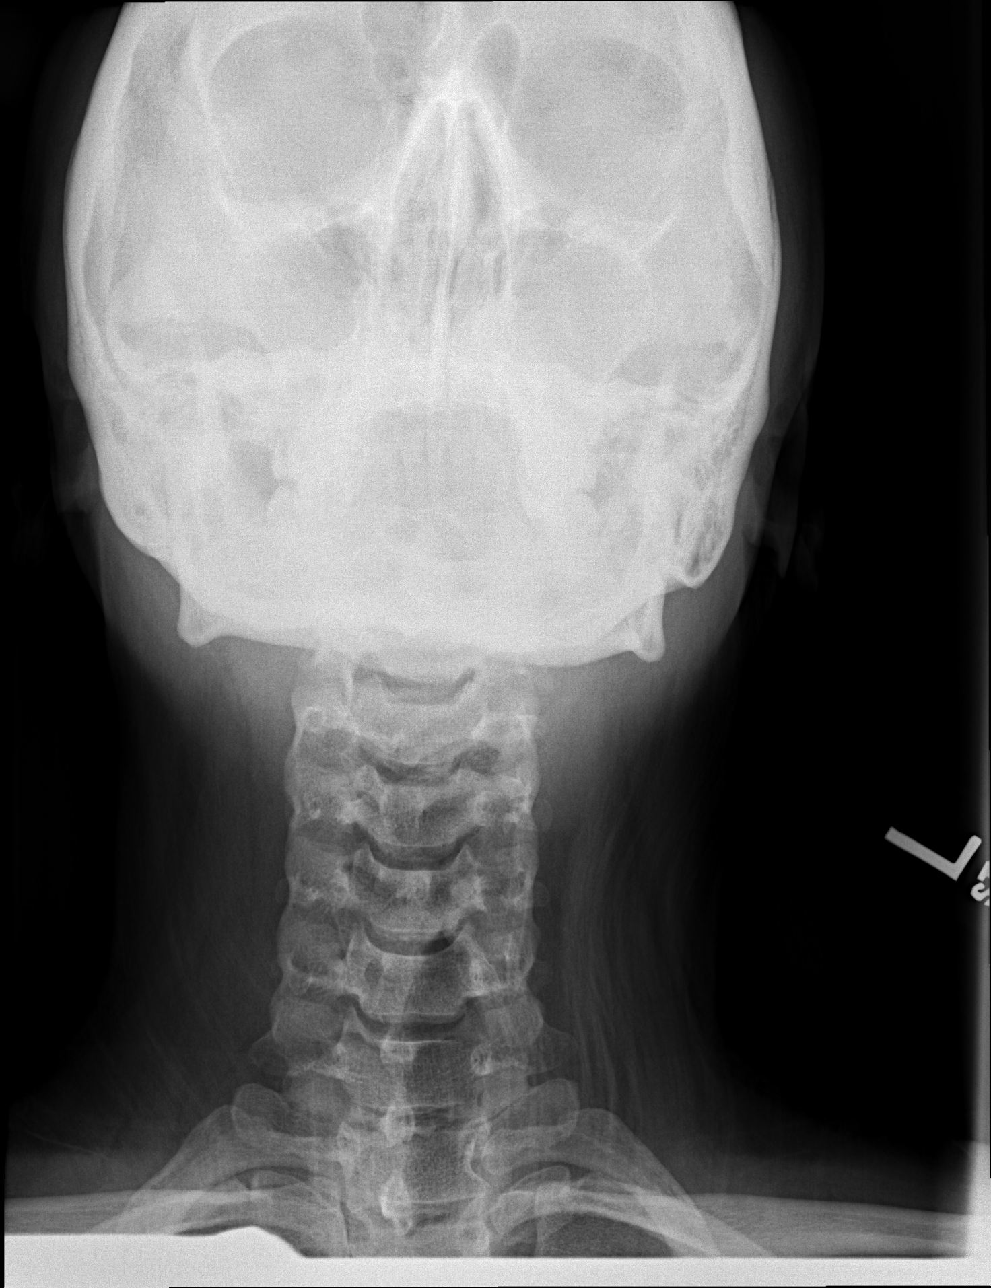

[w cervical spine odontoid (1 of 2)]
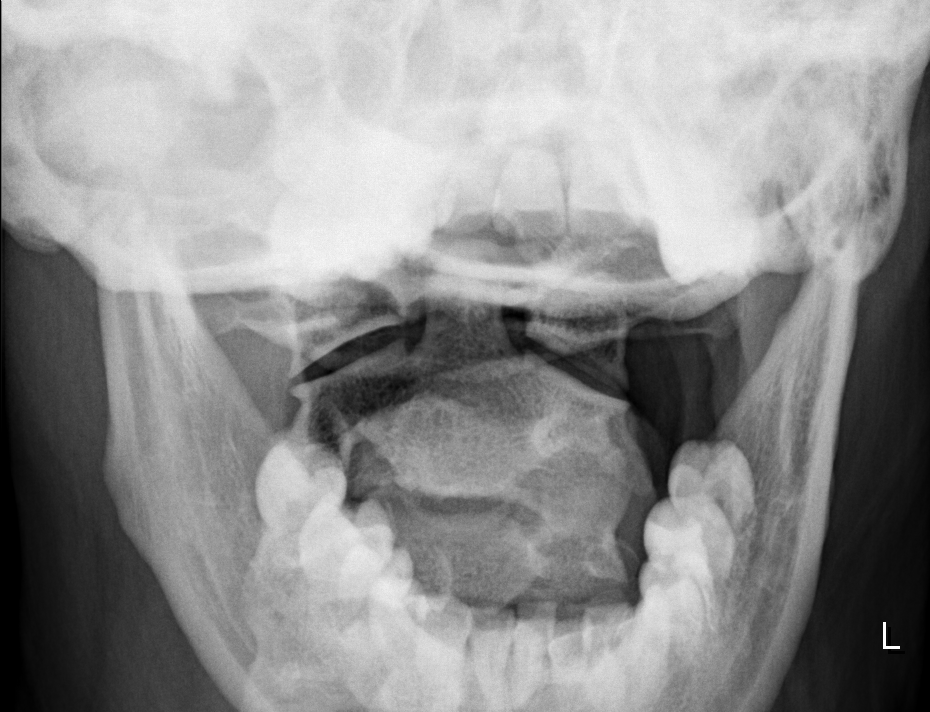

[w cervical spine odontoid (2 of 2)]
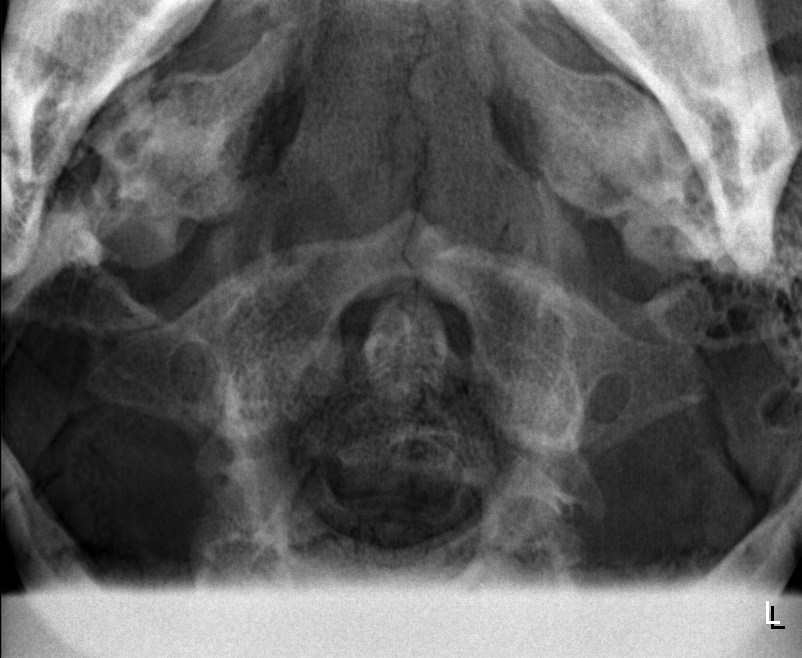

[6 of 6 positions shown; findings below may reference images not displayed]

FINDINGS: There is no evidence of cervical spine fracture or prevertebral soft
tissue swelling. Alignment is normal. No other significant bone
abnormalities are identified. No neural foraminal stenosis is noted.
IMPRESSION: Negative cervical spine radiographs.

## 2019-11-25 ENCOUNTER — Other Ambulatory Visit: Payer: Self-pay

## 2019-11-25 DIAGNOSIS — Z3041 Encounter for surveillance of contraceptive pills: Secondary | ICD-10-CM

## 2019-11-25 MED ORDER — BLISOVI 24 FE 1-20 MG-MCG(24) PO TABS: 1.0000 | ORAL_TABLET | Freq: Every day | ORAL | 0 refills | Status: DC

## 2019-11-25 NOTE — Telephone Encounter (Signed)
Spoken and notified patient of [name]'s comments. Patient verbalized understanding.  

## 2019-11-25 NOTE — Telephone Encounter (Signed)
Patient contacted the office and states that she is due for a refill on her Blisovi birth control. Patient states that Jae Dire originally prescribed her birth control until she started following with [facility].  Patient states that she was scheduled to follow up with her provider at [facility] today, but she was exposed to COVID over the weekend and they will not see her over a virtual visit, and they will not refill her birth control.  Patient states she really needs a refill, and is willing to do a virtual with Jae Dire anytime if she can do a one time refill of this for her? Please advise?

## 2019-11-25 NOTE — Telephone Encounter (Signed)
Refill sent to [facility]. No need for virtual visit.

## 2019-11-26 ENCOUNTER — Ambulatory Visit: Payer: BC Managed Care – PPO | Attending: Internal Medicine

## 2019-11-26 DIAGNOSIS — Z20822 Contact with and (suspected) exposure to covid-19: Secondary | ICD-10-CM

## 2019-11-28 LAB — NOVEL CORONAVIRUS, NAA: SARS-CoV-2, NAA: NOT DETECTED

## 2019-12-18 ENCOUNTER — Emergency Department: Payer: BC Managed Care – PPO

## 2019-12-18 ENCOUNTER — Other Ambulatory Visit: Payer: Self-pay

## 2019-12-18 ENCOUNTER — Emergency Department
Admission: EM | Admit: 2019-12-18 | Discharge: 2019-12-18 | Disposition: A | Discharge status: Home / Self Care | Payer: BC Managed Care – PPO | Attending: Emergency Medicine | Admitting: Emergency Medicine

## 2019-12-18 ENCOUNTER — Telehealth: Payer: Self-pay

## 2019-12-18 ENCOUNTER — Encounter: Payer: Self-pay | Admitting: Emergency Medicine

## 2019-12-18 DIAGNOSIS — Z87891 Personal history of nicotine dependence: Secondary | ICD-10-CM | POA: Diagnosis not present

## 2019-12-18 DIAGNOSIS — J45909 Unspecified asthma, uncomplicated: Secondary | ICD-10-CM | POA: Insufficient documentation

## 2019-12-18 DIAGNOSIS — O209 Hemorrhage in early pregnancy, unspecified: Secondary | ICD-10-CM

## 2019-12-18 DIAGNOSIS — O200 Threatened abortion: Secondary | ICD-10-CM | POA: Insufficient documentation

## 2019-12-18 LAB — TYPE AND SCREEN
ABO/RH(D): A POS
Antibody Screen: NEGATIVE

## 2019-12-18 LAB — CBC
HCT: 39.1 % (ref 36.0–46.0)
Hemoglobin: 13.3 g/dL (ref 12.0–15.0)
MCH: 32.5 pg (ref 26.0–34.0)
MCHC: 34 g/dL (ref 30.0–36.0)
MCV: 95.6 fL (ref 80.0–100.0)
Platelets: 295 10*3/uL (ref 150–400)
RBC: 4.09 MIL/uL (ref 3.87–5.11)
RDW: 12 % (ref 11.5–15.5)
WBC: 8.7 10*3/uL (ref 4.0–10.5)
nRBC: 0 % (ref 0.0–0.2)

## 2019-12-18 LAB — HCG, QUANTITATIVE, PREGNANCY: hCG, Beta Chain, Quant, S: 1957 m[IU]/mL — ABNORMAL HIGH (ref <5)

## 2019-12-18 LAB — POCT PREGNANCY, URINE: Preg Test, Ur: POSITIVE — AB

## 2019-12-18 MED ORDER — ACETAMINOPHEN 500 MG PO TABS
1000.0000 mg | ORAL_TABLET | Freq: Once | ORAL | Status: AC
  Administered 2019-12-18: 18:00:00 1000 mg via ORAL
  Filled 2019-12-18: qty 2

## 2019-12-18 NOTE — Telephone Encounter (Signed)
Received fax from after hours nurse line that reports pt c/o heavy bleeding with her menses, which has never been this bad. Pt states she has severe cramping and passing large clots. Pain is 6-7 out of 10 pain scale. States she has a tampon and pad on. Pt on birth control. No fever.  Pt was advised by nurse line to go to ED now or be triaged by PCP.  Contacted pt and she reported she is on the way to the ER. She is apprehensive due to covid about going to the ER but this was discussed and pt was appreciative.   Pt reports her menses started at noon today. She immediately felt something was wrong. She has used a tampon and as soon as she stood up to wash her hands the tampon was full. Pt also reports abdominal cramping. Pt said she normally does not have a menses or just spotting due to being on birth control. Advised pt she should follow directions earlier abd continue to the ER. Pt agreed. Advised to f/u with office after she was d/c. Pt verbalized understanding.

## 2019-12-18 NOTE — ED Provider Notes (Addendum)
[facility] Emergency Department Provider Note       Time seen: ----------------------------------------- 5:36 PM on 12/18/2019 -----------------------------------------   I have reviewed the triage vital signs and the nursing notes.  HISTORY   Chief Complaint Vaginal Bleeding    HPI Katherine Ruiz is a 29 y.o. female with a history of asthma, depression, kidney stones who presents to the ED for heavy vaginal bleeding that started around noon today.  Normally she does not have a menstrual cycle, reports soaking through 3 tampons of 4 pads since noon today.  She does not think she could be pregnant.  Past Medical History:  Diagnosis Date  . Asthma   . Chickenpox   . Depression   . History of UTI   . Kidney stone   . MRSA (methicillin resistant staph aureus) culture positive     Patient Active Problem List   Diagnosis Date Noted  . GAD (generalized anxiety disorder) 12/19/2018  . Cervical radiculopathy 09/01/2018  . Asthma 08/15/2018  . Sciatica of right side 08/15/2018  . Major depressive disorder 11/20/2015    Past Surgical History:  Procedure Laterality Date  . FOOT SURGERY Left 2014    Allergies Lamotrigine, Vancomycin, and Septra [sulfamethoxazole-trimethoprim]  Social History Social History   Tobacco Use  . Smoking status: Former Games developer  . Smokeless tobacco: Never Used  Substance Use Topics  . Alcohol use: No    Alcohol/week: 0.0 standard drinks  . Drug use: No    Review of Systems Constitutional: Negative for fever. Cardiovascular: Negative for chest pain. Respiratory: Negative for shortness of breath. Genitourinary: Positive for vaginal bleeding Gastrointestinal: Negative for abdominal pain, vomiting and diarrhea. Musculoskeletal: Negative for back pain. Skin: Negative for rash. Neurological: Negative for headaches, focal weakness or numbness.  All systems negative/normal/unremarkable except as stated in the  HPI  ____________________________________________   PHYSICAL EXAM:  VITAL SIGNS: ED Triage Vitals  Enc Vitals Group     BP 12/18/19 1607 112/68     Pulse Rate 12/18/19 1607 79     Resp 12/18/19 1607 18     Temp 12/18/19 1607 98.4 F (36.9 C)     Temp Source 12/18/19 1607 Oral     SpO2 12/18/19 1607 97 %     Weight 12/18/19 1602 175 lb (79.4 kg)     Height 12/18/19 1602 5\' 1"  (1.549 m)     Head Circumference --      Peak Flow --      Pain Score 12/18/19 1602 8     Pain Loc --      Pain Edu? --      Excl. in GC? --     Constitutional: Alert and oriented. Well appearing and in no distress. Eyes: Conjunctivae are normal. Normal extraocular movements. Cardiovascular: Normal rate, regular rhythm. No murmurs, rubs, or gallops. Respiratory: Normal respiratory effort without tachypnea nor retractions. Breath sounds are clear and equal bilaterally. No wheezes/rales/rhonchi. Gastrointestinal: Soft and nontender. Normal bowel sounds Musculoskeletal: Nontender with normal range of motion in extremities. No lower extremity tenderness nor edema. Neurologic:  Normal speech and language. No gross focal neurologic deficits are appreciated.  Skin:  Skin is warm, dry and intact. No rash noted. Psychiatric: Mood and affect are normal. Speech and behavior are normal.  ____________________________________________  ED COURSE:  As part of my medical decision making, I reviewed the following data within the electronic MEDICAL RECORD NUMBER History obtained from family if available, nursing notes, old chart and ekg, as  well as notes from prior ED visits. Patient presented for vaginal bleeding, we will assess with labs and imaging as indicated at this time.   Procedures  [name] was evaluated in Emergency Department on 12/18/2019 for the symptoms described in the history of present illness. She was evaluated in the context of the global COVID-19 pandemic, which necessitated consideration that the  patient might be at risk for infection with the SARS-CoV-2 virus that causes COVID-19. Institutional protocols and algorithms that pertain to the evaluation of patients at risk for COVID-19 are in a state of rapid change based on information released by regulatory bodies including the CDC and federal and state organizations. These policies and algorithms were followed during the patient's care in the ED.  ____________________________________________   LABS (pertinent positives/negatives)  Labs Reviewed  HCG, QUANTITATIVE, PREGNANCY - Abnormal; Notable for the following components:      Result Value   hCG, Beta Chain, Quant, S 1,957 (*)    All other components within normal limits  POCT PREGNANCY, URINE - Abnormal; Notable for the following components:   Preg Test, Ur POSITIVE (*)    All other components within normal limits  CBC  POC URINE PREG, ED  TYPE AND SCREEN    RADIOLOGY  Pelvic ultrasound IMPRESSION: No gestational sac, yolk sac, fetal pole, or cardiac activity yet visualized. Recommend follow-up quantitative B-HCG levels and follow-up US in 14 days to assess intrauterine pregnancy. This recommendation follows SRU consensus guidelines: Diagnostic Criteria for Nonviable Pregnancy Early in the First Trimester. [place] Med 2013; 389:3734-28.   ____________________________________________   DIFFERENTIAL DIAGNOSIS   Threatened miscarriage, miscarriage, dysmenorrhea, anemia  FINAL ASSESSMENT AND PLAN  Threatened miscarriage   Plan: The patient had presented for vaginal bleeding and discovered she was pregnant here.  hCG was 1957.  This will need to be rechecked in 2 days, although with her heavy bleeding I suspect certainly she is having a miscarriage. Patient's imaging did not reveal any gestational sac, yolk sac, fetal pole or cardiac activity.  She is at the upper limits of the discriminatory zone I discussed this with Dr. Kenton Kingfisher.  Abdomen is soft at this time and  bleeding is not severe.  Will advise conservative treatment, follow-up in 2 days for recheck.   Laurence Aly, MD    Note: This note was generated in part or whole with voice recognition software. Voice recognition is usually quite accurate but there are transcription errors that can and very often do occur. I apologize for any typographical errors that were not detected and corrected.     Earleen Newport, MD 12/18/19 1738    Earleen Newport, MD 12/18/19 [phone]

## 2019-12-18 NOTE — Telephone Encounter (Signed)
Noted. This was appropriate given positive pregnancy, sounds like she may be having a miscarriage.

## 2019-12-18 NOTE — ED Notes (Signed)
Lab notified to add on Hcg.

## 2019-12-18 NOTE — ED Triage Notes (Signed)
Pt reports starting with heavy vaginal bleeding starting at noon. Normally does not have a period.   Reports soaking 3 tampons and 4 pads since noon today.  Has passed clots the size of golf balls. Is having pelvic pain.

## 2019-12-19 ENCOUNTER — Telehealth: Payer: Self-pay | Admitting: Obstetrics & Gynecology

## 2019-12-19 NOTE — Telephone Encounter (Signed)
scheduled

## 2019-12-19 NOTE — Telephone Encounter (Signed)
Per [facility] Patient to be schedule for Beta on Friday, 12/20/19 and follow up with MD only on Monday, 12/23/19 . Called and left voicemail for patient to call back to be schedule .

## 2019-12-20 ENCOUNTER — Other Ambulatory Visit: Payer: Self-pay | Admitting: Obstetrics and Gynecology

## 2019-12-20 ENCOUNTER — Other Ambulatory Visit: Payer: BC Managed Care – PPO

## 2019-12-20 ENCOUNTER — Other Ambulatory Visit: Payer: Self-pay

## 2019-12-20 DIAGNOSIS — O3680X0 Pregnancy with inconclusive fetal viability, not applicable or unspecified: Secondary | ICD-10-CM

## 2019-12-21 LAB — BETA HCG QUANT (REF LAB): hCG Quant: 239 m[IU]/mL

## 2019-12-23 ENCOUNTER — Ambulatory Visit (INDEPENDENT_AMBULATORY_CARE_PROVIDER_SITE_OTHER): Payer: BC Managed Care – PPO | Admitting: Obstetrics & Gynecology

## 2019-12-23 ENCOUNTER — Encounter: Payer: Self-pay | Admitting: Obstetrics & Gynecology

## 2019-12-23 ENCOUNTER — Other Ambulatory Visit: Payer: Self-pay

## 2019-12-23 VITALS — BP 120/70 | Ht 61.0 in | Wt 185.0 lb

## 2019-12-23 DIAGNOSIS — O039 Complete or unspecified spontaneous abortion without complication: Secondary | ICD-10-CM | POA: Diagnosis not present

## 2019-12-23 NOTE — Progress Notes (Signed)
Obstetric Problem Visit   Chief Complaint: First trimester bleeding  History of Present Illness: Patient is a 29 y.o. [id number]  presenting for first trimester bleeding.  The onset of bleeding was last week, she had been on the birth control pill without missing a dose and no other new meds or changes to prompt break thru ovulation, yet started bleeding off cycle and noted to have elevated beta hCG in ER last week (1957).  She has had intermittent bleeding since that time including today.  She had second beta hCG level that was 48 hours later that was 239.  [place] in ER on initial presentation was neg for IUP or other anomaly.  Is bleeding equal to or greater than normal menstrual flow:  Yes Any recent trauma:  No Recent intercourse:  No History of prior miscarriage:  No Prior ultrasound demonstrating IUP:  No Prior ultrasound demonstrating viable IUP:  No Prior Serum HCG:  Yes Rh status: A+  PMHx: She  has a past medical history of Asthma, Chickenpox, Depression, History of UTI, Kidney stone, and MRSA (methicillin resistant staph aureus) culture positive. Also,  has a past surgical history that includes Foot surgery (Left, 2014)., family history includes Arthritis in her father, maternal grandfather, maternal grandmother, paternal grandfather, and paternal grandmother; Breast cancer in her maternal grandmother; Cancer (age of onset: 108) in her maternal grandmother; Diabetes in her father and maternal grandmother; Hyperlipidemia in her father and maternal grandmother; Hypertension in her father, maternal grandmother, paternal grandfather, and paternal grandmother; Mental illness in her father, maternal grandmother, paternal grandfather, and paternal grandmother; Ovarian cancer in her maternal grandmother.,  reports that she has quit smoking. She has never used smokeless tobacco. She reports that she does not drink alcohol or use drugs.  She has a current medication list which includes the following  prescription(s): albuterol, vitamin c, citalopram, blisovi 24 fe, and probiotic product. Also, is allergic to lamotrigine; vancomycin; and septra [sulfamethoxazole-trimethoprim].  Review of Systems  Constitutional: Negative for chills, fever and malaise/fatigue.  HENT: Negative for congestion, sinus pain and sore throat.   Eyes: Negative for blurred vision and pain.  Respiratory: Negative for cough and wheezing.   Cardiovascular: Negative for chest pain and leg swelling.  Gastrointestinal: Negative for abdominal pain, constipation, diarrhea, heartburn, nausea and vomiting.  Genitourinary: Negative for dysuria, frequency, hematuria and urgency.  Musculoskeletal: Negative for back pain, joint pain, myalgias and neck pain.  Skin: Negative for itching and rash.  Neurological: Negative for dizziness, tremors and weakness.  Endo/Heme/Allergies: Does not bruise/bleed easily.  Psychiatric/Behavioral: Negative for depression. The patient is not nervous/anxious and does not have insomnia.     Objective: Vitals:   12/23/19 0819  BP: 120/70   Physical Exam Constitutional:      General: She is not in acute distress.    Appearance: She is well-developed.  Musculoskeletal:        General: Normal range of motion.  Neurological:     Mental Status: She is alert and oriented to person, place, and time.  Skin:    General: Skin is warm and dry.  Vitals reviewed.    Assessment: 29 y.o. [id number] with new diagnosis and problem for patient of Complete abortion  Plan: 1) First trimester bleeding - incidence and clinical course of first trimester bleeding is discussed in detail with the patient today.  Approximately 1/3 of pregnancies ending in live births experienced 1st trimester bleeding.  The amount of bleeding is variable and not necessarily predictive of outcome.  Sources may be cervical or uterine.  Subchorionic hemorrhages are a frequent concurrent findings on ultrasound and are followed  expectantly.  These often absorb or regress spontaneously although risk for expansion and further disruption of the utero-placental interface leading to miscarriage is possible.  There is no clearly documented benefit to limiting or modifying activity and sexual intercourse in altering clinic course of 1st trimester bleeding.            Condolences were offered to the patient and her family.  I stressed that while emotionally difficult, that this did not occur because of an actions or inactions by the patient.  Somewhere between 10-20% of identified first trimester pregnancies will unfortunately end in miscarriage.  Given this relatively high incidence rate, further diagnostic testing such as chromosome analysis is generally not clinically relevant nor recommended.  Although the chromosomal abnormalities have been implicated at rates as high as 70% in some studies, these are generally random and do not infer and increased risk of recurrence with subsequent pregnancies.  However, 3 or more consecutive first trimester losses are relatively uncommon, and these patient generally do benefit from additional work up to determine a potential modifiable etiology.              We briefly discussed management options including expectant management, medical management, and surgical management as well as their relative success rates and complications. Approximately 80% of first trimester miscarriages will pass successfully but may require a time frame of up to 8 weeks (ACOG Practice Bulletin 150 May 2015 "Early Pregnancy Loss").  Medical management has literature supporting its use up to 63 days or [redacted]w[redacted]d gestation and results in a passage rate of 84-85% Environmental education officer Bulletin 143 March 2014 "Medical Management of First-Trimester Abortion").  Dilation and curettage has the highest rate of uterine evacuation, but carries with is operative cost, surgical and anesthetic risk.  While these risk are relatively small they  nevertheless include infection, bleeding, uterine perforation, formation of uterine synechia, and in rare cases death.              We discussed repeat ultrasound and or trending HCG levels if the patient wishes to pursue these prior to making her decision.  Clinically I am confident of the diagnosis, but I do not want any doubts in the patient's mind regarding the plan of management she chooses to adopt. The patient elects to proceed with surgical management for her missed abortion. I have discussed with the patient the indications for the procedure. Included in the discussion were the options of therapy, as wall as their individual risks, benefits, and complications. Ample time was given to answer all questions.   While the incidence is low, the risks from this surgery include, but are not limited to, the risks of anesthesia, hemorrhage, infection, perforation, and injury to adjacent structures including bowel, bladder and blood vessels.   2) The patient is Rh POS, rhogam is therefore not indicated to decrease the risk rhesus alloimmunization.    3) Routine bleeding precautions were discussed with the patient prior the conclusion of today's visit.  4) Plans to restart OCP.  Counseled as to all other options for [facility], esp as this was a pill failure conception.  A total of 30 minutes were spent face-to-face with the patient as well as preparation, review, communication, and documentation during this encounter.   Annamarie Major, MD, [facility], [facility] 12/23/2019  8:45 AM

## 2020-02-11 ENCOUNTER — Other Ambulatory Visit: Payer: Self-pay | Admitting: Primary Care

## 2020-02-11 DIAGNOSIS — Z3041 Encounter for surveillance of contraceptive pills: Secondary | ICD-10-CM

## 2020-02-12 NOTE — Telephone Encounter (Signed)
Spoken to patient and will refused as request. She will see OB/GYN on 02/20/2020

## 2020-02-20 ENCOUNTER — Other Ambulatory Visit: Payer: Self-pay

## 2020-02-20 ENCOUNTER — Other Ambulatory Visit (HOSPITAL_COMMUNITY)
Admission: RE | Admit: 2020-02-20 | Discharge: 2020-02-20 | Disposition: A | Discharge status: Home / Self Care | Payer: BC Managed Care – PPO | Source: Ambulatory Visit | Attending: Obstetrics & Gynecology | Admitting: Obstetrics & Gynecology

## 2020-02-20 ENCOUNTER — Ambulatory Visit (INDEPENDENT_AMBULATORY_CARE_PROVIDER_SITE_OTHER): Payer: BC Managed Care – PPO | Admitting: Obstetrics & Gynecology

## 2020-02-20 ENCOUNTER — Encounter: Payer: Self-pay | Admitting: Obstetrics & Gynecology

## 2020-02-20 VITALS — BP 120/80 | Ht 61.0 in | Wt 186.0 lb

## 2020-02-20 DIAGNOSIS — Z124 Encounter for screening for malignant neoplasm of cervix: Secondary | ICD-10-CM

## 2020-02-20 DIAGNOSIS — Z01419 Encounter for gynecological examination (general) (routine) without abnormal findings: Secondary | ICD-10-CM

## 2020-02-20 DIAGNOSIS — Z803 Family history of malignant neoplasm of breast: Secondary | ICD-10-CM | POA: Diagnosis not present

## 2020-02-20 DIAGNOSIS — Z3044 Encounter for surveillance of vaginal ring hormonal contraceptive device: Secondary | ICD-10-CM | POA: Diagnosis not present

## 2020-02-20 MED ORDER — ETONOGESTREL-ETHINYL ESTRADIOL 0.12-0.015 MG/24HR VA RING: VAGINAL_RING | VAGINAL | 3 refills | Status: DC

## 2020-02-20 NOTE — Progress Notes (Signed)
HPI:      [name] is a 29 y.o. [id number] who LMP was No LMP recorded. (Menstrual status: Oral contraceptives)., she presents today for her annual examination. The patient has no complaints today. The patient is sexually active. Her last pap: approximate date 2018 and was normal. The patient does perform self breast exams.  There is notable family history of breast or ovarian cancer in her family.  The patient has regular exercise: yes.  The patient denies current symptoms of depression.    GYN History: Contraception: OCP (estrogen/progesterone)  PMHx: Past Medical History:  Diagnosis Date  . Asthma   . Chickenpox   . Depression   . History of UTI   . Kidney stone   . MRSA (methicillin resistant staph aureus) culture positive    Past Surgical History:  Procedure Laterality Date  . FOOT SURGERY Left 2014   Family History  Problem Relation Age of Onset  . Arthritis Father   . Hyperlipidemia Father   . Hypertension Father   . Mental illness Father   . Diabetes Father   . Hyperlipidemia Maternal Grandmother   . Arthritis Maternal Grandmother   . Breast cancer Maternal Grandmother   . Ovarian cancer Maternal Grandmother   . Hypertension Maternal Grandmother   . Mental illness Maternal Grandmother   . Diabetes Maternal Grandmother   . Cancer Maternal Grandmother 40       Breast  . Arthritis Maternal Grandfather   . Mental illness Paternal Grandmother   . Hypertension Paternal Grandmother   . Arthritis Paternal Grandmother   . Arthritis Paternal Grandfather   . Mental illness Paternal Grandfather   . Hypertension Paternal Grandfather    Social History   Tobacco Use  . Smoking status: Former Research scientist (life sciences)  . Smokeless tobacco: Never Used  Substance Use Topics  . Alcohol use: No    Alcohol/week: 0.0 standard drinks  . Drug use: No    Current Outpatient Medications:  .  citalopram (CELEXA) 40 MG tablet, TAKE 1 TABLET (40 MG TOTAL) BY MOUTH DAILY. FOR ANXIETY AND  DEPRESSION., Disp: 90 tablet, Rfl: 1 .  Ascorbic Acid (VITAMIN C) 1000 MG tablet, Take 1,000 mg by mouth daily., Disp: , Rfl:  .  etonogestrel-ethinyl estradiol (NUVARING) 0.12-0.015 MG/24HR vaginal ring, Insert vaginally and leave in place for 3 consecutive weeks (or for 24 days), then remove for 1 week (or for 4 days)., Disp: 3 each, Rfl: 3 .  Probiotic Product (PROBIOTIC DAILY PO), Take by mouth daily., Disp: , Rfl:  Allergies: Lamotrigine, Vancomycin, and Septra [sulfamethoxazole-trimethoprim]  Review of Systems  Constitutional: Negative for chills, fever and malaise/fatigue.  HENT: Negative for congestion, sinus pain and sore throat.   Eyes: Negative for blurred vision and pain.  Respiratory: Negative for cough and wheezing.   Cardiovascular: Negative for chest pain and leg swelling.  Gastrointestinal: Negative for abdominal pain, constipation, diarrhea, heartburn, nausea and vomiting.  Genitourinary: Negative for dysuria, frequency, hematuria and urgency.  Musculoskeletal: Negative for back pain, joint pain, myalgias and neck pain.  Skin: Negative for itching and rash.  Neurological: Negative for dizziness, tremors and weakness.  Endo/Heme/Allergies: Does not bruise/bleed easily.  Psychiatric/Behavioral: Negative for depression. The patient is not nervous/anxious and does not have insomnia.     Objective: BP 120/80   Ht 5\' 1"  (1.549 m)   Wt 186 lb (84.4 kg)   BMI 35.14 kg/m   Filed Weights   02/20/20 1428  Weight: 186 lb (84.4 kg)  Body mass index is 35.14 kg/m. Physical Exam Constitutional:      General: She is not in acute distress.    Appearance: She is well-developed.  Genitourinary:     Pelvic exam was performed with patient supine.     Vagina, uterus and rectum normal.     No lesions in the vagina.     No vaginal bleeding.     No cervical motion tenderness, friability, lesion or polyp.     Uterus is mobile.     Uterus is not enlarged.     No uterine mass  detected.    Uterus is midaxial.     No right or left adnexal mass present.     Right adnexa not tender.     Left adnexa not tender.  HENT:     Head: Normocephalic and atraumatic. No laceration.     Right Ear: Hearing normal.     Left Ear: Hearing normal.     Mouth/Throat:     Pharynx: Uvula midline.  Eyes:     Pupils: Pupils are equal, round, and reactive to light.  Neck:     Thyroid: No thyromegaly.  Cardiovascular:     Rate and Rhythm: Normal rate and regular rhythm.     Heart sounds: No murmur. No friction rub. No gallop.   Pulmonary:     Effort: Pulmonary effort is normal. No respiratory distress.     Breath sounds: Normal breath sounds. No wheezing.  Chest:     Breasts:        Right: No mass, skin change or tenderness.        Left: No mass, skin change or tenderness.  Abdominal:     General: Bowel sounds are normal. There is no distension.     Palpations: Abdomen is soft.     Tenderness: There is no abdominal tenderness. There is no rebound.  Musculoskeletal:        General: Normal range of motion.     Cervical back: Normal range of motion and neck supple.  Neurological:     Mental Status: She is alert and oriented to person, place, and time.     Cranial Nerves: No cranial nerve deficit.  Skin:    General: Skin is warm and dry.  Psychiatric:        Judgment: Judgment normal.  Vitals reviewed.     Assessment:  ANNUAL EXAM 1. Women's annual routine gynecological examination   2. Screening for cervical cancer   3. Encounter for surveillance of vaginal ring hormonal contraceptive device      Screening Plan:            1.  Cervical Screening-  Pap smear done today H/o HPV years ago  2.  Labs managed by PCP  3.  Counseling for contraception: NuvaRing vaginal inserts   Encounter for surveillance of vaginal ring hormonal contraceptive device - etonogestrel-ethinyl estradiol (NUVARING) 0.12-0.015 MG/24HR vaginal ring; Insert vaginally and leave in place for 3  consecutive weeks (or for 24 days), then remove for 1 week (or for 4 days).  Dispense: 3 each; Refill: 3 - Info provided to patient as to the risks, side effects, and alternatives    F/U  Return in about 1 year (around 02/19/2021) for Annual.  Katherine Major, MD, [facility], [facility] 02/20/2020  2:56 PM

## 2020-02-20 NOTE — Patient Instructions (Signed)
Ethinyl Estradiol; Etonogestrel vaginal ring What is this medicine? ETHINYL ESTRADIOL; ETONOGESTREL (ETH in il es tra DYE ole; et oh noe JES trel) vaginal ring is a flexible, vaginal ring used as a contraceptive (birth control method). This medicine combines 2 types of female hormones, an estrogen and a progestin. This ring is used to prevent ovulation and pregnancy. Each ring is effective for 1 month. This medicine may be used for other purposes; ask your health care provider or pharmacist if you have questions. COMMON BRAND NAME(S): EluRyng, NuvaRing What should I tell my health care provider before I take this medicine? They need to know if you have any of these conditions:  abnormal vaginal bleeding  blood vessel disease or blood clots  breast, cervical, endometrial, ovarian, liver, or uterine cancer  diabetes  gallbladder disease  having surgery  heart disease or recent heart attack  high blood pressure  high cholesterol or triglycerides  history of irregular heartbeat or heart valve problems  kidney disease  liver disease  migraine headaches  protein C deficiency  protein S deficiency  recently had a baby, miscarriage, or abortion  stroke  systemic lupus erythematosus (SLE)  tobacco smoker  your age is more than 31 years old  an unusual or allergic reaction to estrogens, progestins, other medicines, foods, dyes, or preservatives  pregnant or trying to get pregnant  breast-feeding How should I use this medicine? Insert the ring into your vagina as directed. Follow the directions on the prescription label. The ring will remain place for 3 weeks and is then removed for a 1-week break. A new ring is inserted 1 week after the last ring was removed, on the same day of the week. Check often to make sure the ring is still in place. If the ring was out of the vagina for an unknown amount of time, you may not be protected from pregnancy. Perform a pregnancy test and  call your doctor. Do not use more often than directed. A patient package insert for the product will be given with each prescription and refill. Read this sheet carefully each time. The sheet may change frequently. Contact your pediatrician regarding the use of this medicine in children. Special care may be needed. Overdosage: If you think you have taken too much of this medicine contact a poison control center or emergency room at once. NOTE: This medicine is only for you. Do not share this medicine with others. What if I miss a dose? You will need to use the ring exactly as directed. It is very important to follow the schedule every cycle. If you do not use the ring as directed, you may not be protected from pregnancy. If the ring should slip out, is lost, or if you leave it in longer or shorter than you should, contact your health care professional for advice. What may interact with this medicine? Do not take this medicine with the following medications:  dasabuvir; ombitasvir; paritaprevir; ritonavir  ombitasvir; paritaprevir; ritonavir  vaginal lubricants or other vaginal products that are oil-based or silicone-based This medicine may also interact with the following medications:  acetaminophen  antibiotics or medicines for infections, especially rifampin, rifabutin, rifapentine, and griseofulvin, and possibly penicillins or tetracyclines  aprepitant or fosaprepitant  armodafinil  ascorbic acid (vitamin C)  barbiturate medicines, such as phenobarbital or primidone  bosentan  certain antiviral medicines for hepatitis, HIV or AIDS  certain medicines for cancer treatment  certain medicines for seizures like carbamazepine, clobazam, felbamate, lamotrigine, oxcarbazepine, phenytoin,   rufinamide, topiramate  certain medicines for treating high cholesterol  cyclosporine  dantrolene  elagolix  flibanserin  grapefruit juice  lesinurad  medicines for diabetes  medicines  to treat fungal infections, such as griseofulvin, miconazole, fluconazole, ketoconazole, itraconazole, posaconazole or voriconazole  mifepristone  mitotane  modafinil  morphine  mycophenolate  St. John's wort  tamoxifen  temazepam  theophylline or aminophylline  thyroid hormones  tizanidine  tranexamic acid  ulipristal  warfarin This list may not describe all possible interactions. Give your health care provider a list of all the medicines, herbs, non-prescription drugs, or dietary supplements you use. Also tell them if you smoke, drink alcohol, or use illegal drugs. Some items may interact with your medicine. What should I watch for while using this medicine? Visit your doctor or health care professional for regular checks on your progress. You will need a regular breast and pelvic exam and Pap smear while on this medicine. Check with your doctor or health care professional to see if you need an additional method of contraception during the first cycle that you use this ring. Female condoms (made with natural rubber latex, polyisoprene, and polyurethane) and spermicides may be used. Do not use a diaphragm, cervical cap, or a female condom, as the ring can interfere with these birth control methods and their proper placement. If you have any reason to think you are pregnant, stop using this medicine right away and contact your doctor or health care professional. If you are using this medicine for hormone related problems, it may take several cycles of use to see improvement in your condition. Smoking increases the risk of getting a blood clot or having a stroke while you are using hormonal birth control, especially if you are more than 31 years old. You are strongly advised not to smoke. Some women are prone to getting dark patches on the skin of the face (cholasma). Your risk of getting chloasma with this medicine is higher if you had chloasma during a pregnancy. Keep out of the  sun. If you cannot avoid being in the sun, wear protective clothing and use sunscreen. Do not use sun lamps or tanning beds/booths. This medicine can make your body retain fluid, making your fingers, hands, or ankles swell. Your blood pressure can go up. Contact your doctor or health care professional if you feel you are retaining fluid. If you are going to have elective surgery, you may need to stop using this medicine before the surgery. Consult your health care professional for advice. This medicine does not protect you against HIV infection (AIDS) or any other sexually transmitted diseases. What side effects may I notice from receiving this medicine? Side effects that you should report to your doctor or health care professional as soon as possible:  allergic reactions such as skin rash or itching, hives, swelling of the lips, mouth, tongue, or throat  depression  high blood pressure  migraines or severe, sudden headaches  signs and symptoms of a blood clot such as breathing problems; changes in vision; chest pain; severe, sudden headache; pain, swelling, warmth in the leg; trouble speaking; sudden numbness or weakness of the face, arm or leg  signs and symptoms of infection like fever or chills with dizziness and a sunburn-like rash, or pain or trouble passing urine  stomach pain  symptoms of vaginal infection like itching, irritation or unusual discharge  yellowing of the eyes or skin Side effects that usually do not require medical attention (report these to your doctor   or health care professional if they continue or are bothersome):  acne  breast pain, tenderness  irregular vaginal bleeding or spotting, particularly during the first month of use  mild headache  nausea  painful periods  vomiting This list may not describe all possible side effects. Call your doctor for medical advice about side effects. You may report side effects to FDA at 1-800-FDA-1088. Where should I  keep my medicine? Keep out of the reach of children. Store unopened medicine for up to 4 months at room temperature at 15 and 30 degrees C (59 and 86 degrees F). Protect from light. Do not store above 30 degrees C (86 degrees F). Throw away any unused medicine 4 months after the dispense date or the expiration date, whichever comes first. A ring may only be used for 1 cycle (1 month). After the 3-week cycle, a used ring is removed and should be placed in the re-closable foil pouch and discarded in the trash out of reach of children and pets. Do NOT flush down the toilet. NOTE: This sheet is a summary. It may not cover all possible information. If you have questions about this medicine, talk to your doctor, pharmacist, or health care provider.  2020 Elsevier/Gold Standard (2019-05-23 12:31:47)  

## 2020-02-24 LAB — CYTOLOGY - PAP
Comment: NEGATIVE
Diagnosis: NEGATIVE
High risk HPV: NEGATIVE

## 2020-03-10 ENCOUNTER — Encounter: Payer: Self-pay | Admitting: Obstetrics and Gynecology

## 2020-04-24 ENCOUNTER — Other Ambulatory Visit: Payer: Self-pay | Admitting: Primary Care

## 2020-04-24 DIAGNOSIS — F331 Major depressive disorder, recurrent, moderate: Secondary | ICD-10-CM

## 2020-04-24 DIAGNOSIS — F411 Generalized anxiety disorder: Secondary | ICD-10-CM

## 2020-07-25 ENCOUNTER — Other Ambulatory Visit: Payer: Self-pay | Admitting: Primary Care

## 2020-07-25 DIAGNOSIS — F331 Major depressive disorder, recurrent, moderate: Secondary | ICD-10-CM

## 2020-07-25 DIAGNOSIS — F411 Generalized anxiety disorder: Secondary | ICD-10-CM

## 2020-09-28 ENCOUNTER — Other Ambulatory Visit: Payer: Self-pay | Admitting: Primary Care

## 2020-09-28 ENCOUNTER — Telehealth: Payer: Self-pay | Admitting: Primary Care

## 2020-09-28 DIAGNOSIS — J4520 Mild intermittent asthma, uncomplicated: Secondary | ICD-10-CM

## 2020-09-28 DIAGNOSIS — F411 Generalized anxiety disorder: Secondary | ICD-10-CM

## 2020-09-28 DIAGNOSIS — F331 Major depressive disorder, recurrent, moderate: Secondary | ICD-10-CM

## 2020-09-28 NOTE — Telephone Encounter (Signed)
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  Please advise  Pharmacy : [facility]: Address: [address] [place], [place] [address]  [phone]

## 2020-09-29 MED ORDER — ALBUTEROL SULFATE HFA 108 (90 BASE) MCG/ACT IN AERS: 2.0000 | INHALATION_SPRAY | Freq: Four times a day (QID) | RESPIRATORY_TRACT | 0 refills | Status: DC | PRN

## 2020-09-29 NOTE — Telephone Encounter (Signed)
Last time refilled was 08/15/2018 only uses PRN  I have made CPE app for 10/06/2020. Ok to send in refill

## 2020-09-29 NOTE — Telephone Encounter (Signed)
Noted, albuterol inhaler sent to pharmacy.

## 2020-10-06 ENCOUNTER — Encounter: Payer: Self-pay | Admitting: Primary Care

## 2020-10-06 ENCOUNTER — Ambulatory Visit (INDEPENDENT_AMBULATORY_CARE_PROVIDER_SITE_OTHER): Payer: BC Managed Care – PPO | Admitting: Primary Care

## 2020-10-06 ENCOUNTER — Other Ambulatory Visit: Payer: Self-pay

## 2020-10-06 VITALS — BP 124/73 | HR 78 | Temp 97.6°F | Ht 62.0 in | Wt 197.0 lb

## 2020-10-06 DIAGNOSIS — Z0000 Encounter for general adult medical examination without abnormal findings: Secondary | ICD-10-CM | POA: Diagnosis not present

## 2020-10-06 DIAGNOSIS — Z114 Encounter for screening for human immunodeficiency virus [HIV]: Secondary | ICD-10-CM | POA: Diagnosis not present

## 2020-10-06 DIAGNOSIS — J4520 Mild intermittent asthma, uncomplicated: Secondary | ICD-10-CM

## 2020-10-06 DIAGNOSIS — Z8349 Family history of other endocrine, nutritional and metabolic diseases: Secondary | ICD-10-CM

## 2020-10-06 DIAGNOSIS — F331 Major depressive disorder, recurrent, moderate: Secondary | ICD-10-CM

## 2020-10-06 DIAGNOSIS — Z1159 Encounter for screening for other viral diseases: Secondary | ICD-10-CM | POA: Diagnosis not present

## 2020-10-06 DIAGNOSIS — F411 Generalized anxiety disorder: Secondary | ICD-10-CM

## 2020-10-06 NOTE — Progress Notes (Signed)
Subjective:    Patient ID: Katherine Ruiz, female    DOB: January 08, 1991, 29 y.o.   MRN: [id number]  HPI  This visit occurred during the SARS-CoV-2 public health emergency.  Safety protocols were in place, including screening questions prior to the visit, additional usage of staff PPE, and extensive cleaning of exam room while observing appropriate contact time as indicated for disinfecting solutions.   Katherine Ruiz is a 29 year old female who presents today for complete physical.  Immunizations: -Tetanus: Completed in 2015 -Influenza: Will get this season  -Covid-19: Completed series   Diet: She endorses a fair diet.  Exercise: She is walking four times weekly.  Eye exam: No recent exam. Dental exam: Completes regularly   Pap Smear: Completed in 2021  BP Readings from Last 3 Encounters:  10/06/20 124/73  02/20/20 120/80  12/23/19 120/70     Review of Systems  Constitutional: Negative for unexpected weight change.  HENT: Negative for rhinorrhea.   Eyes: Negative for visual disturbance.  Respiratory: Negative for cough and shortness of breath.   Cardiovascular: Negative for chest pain.  Gastrointestinal: Negative for constipation and diarrhea.  Genitourinary: Negative for difficulty urinating and menstrual problem.  Musculoskeletal: Negative for arthralgias and myalgias.  Skin: Negative for rash.  Allergic/Immunologic: Positive for environmental allergies.  Neurological: Negative for dizziness, numbness and headaches.  Psychiatric/Behavioral: The patient is not nervous/anxious.        Past Medical History:  Diagnosis Date  . Asthma   . Chickenpox   . Depression   . Family history of breast cancer    4/21 cancer genetic testing letter sent  . History of UTI   . Kidney stone   . MRSA (methicillin resistant staph aureus) culture positive      Social History   Socioeconomic History  . Marital status: Married    Spouse name: Not on file  . Number of children: Not  on file  . Years of education: Not on file  . Highest education level: Not on file  Occupational History  . Not on file  Tobacco Use  . Smoking status: Former Games developer  . Smokeless tobacco: Never Used  Substance and Sexual Activity  . Alcohol use: No    Alcohol/week: 0.0 standard drinks  . Drug use: No  . Sexual activity: Not Currently  Other Topics Concern  . Not on file  Social History Narrative   Married   2 children born 2010 and 2011, both girls   Works as an Airline pilot for an [employer]   Enjoys walking, running, spending time with her family   12/22/2016   Social Determinants of Health   Financial Resource Strain:   . Difficulty of Paying Living Expenses: Not on file  Food Insecurity:   . Worried About Programme researcher, broadcasting/film/video in the Last Year: Not on file  . Ran Out of Food in the Last Year: Not on file  Transportation Needs:   . Lack of Transportation (Medical): Not on file  . Lack of Transportation (Non-Medical): Not on file  Physical Activity:   . Days of Exercise per Week: Not on file  . Minutes of Exercise per Session: Not on file  Stress:   . Feeling of Stress : Not on file  Social Connections:   . Frequency of Communication with Friends and Family: Not on file  . Frequency of Social Gatherings with Friends and Family: Not on file  . Attends Religious Services: Not on file  .  Active Member of Clubs or Organizations: Not on file  . Attends Banker Meetings: Not on file  . Marital Status: Not on file  Intimate Partner Violence:   . Fear of Current or Ex-Partner: Not on file  . Emotionally Abused: Not on file  . Physically Abused: Not on file  . Sexually Abused: Not on file    Past Surgical History:  Procedure Laterality Date  . FOOT SURGERY Left 2014    Family History  Problem Relation Age of Onset  . Arthritis Father   . Hyperlipidemia Father   . Hypertension Father   . Mental illness Father   . Diabetes Father   . Hyperlipidemia  Maternal Grandmother   . Arthritis Maternal Grandmother   . Breast cancer Maternal Grandmother   . Hypertension Maternal Grandmother   . Mental illness Maternal Grandmother   . Diabetes Maternal Grandmother   . Cancer Maternal Grandmother 50       Breast  . Cancer - Other Maternal Grandmother        bile duct  . Arthritis Maternal Grandfather   . Mental illness Paternal Grandmother   . Hypertension Paternal Grandmother   . Arthritis Paternal Grandmother   . Arthritis Paternal Grandfather   . Mental illness Paternal Grandfather   . Hypertension Paternal Grandfather     Allergies  Allergen Reactions  . Lamotrigine Hives  . Vancomycin Swelling  . Septra [Sulfamethoxazole-Trimethoprim] Rash    Current Outpatient Medications on File Prior to Visit  Medication Sig Dispense Refill  . albuterol (VENTOLIN HFA) 108 (90 Base) MCG/ACT inhaler Inhale 2 puffs into the lungs every 6 (six) hours as needed for shortness of breath. 1 each 0  . Ascorbic Acid (VITAMIN C) 1000 MG tablet Take 1,000 mg by mouth daily.    . citalopram (CELEXA) 40 MG tablet TAKE 1 TABLET (40 MG TOTAL) BY MOUTH DAILY. FOR ANXIETY AND DEPRESSION. 90 tablet 0  . etonogestrel-ethinyl estradiol (NUVARING) 0.12-0.015 MG/24HR vaginal ring Insert vaginally and leave in place for 3 consecutive weeks (or for 24 days), then remove for 1 week (or for 4 days). 3 each 3  . Probiotic Product (PROBIOTIC DAILY PO) Take by mouth daily.     No current facility-administered medications on file prior to visit.    BP 124/73   Pulse 78   Temp 97.6 F (36.4 C) (Temporal)   Ht 5\' 2"  (1.575 m)   Wt 197 lb (89.4 kg)   SpO2 96%   BMI 36.03 kg/m    Objective:   Physical Exam HENT:     Right Ear: Tympanic membrane and ear canal normal.     Left Ear: Tympanic membrane and ear canal normal.  Eyes:     Pupils: Pupils are equal, round, and reactive to light.  Cardiovascular:     Rate and Rhythm: Normal rate and regular rhythm.    Pulmonary:     Effort: Pulmonary effort is normal.     Breath sounds: Normal breath sounds.  Abdominal:     General: Bowel sounds are normal.     Palpations: Abdomen is soft.     Tenderness: There is no abdominal tenderness.  Musculoskeletal:        General: Normal range of motion.     Cervical back: Neck supple.  Skin:    General: Skin is warm and dry.  Neurological:     Mental Status: She is alert and oriented to person, place, and time.     Cranial  Nerves: No cranial nerve deficit.     Deep Tendon Reflexes:     Reflex Scores:      Patellar reflexes are 2+ on the right side and 2+ on the left side. Psychiatric:        Mood and Affect: Mood normal.            Assessment & Plan:

## 2020-10-06 NOTE — Assessment & Plan Note (Addendum)
Well controlled on celexa 40mg daily.  Continue this regimen.  Agree with assessment and plan. Katherine K Clark, NP   

## 2020-10-06 NOTE — Assessment & Plan Note (Addendum)
Intermittent with seasonal changes, well controlled on prn albuterol she is using very infrequently once or twice a month with exercise.   Exam unremarkable.   Agree with assessment and plan. Doreene Nest, NP

## 2020-10-06 NOTE — Assessment & Plan Note (Signed)
Immunizations UTD. °Pap smear UTD  °Discussed the importance of a healthy diet and regular exercise in order for weight loss, and to reduce the risk of any potential medical problems. ° °Exam today unremarkable. °Labs pending. °

## 2020-10-06 NOTE — Patient Instructions (Signed)
Stop by the lab prior to leaving today. I will notify you of your results once received.   Ensure you are consuming 64 ounces of water daily.  Start exercising. You should be getting 150 minutes of moderate intensity exercise weekly.         Preventive Care 28-31 Years Old, Female Preventive care refers to visits with your health care provider and lifestyle choices that can promote health and wellness. This includes:  A yearly physical exam. This may also be called an annual well check.  Regular dental visits and eye exams.  Immunizations.  Screening for certain conditions.  Healthy lifestyle choices, such as eating a healthy diet, getting regular exercise, not using drugs or products that contain nicotine and tobacco, and limiting alcohol use. What can I expect for my preventive care visit? Physical exam Your health care provider will check your:  Height and weight. This may be used to calculate body mass index (BMI), which tells if you are at a healthy weight.  Heart rate and blood pressure.  Skin for abnormal spots. Counseling Your health care provider may ask you questions about your:  Alcohol, tobacco, and drug use.  Emotional well-being.  Home and relationship well-being.  Sexual activity.  Eating habits.  Work and work Statistician.  Method of birth control.  Menstrual cycle.  Pregnancy history. What immunizations do I need?  Influenza (flu) vaccine  This is recommended every year. Tetanus, diphtheria, and pertussis (Tdap) vaccine  You may need a Td booster every 10 years. Varicella (chickenpox) vaccine  You may need this if you have not been vaccinated. Human papillomavirus (HPV) vaccine  If recommended by your health care provider, you may need three doses over 6 months. Measles, mumps, and rubella (MMR) vaccine  You may need at least one dose of MMR. You may also need a second dose. Meningococcal conjugate (MenACWY) vaccine  One dose is  recommended if you are age 33-21 years and a first-year college student living in a residence hall, or if you have one of several medical conditions. You may also need additional booster doses. Pneumococcal conjugate (PCV13) vaccine  You may need this if you have certain conditions and were not previously vaccinated. Pneumococcal polysaccharide (PPSV23) vaccine  You may need one or two doses if you smoke cigarettes or if you have certain conditions. Hepatitis A vaccine  You may need this if you have certain conditions or if you travel or work in places where you may be exposed to hepatitis A. Hepatitis B vaccine  You may need this if you have certain conditions or if you travel or work in places where you may be exposed to hepatitis B. Haemophilus influenzae type b (Hib) vaccine  You may need this if you have certain conditions. You may receive vaccines as individual doses or as more than one vaccine together in one shot (combination vaccines). Talk with your health care provider about the risks and benefits of combination vaccines. What tests do I need?  Blood tests  Lipid and cholesterol levels. These may be checked every 5 years starting at age 14.  Hepatitis C test.  Hepatitis B test. Screening  Diabetes screening. This is done by checking your blood sugar (glucose) after you have not eaten for a while (fasting).  Sexually transmitted disease (STD) testing.  BRCA-related cancer screening. This may be done if you have a family history of breast, ovarian, tubal, or peritoneal cancers.  Pelvic exam and Pap test. This may be done  every 3 years starting at age 48. Starting at age 21, this may be done every 5 years if you have a Pap test in combination with an HPV test. Talk with your health care provider about your test results, treatment options, and if necessary, the need for more tests. Follow these instructions at home: Eating and drinking   Eat a diet that includes fresh  fruits and vegetables, whole grains, lean protein, and low-fat dairy.  Take vitamin and mineral supplements as recommended by your health care provider.  Do not drink alcohol if: ? Your health care provider tells you not to drink. ? You are pregnant, may be pregnant, or are planning to become pregnant.  If you drink alcohol: ? Limit how much you have to 0-1 drink a day. ? Be aware of how much alcohol is in your drink. In the [place] one drink equals one 12 oz bottle of beer (355 mL), one 5 oz glass of wine (148 mL), or one 1 oz glass of hard liquor (44 mL). Lifestyle  Take daily care of your teeth and gums.  Stay active. Exercise for at least 30 minutes on 5 or more days each week.  Do not use any products that contain nicotine or tobacco, such as cigarettes, e-cigarettes, and chewing tobacco. If you need help quitting, ask your health care provider.  If you are sexually active, practice safe sex. Use a condom or other form of birth control (contraception) in order to prevent pregnancy and STIs (sexually transmitted infections). If you plan to become pregnant, see your health care provider for a preconception visit. What's next?  Visit your health care provider once a year for a well check visit.  Ask your health care provider how often you should have your eyes and teeth checked.  Stay up to date on all vaccines. This information is not intended to replace advice given to you by your health care provider. Make sure you discuss any questions you have with your health care provider. Document Revised: 07/12/2018 Document Reviewed: 07/12/2018 Elsevier Patient Education  2020 [employer].

## 2020-10-06 NOTE — Progress Notes (Signed)
   Subjective:    Patient ID: Katherine Ruiz, female    DOB: [birth year], 29 y.o.   MRN: [id number]  HPI  This visit occurred during the SARS-CoV-2 public health emergency.  Safety protocols were in place, including screening questions prior to the visit, additional usage of staff PPE, and extensive cleaning of exam room while observing appropriate contact time as indicated for disinfecting solutions.   Katherine Ruiz is a 29 year old female with a history of asthma, sciatica & major depressive disorder/GAD who presents today for complete physical.  Immunizations: -Tetanus: UTD -Influenza: Plan for week  -Covid-19: completed series    Diet: Reports fair   Exercise: She is walking/running 4 times a week   Filed Weights   10/06/20 1340  Weight: 197 lb (89.4 kg)   Eye exam: Due  Dental exam: twice yearly   Pap Smear: UTD  Hep C Screen: Will update today   Review of Systems  HENT: Negative.   Eyes: Negative.   Respiratory: Negative.  Negative for chest tightness and shortness of breath.   Cardiovascular: Negative.   Gastrointestinal: Negative.   Musculoskeletal: Negative.   Neurological: Negative.   Psychiatric/Behavioral: Negative.        Objective:   Physical Exam Constitutional:      Appearance: Normal appearance.  HENT:     Head: Normocephalic.     Nose: Nose normal.     Mouth/Throat:     Mouth: Mucous membranes are moist.  Eyes:     Pupils: Pupils are equal, round, and reactive to light.  Cardiovascular:     Rate and Rhythm: Normal rate.  Pulmonary:     Effort: Pulmonary effort is normal.  Abdominal:     Palpations: Abdomen is soft.  Musculoskeletal:        General: Normal range of motion.     Cervical back: Normal range of motion.  Skin:    General: Skin is warm and dry.     Capillary Refill: Capillary refill takes less than 2 seconds.  Neurological:     General: No focal deficit present.     Mental Status: She is alert and oriented to person, place,  and time.  Psychiatric:        Mood and Affect: Mood normal.        Behavior: Behavior normal.           Assessment & Plan:

## 2020-10-06 NOTE — Assessment & Plan Note (Addendum)
Well controlled on celexa 40mg  daily.  Continue this regimen.  Agree with assessment and plan. , NP

## 2020-10-07 ENCOUNTER — Telehealth: Payer: Self-pay | Admitting: Primary Care

## 2020-10-07 LAB — TSH: TSH: 2.41 u[IU]/mL (ref 0.35–4.50)

## 2020-10-07 LAB — CBC
HCT: 36.5 % (ref 36.0–46.0)
Hemoglobin: 12.2 g/dL (ref 12.0–15.0)
MCHC: 33.4 g/dL (ref 30.0–36.0)
MCV: 98.4 fl (ref 78.0–100.0)
Platelets: 287 10*3/uL (ref 150.0–400.0)
RBC: 3.71 Mil/uL — ABNORMAL LOW (ref 3.87–5.11)
RDW: 12.7 % (ref 11.5–15.5)
WBC: 10.4 10*3/uL (ref 4.0–10.5)

## 2020-10-07 LAB — LIPID PANEL
Cholesterol: 160 mg/dL (ref 0–200)
HDL: 70.5 mg/dL (ref >39.00)
LDL Cholesterol: 67 mg/dL (ref 0–99)
NonHDL: 89.72
Total CHOL/HDL Ratio: 2
Triglycerides: 112 mg/dL (ref 0.0–149.0)
VLDL: 22.4 mg/dL (ref 0.0–40.0)

## 2020-10-07 LAB — COMPREHENSIVE METABOLIC PANEL
ALT: 10 U/L (ref 0–35)
AST: 12 U/L (ref 0–37)
Albumin: 3.9 g/dL (ref 3.5–5.2)
Alkaline Phosphatase: 28 U/L — ABNORMAL LOW (ref 39–117)
BUN: 16 mg/dL (ref 6–23)
CO2: 29 mEq/L (ref 19–32)
Calcium: 9.1 mg/dL (ref 8.4–10.5)
Chloride: 103 mEq/L (ref 96–112)
Creatinine, Ser: 0.92 mg/dL (ref 0.40–1.20)
GFR: 82.97 mL/min (ref >60.00)
Glucose, Bld: 75 mg/dL (ref 70–99)
Potassium: 4.5 mEq/L (ref 3.5–5.1)
Sodium: 139 mEq/L (ref 135–145)
Total Bilirubin: 0.3 mg/dL (ref 0.2–1.2)
Total Protein: 6.6 g/dL (ref 6.0–8.3)

## 2020-10-07 LAB — HEMOGLOBIN A1C: Hgb A1c MFr Bld: 5.3 % (ref 4.6–6.5)

## 2020-10-07 NOTE — Telephone Encounter (Signed)
Noted, will evaluate. 

## 2020-10-07 NOTE — Telephone Encounter (Signed)
We just saw her yesterday and she made no mention of this. Did she just now start having symptoms? We need to see her and collect a urine sample in order to properly diagnose and treat. I can add her onto Friday's schedule.

## 2020-10-07 NOTE — Telephone Encounter (Signed)
I Called pt and she stated that she started having lower lt side back pain this morning, worsening and now upper back pain along with frequent urination.  Pt is asking for something to be sent into pharmacy today.  Please advise.

## 2020-10-07 NOTE — Telephone Encounter (Signed)
Called patient added to Friday. If symptoms increase she will go to walk in and let us know to c/a app. Symptoms started today

## 2020-10-07 NOTE — Telephone Encounter (Signed)
Pt called in wanted to know about getting something for pain or antibiotic for this kidney infection.

## 2020-10-09 ENCOUNTER — Ambulatory Visit: Payer: BC Managed Care – PPO | Admitting: Primary Care

## 2020-10-09 LAB — HEPATITIS C ANTIBODY
Hepatitis C Ab: NONREACTIVE
SIGNAL TO CUT-OFF: 0.01 (ref <1.00)

## 2020-10-09 LAB — HIV ANTIBODY (ROUTINE TESTING W REFLEX): HIV 1&2 Ab, 4th Generation: NONREACTIVE

## 2020-10-21 ENCOUNTER — Ambulatory Visit: Payer: BC Managed Care – PPO

## 2020-10-21 ENCOUNTER — Other Ambulatory Visit: Payer: Self-pay

## 2020-10-22 ENCOUNTER — Other Ambulatory Visit: Payer: Self-pay | Admitting: Primary Care

## 2020-10-22 DIAGNOSIS — J4520 Mild intermittent asthma, uncomplicated: Secondary | ICD-10-CM

## 2020-10-22 NOTE — Telephone Encounter (Signed)
Refill not appropriate, will decline refill request.

## 2020-10-22 NOTE — Telephone Encounter (Signed)
Ok to refill 

## 2020-11-06 ENCOUNTER — Other Ambulatory Visit: Payer: Self-pay | Admitting: Primary Care

## 2020-11-06 DIAGNOSIS — F411 Generalized anxiety disorder: Secondary | ICD-10-CM

## 2020-11-06 DIAGNOSIS — F331 Major depressive disorder, recurrent, moderate: Secondary | ICD-10-CM

## 2021-03-06 IMAGING — US US OB < 14 WEEKS - US OB TV
1 series · 14 of 28 positions shown · non-contrast
Comparison: None.

CLINICAL DATA: Vaginal bleeding

EXAM:
OBSTETRIC <14 WK US AND TRANSVAGINAL OB US
TECHNIQUE: Both transabdominal and transvaginal ultrasound examinations were
performed for complete evaluation of the gestation as well as the
maternal uterus, adnexal regions, and pelvic cul-de-sac.
Transvaginal technique was performed to assess early pregnancy.

[Series 1: us ob < 14 weeks - us ob tv · 14 of 37 slices shown]
[im 2/37]
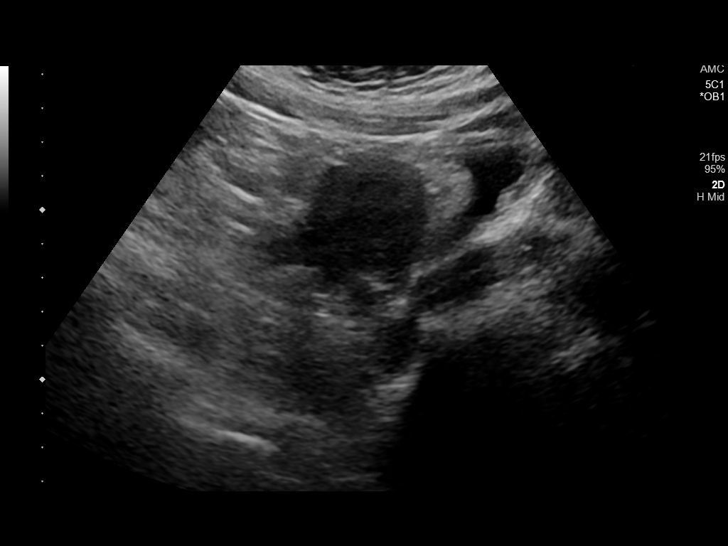
[im 5/37]
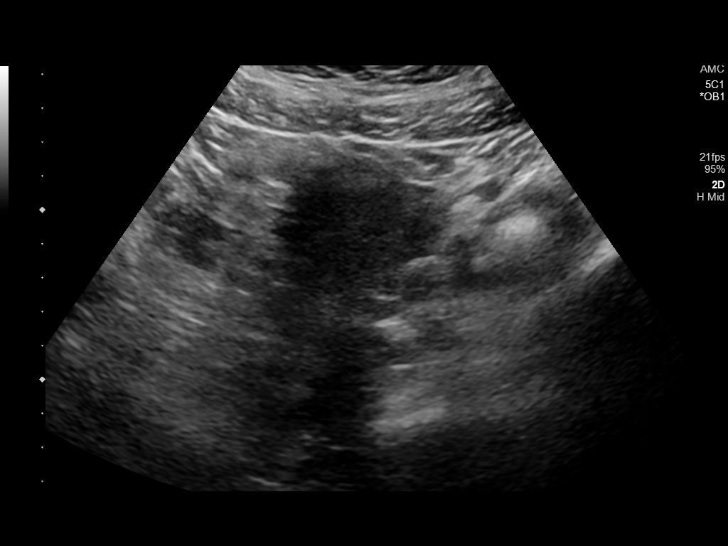
[im 7/37]
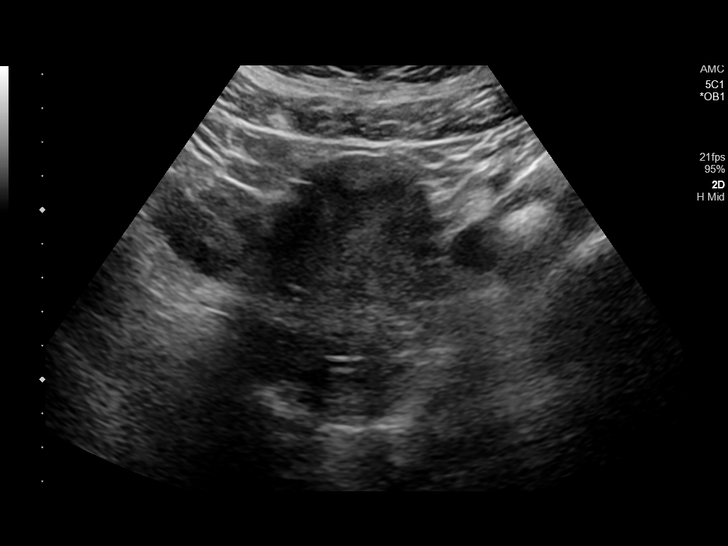
[im 10/37]
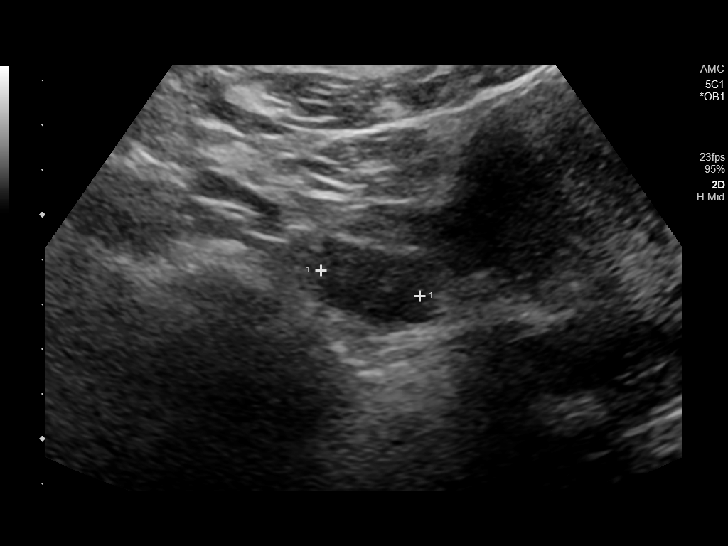
[im 13/37]
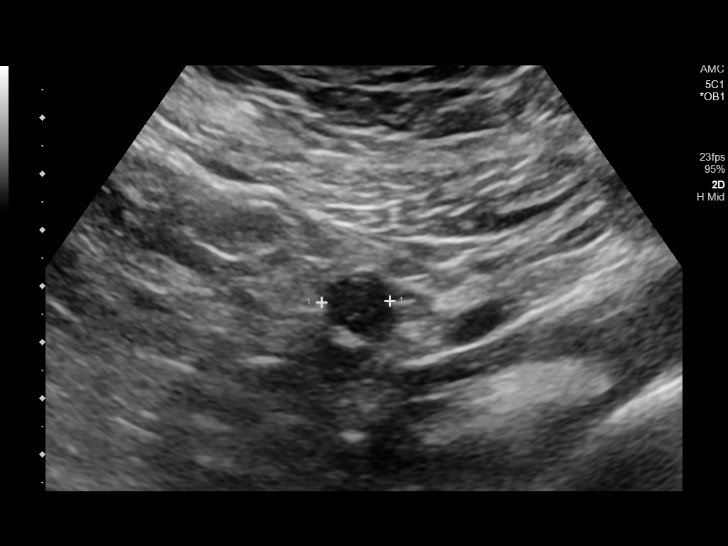
[im 15/37]
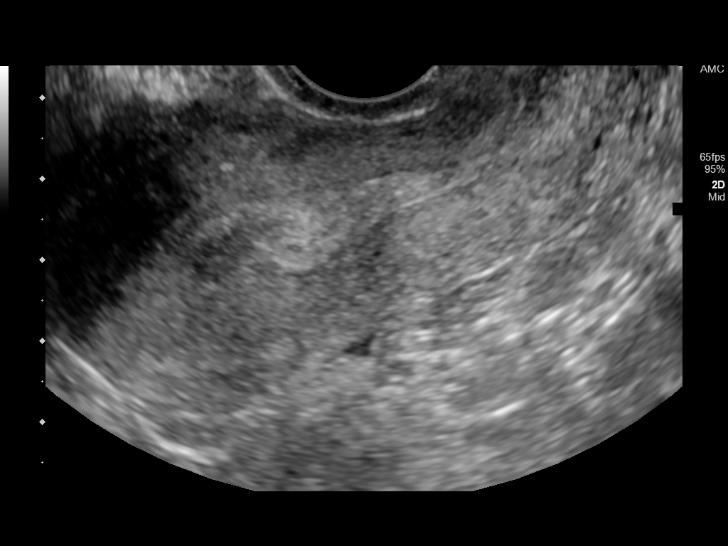
[im 18/37]
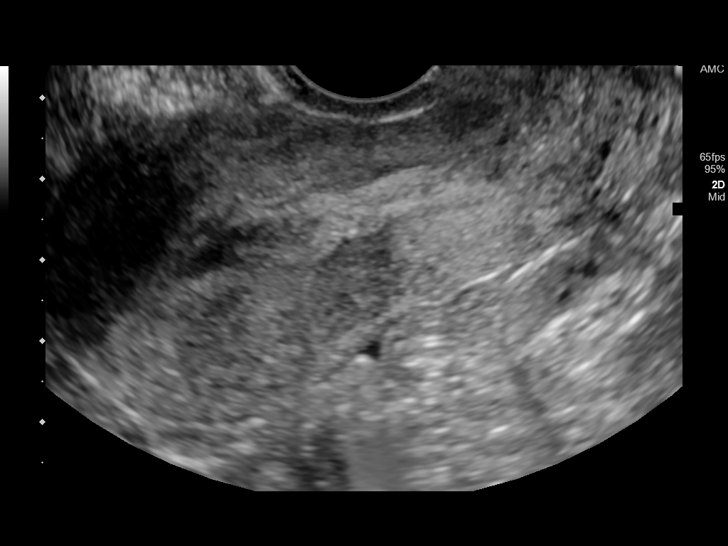
[im 21/37]
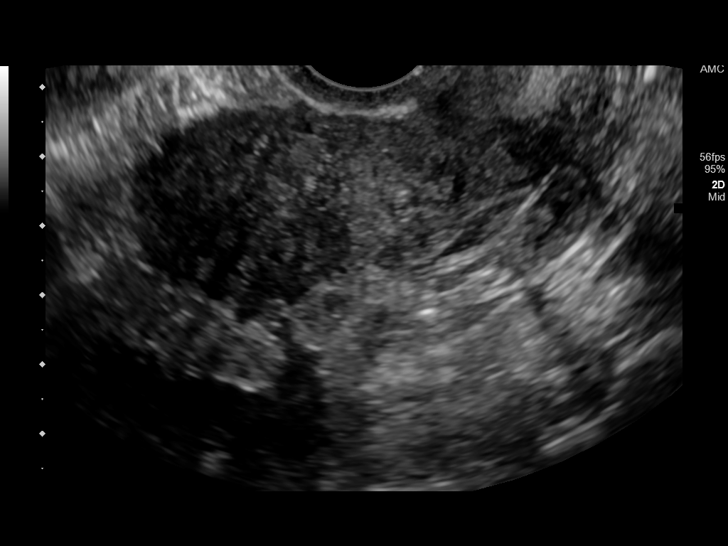
[im 23/37]
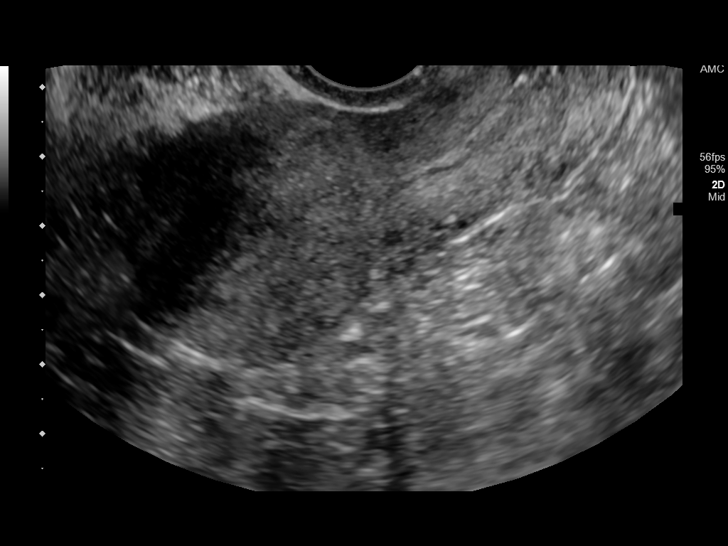
[im 26/37]
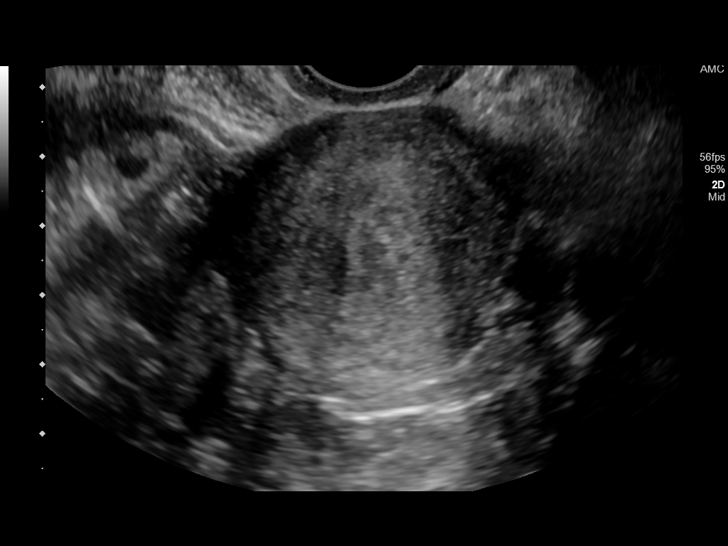
[im 29/37]
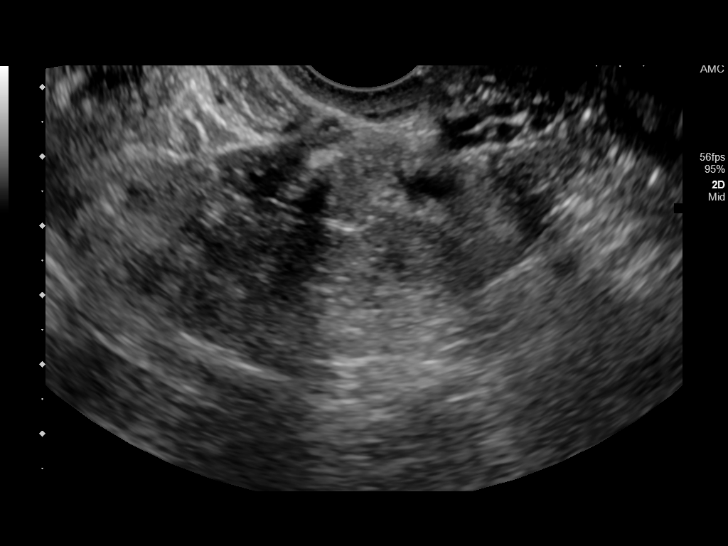
[im 31/37]
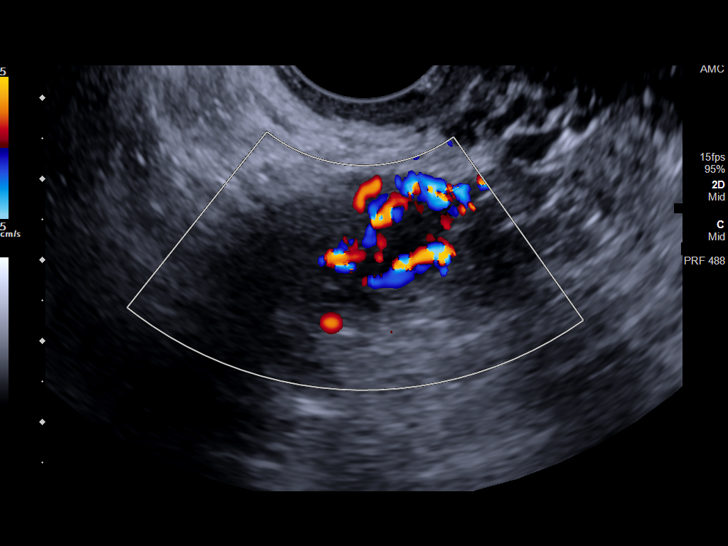
[im 34/37]
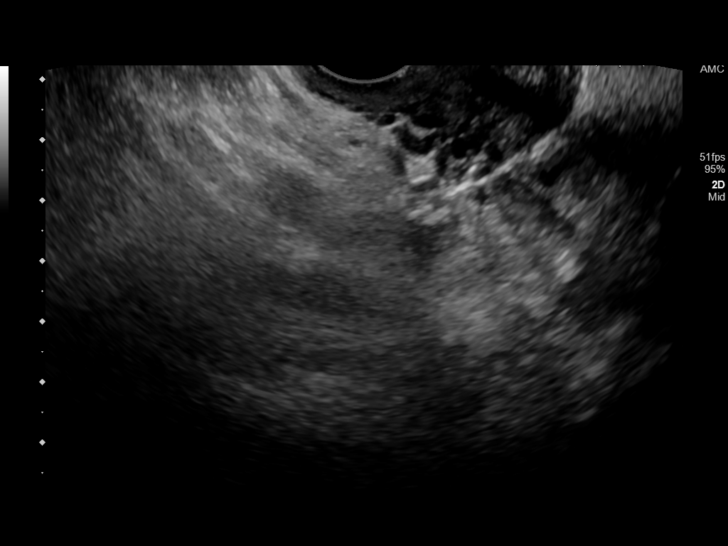
[im 37/37]
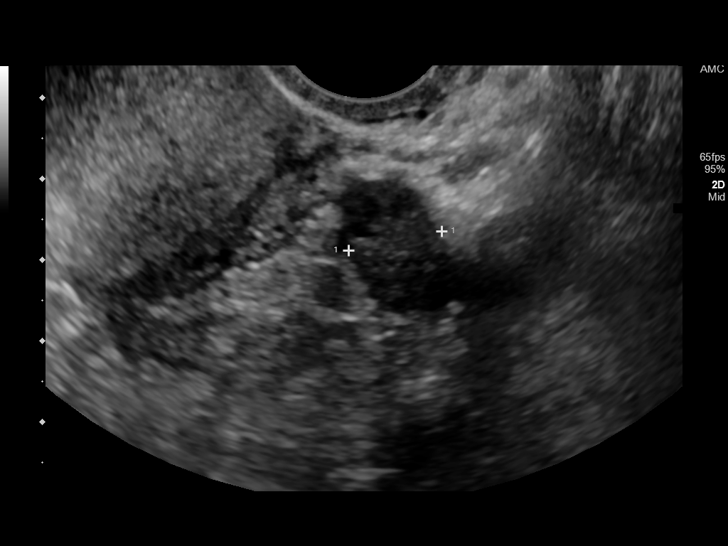

[14 of 28 positions shown; findings below may reference images not displayed]

FINDINGS: Intrauterine gestational sac: None

Yolk sac:  Not Visualized.

Subchorionic hemorrhage:  None visualized.

Maternal uterus/adnexae: No intrauterine pregnancy is seen. There is
a thickened heterogeneous endometrium measuring 0.9 cm. A probable
corpus luteum cyst seen within the right ovary. The left ovary is
normal in appearance.
IMPRESSION: No gestational sac, yolk sac, fetal pole, or cardiac activity yet
visualized. Recommend follow-up quantitative B-HCG levels and
follow-up US in 14 days to assess intrauterine pregnancy. This
recommendation follows SRU consensus guidelines: Diagnostic Criteria
for Nonviable Pregnancy Early in the First Trimester. N Engl J Med

## 2021-04-26 ENCOUNTER — Other Ambulatory Visit: Payer: Self-pay | Admitting: Obstetrics & Gynecology

## 2021-04-26 DIAGNOSIS — Z3044 Encounter for surveillance of vaginal ring hormonal contraceptive device: Secondary | ICD-10-CM

## 2021-07-05 ENCOUNTER — Telehealth: Payer: Self-pay

## 2021-07-05 DIAGNOSIS — Z3044 Encounter for surveillance of vaginal ring hormonal contraceptive device: Secondary | ICD-10-CM

## 2021-07-05 MED ORDER — ETONOGESTREL-ETHINYL ESTRADIOL 0.12-0.015 MG/24HR VA RING: VAGINAL_RING | VAGINAL | 3 refills | Status: DC

## 2021-07-05 NOTE — Telephone Encounter (Signed)
Pt calling; has appt sched 9/16th; needs nuvaring refill.  [phone]  [facility] - refill eRx'd.

## 2021-07-07 ENCOUNTER — Ambulatory Visit: Payer: BC Managed Care – PPO | Admitting: Family Medicine

## 2021-07-07 ENCOUNTER — Other Ambulatory Visit: Payer: Self-pay

## 2021-07-07 ENCOUNTER — Encounter: Payer: Self-pay | Admitting: Family Medicine

## 2021-07-07 VITALS — BP 126/68 | HR 68 | Temp 98.1°F | Ht 62.0 in | Wt 207.4 lb

## 2021-07-07 DIAGNOSIS — H6121 Impacted cerumen, right ear: Secondary | ICD-10-CM

## 2021-07-07 DIAGNOSIS — H6090 Unspecified otitis externa, unspecified ear: Secondary | ICD-10-CM | POA: Insufficient documentation

## 2021-07-07 DIAGNOSIS — H60391 Other infective otitis externa, right ear: Secondary | ICD-10-CM

## 2021-07-07 DIAGNOSIS — H6120 Impacted cerumen, unspecified ear: Secondary | ICD-10-CM | POA: Insufficient documentation

## 2021-07-07 MED ORDER — NEOMYCIN-POLYMYXIN-HC 3.5-10000-1 OT SOLN: 3.0000 [drp] | Freq: Three times a day (TID) | OTIC | 0 refills | Status: DC

## 2021-07-07 NOTE — Progress Notes (Signed)
Subjective:    Patient ID: Katherine Ruiz, female    DOB: 1991-05-10, 30 y.o.   MRN: [id number]  This visit occurred during the SARS-CoV-2 public health emergency.  Safety protocols were in place, including screening questions prior to the visit, additional usage of staff PPE, and extensive cleaning of exam room while observing appropriate contact time as indicated for disinfecting solutions.   HPI 30 yo pt of NP Clark presents with ear pain (right)  Wt Readings from Last 3 Encounters:  07/07/21 207 lb 6 oz (94.1 kg)  10/06/20 197 lb (89.4 kg)  02/20/20 186 lb (84.4 kg)   37.93 kg/m  Used q tip Discomfort in R ear-throbbing  Cannot hear as well  May have pushed in wax   Once had to irrigate ears  Does not use ear drops or use ear plugs   No congestion or nasal symptoms No fever  No headache   Patient Active Problem List   Diagnosis Date Noted   Cerumen impaction 07/07/2021   GAD (generalized anxiety disorder) 12/19/2018   Cervical radiculopathy 09/01/2018   Asthma 08/15/2018   Sciatica of right side 08/15/2018   Encounter for preventive care 02/12/2016   Major depressive disorder 11/20/2015   Past Medical History:  Diagnosis Date   Asthma    Chickenpox    Depression    Family history of breast cancer    4/21 cancer genetic testing letter sent   History of UTI    Kidney stone    MRSA (methicillin resistant staph aureus) culture positive    Past Surgical History:  Procedure Laterality Date   FOOT SURGERY Left 2014   Social History   Tobacco Use   Smoking status: Former   Smokeless tobacco: Never  Substance Use Topics   Alcohol use: No    Alcohol/week: 0.0 standard drinks   Drug use: No   Family History  Problem Relation Age of Onset   Arthritis Father    Hyperlipidemia Father    Hypertension Father    Mental illness Father    Diabetes Father    Hyperlipidemia Maternal Grandmother    Arthritis Maternal Grandmother    Breast cancer Maternal  Grandmother    Hypertension Maternal Grandmother    Mental illness Maternal Grandmother    Diabetes Maternal Grandmother    Cancer Maternal Grandmother 41       Breast   Cancer - Other Maternal Grandmother        bile duct   Arthritis Maternal Grandfather    Mental illness Paternal Grandmother    Hypertension Paternal Grandmother    Arthritis Paternal Grandmother    Arthritis Paternal Grandfather    Mental illness Paternal Grandfather    Hypertension Paternal Grandfather    Allergies  Allergen Reactions   Lamotrigine Hives   Vancomycin Swelling   Septra [Sulfamethoxazole-Trimethoprim] Rash   Current Outpatient Medications on File Prior to Visit  Medication Sig Dispense Refill   albuterol (VENTOLIN HFA) 108 (90 Base) MCG/ACT inhaler Inhale 2 puffs into the lungs every 6 (six) hours as needed for shortness of breath. 1 each 0   Ascorbic Acid (VITAMIN C) 1000 MG tablet Take 1,000 mg by mouth daily.     citalopram (CELEXA) 40 MG tablet TAKE 1 TABLET (40 MG TOTAL) BY MOUTH DAILY. FOR ANXIETY AND DEPRESSION. 90 tablet 2   etonogestrel-ethinyl estradiol (NUVARING) 0.12-0.015 MG/24HR vaginal ring Insert vaginally and leave in place for 3 consecutive weeks (or for 24 days), then remove for 1  week (or for 4 days). 3 each 3   Probiotic Product (PROBIOTIC DAILY PO) Take by mouth daily.     No current facility-administered medications on file prior to visit.      Review of Systems  Constitutional:  Negative for activity change, appetite change, fatigue, fever and unexpected weight change.  HENT:  Positive for ear pain and hearing loss. Negative for congestion, ear discharge, facial swelling, postnasal drip, rhinorrhea, sinus pressure, sinus pain and sore throat.   Eyes:  Negative for pain, redness and visual disturbance.  Respiratory:  Negative for cough, shortness of breath and wheezing.   Cardiovascular:  Negative for chest pain and palpitations.  Gastrointestinal:  Negative for  abdominal pain, blood in stool, constipation and diarrhea.  Endocrine: Negative for polydipsia and polyuria.  Genitourinary:  Negative for dysuria, frequency and urgency.  Musculoskeletal:  Negative for arthralgias, back pain and myalgias.  Skin:  Negative for pallor and rash.  Allergic/Immunologic: Negative for environmental allergies.  Neurological:  Negative for dizziness, syncope and headaches.  Hematological:  Negative for adenopathy. Does not bruise/bleed easily.  Psychiatric/Behavioral:  Negative for decreased concentration and dysphoric mood. The patient is not nervous/anxious.       Objective:   Physical Exam Constitutional:      General: She is not in acute distress.    Appearance: Normal appearance. She is obese. She is not ill-appearing.  HENT:     Head: Normocephalic and atraumatic.     Right Ear: There is impacted cerumen.     Left Ear: Tympanic membrane, ear canal and external ear normal. There is no impacted cerumen.     Ears:     Comments: R ear- cerumen impaction (deep) Nl ext ear and ear canal   S/p simple irrigation- clear canal but some distal erythema  No swelling or d/c TM appears nl    Eyes:     General:        Right eye: No discharge.        Left eye: No discharge.     Conjunctiva/sclera: Conjunctivae normal.     Pupils: Pupils are equal, round, and reactive to light.  Musculoskeletal:     Cervical back: Normal range of motion and neck supple.  Lymphadenopathy:     Cervical: No cervical adenopathy.  Skin:    Findings: No erythema or rash.  Neurological:     Mental Status: She is alert.     Cranial Nerves: No cranial nerve deficit.  Psychiatric:        Mood and Affect: Mood normal.          Assessment & Plan:   Problem List Items Addressed This Visit       Nervous and Auditory   Cerumen impaction - Primary    With some discomfort and muffled hearing after using a q tip  After consent obtained, ear irrigation done and cerumen  completely removed  Some residual erythema in canal from trauma, cortisporin solution px   Update if not starting to improve in a week or if worsening        Otitis externa    S/p cerumen impaction /trauma from q tip Some erythema of canal w/o swelling  Px cortisporin otic to use tid until improved Update if not starting to improve in a week or if worsening

## 2021-07-07 NOTE — Assessment & Plan Note (Addendum)
With some discomfort and muffled hearing after using a q tip  After consent obtained, ear irrigation done and cerumen completely removed  Some residual erythema in canal from trauma, cortisporin solution px   Update if not starting to improve in a week or if worsening

## 2021-07-07 NOTE — Assessment & Plan Note (Signed)
S/p cerumen impaction /trauma from q tip Some erythema of canal w/o swelling  Px cortisporin otic to use tid until improved Update if not starting to improve in a week or if worsening

## 2021-07-07 NOTE — Patient Instructions (Addendum)
Ear wax is gone  As water drains out of ear- should have relief   The canal is irritated so use the drops as directed 5-7 days   Update if not starting to improve in a week or if worsening   Try and keep water out of the ear for the next week also

## 2021-07-30 ENCOUNTER — Ambulatory Visit (INDEPENDENT_AMBULATORY_CARE_PROVIDER_SITE_OTHER): Payer: BC Managed Care – PPO | Admitting: Obstetrics & Gynecology

## 2021-07-30 ENCOUNTER — Encounter: Payer: Self-pay | Admitting: Obstetrics & Gynecology

## 2021-07-30 ENCOUNTER — Other Ambulatory Visit: Payer: Self-pay

## 2021-07-30 VITALS — BP 120/80 | Ht 61.0 in | Wt 203.0 lb

## 2021-07-30 DIAGNOSIS — Z01419 Encounter for gynecological examination (general) (routine) without abnormal findings: Secondary | ICD-10-CM

## 2021-07-30 DIAGNOSIS — N930 Postcoital and contact bleeding: Secondary | ICD-10-CM | POA: Diagnosis not present

## 2021-07-30 DIAGNOSIS — Z3044 Encounter for surveillance of vaginal ring hormonal contraceptive device: Secondary | ICD-10-CM | POA: Diagnosis not present

## 2021-07-30 MED ORDER — ETONOGESTREL-ETHINYL ESTRADIOL 0.12-0.015 MG/24HR VA RING: VAGINAL_RING | VAGINAL | 3 refills | Status: DC

## 2021-07-30 NOTE — Patient Instructions (Signed)
Thank you for choosing [facility]. As part of our ongoing efforts to improve patient experience, we would appreciate your feedback. Please fill out the short survey that you will receive by mail or MyChart. Your opinion is important to us! -Dr Dniyah Grant  

## 2021-07-30 NOTE — Progress Notes (Signed)
HPI:      [name] is a 30 y.o. [id number] who LMP was No LMP recorded. (Menstrual status: Other)., she presents today for her annual examination. The patient has no complaints today.  Reports bleeding w sex, and some pain w sex as well.  Her last pap: approximate date 2021 and was normal. The patient does perform self breast exams.  There is notable family history of breast cancer in her family.  The patient has regular exercise: yes.  The patient denies current symptoms of depression.    GYN History: Contraception: NuvaRing vaginal inserts Spotting monthly with this regimen  PMHx: Past Medical History:  Diagnosis Date   Asthma    Chickenpox    Depression    Family history of breast cancer    4/21 cancer genetic testing letter sent   History of UTI    Kidney stone    MRSA (methicillin resistant staph aureus) culture positive    Past Surgical History:  Procedure Laterality Date   FOOT SURGERY Left 2014   Family History  Problem Relation Age of Onset   Arthritis Father    Hyperlipidemia Father    Hypertension Father    Mental illness Father    Diabetes Father    Hyperlipidemia Maternal Grandmother    Arthritis Maternal Grandmother    Breast cancer Maternal Grandmother    Hypertension Maternal Grandmother    Mental illness Maternal Grandmother    Diabetes Maternal Grandmother    Cancer Maternal Grandmother 72       Breast   Cancer - Other Maternal Grandmother        bile duct   Arthritis Maternal Grandfather    Mental illness Paternal Grandmother    Hypertension Paternal Grandmother    Arthritis Paternal Grandmother    Arthritis Paternal Grandfather    Mental illness Paternal Grandfather    Hypertension Paternal Grandfather    Social History   Tobacco Use   Smoking status: Former   Smokeless tobacco: Never  Substance Use Topics   Alcohol use: No    Alcohol/week: 0.0 standard drinks   Drug use: No    Current Outpatient Medications:    albuterol  (VENTOLIN HFA) 108 (90 Base) MCG/ACT inhaler, Inhale 2 puffs into the lungs every 6 (six) hours as needed for shortness of breath., Disp: 1 each, Rfl: 0   Ascorbic Acid (VITAMIN C) 1000 MG tablet, Take 1,000 mg by mouth daily., Disp: , Rfl:    citalopram (CELEXA) 40 MG tablet, TAKE 1 TABLET (40 MG TOTAL) BY MOUTH DAILY. FOR ANXIETY AND DEPRESSION., Disp: 90 tablet, Rfl: 2   etonogestrel-ethinyl estradiol (NUVARING) 0.12-0.015 MG/24HR vaginal ring, Insert vaginally and leave in place for 3 consecutive weeks (or for 24 days), then remove for 1 week (or for 4 days)., Disp: 3 each, Rfl: 3   neomycin-polymyxin-hydrocortisone (CORTISPORIN) OTIC solution, Place 3 drops into the right ear 3 (three) times daily., Disp: 10 mL, Rfl: 0   Probiotic Product (PROBIOTIC DAILY PO), Take by mouth daily., Disp: , Rfl:  Allergies: Lamotrigine, Vancomycin, and Septra [sulfamethoxazole-trimethoprim]  Review of Systems  Constitutional:  Negative for chills, fever and malaise/fatigue.  HENT:  Negative for congestion, sinus pain and sore throat.   Eyes:  Negative for blurred vision and pain.  Respiratory:  Negative for cough and wheezing.   Cardiovascular:  Negative for chest pain and leg swelling.  Gastrointestinal:  Negative for abdominal pain, constipation, diarrhea, heartburn, nausea and vomiting.  Genitourinary:  Negative for  dysuria, frequency, hematuria and urgency.  Musculoskeletal:  Negative for back pain, joint pain, myalgias and neck pain.  Skin:  Negative for itching and rash.  Neurological:  Negative for dizziness, tremors and weakness.  Endo/Heme/Allergies:  Does not bruise/bleed easily.  Psychiatric/Behavioral:  Negative for depression. The patient is not nervous/anxious and does not have insomnia.    Objective: BP 120/80   Ht 5\' 1"  (1.549 m)   Wt 203 lb (92.1 kg)   BMI 38.36 kg/m   Filed Weights   07/30/21 1525  Weight: 203 lb (92.1 kg)   Body mass index is 38.36 kg/m. Physical  Exam Constitutional:      General: She is not in acute distress.    Appearance: She is well-developed.  Genitourinary:     Bladder, rectum and urethral meatus normal.     No lesions in the vagina.     Right Labia: No rash, tenderness or lesions.    Left Labia: No tenderness, lesions or rash.    No vaginal bleeding.      Right Adnexa: not tender and no mass present.    Left Adnexa: not tender and no mass present.    No cervical motion tenderness, friability, lesion or polyp.        Uterus is not enlarged.     No uterine mass detected.    Pelvic exam was performed with patient in the lithotomy position.  Breasts:    Right: No mass, skin change or tenderness.     Left: No mass, skin change or tenderness.  HENT:     Head: Normocephalic and atraumatic. No laceration.     Right Ear: Hearing normal.     Left Ear: Hearing normal.     Mouth/Throat:     Pharynx: Uvula midline.  Eyes:     Pupils: Pupils are equal, round, and reactive to light.  Neck:     Thyroid: No thyromegaly.  Cardiovascular:     Rate and Rhythm: Normal rate and regular rhythm.     Heart sounds: No murmur heard.   No friction rub. No gallop.  Pulmonary:     Effort: Pulmonary effort is normal. No respiratory distress.     Breath sounds: Normal breath sounds. No wheezing.  Abdominal:     General: Bowel sounds are normal. There is no distension.     Palpations: Abdomen is soft.     Tenderness: There is no abdominal tenderness. There is no rebound.  Musculoskeletal:        General: Normal range of motion.     Cervical back: Normal range of motion and neck supple.  Neurological:     Mental Status: She is alert and oriented to person, place, and time.     Cranial Nerves: No cranial nerve deficit.  Skin:    General: Skin is warm and dry.  Psychiatric:        Judgment: Judgment normal.  Vitals reviewed.    Assessment:  ANNUAL EXAM 1. Women's annual routine gynecological examination   2. Encounter for  surveillance of vaginal ring hormonal contraceptive device   3. PCB (post coital bleeding)      Plan 08/01/21 to assess    Consider procedure to address cervical ectropion/wideTZ (cryo, LEEP)   Screening Plan:            1.  Cervical Screening-  Pap smear schedule reviewed with patient, Pap smear to be scheduled 2024  2. Breast screening- Exam annually and mammogram>40 planned   3.  Colonoscopy every 10 years, Hemoccult testing - after age 54  4. Labs managed by PCP  5. Counseling for contraception: NuvaRing vaginal inserts  Upstream - 07/30/21 1527       Pregnancy Intention Screening   Does the patient want to become pregnant in the next year? No    Does the patient's partner want to become pregnant in the next year? No    Would the patient like to discuss contraceptive options today? No      Contraception Wrap Up   Current Method Vaginal Ring    End Method Vaginal Ring    Contraception Counseling Provided No            The pregnancy intention screening data noted above was reviewed. Potential methods of contraception were discussed. The patient elected to proceed with Vaginal Ring.       F/U  Return in about 1 week (around 08/06/2021) for [facility] [place] then f/u virtual PH.  Annamarie Major, MD, [facility], [facility] 07/30/2021  4:16 PM

## 2021-08-12 ENCOUNTER — Ambulatory Visit: Payer: BC Managed Care – PPO | Admitting: Obstetrics & Gynecology

## 2021-08-12 ENCOUNTER — Other Ambulatory Visit: Payer: BC Managed Care – PPO

## 2021-08-20 ENCOUNTER — Other Ambulatory Visit: Payer: Self-pay | Admitting: Primary Care

## 2021-08-20 ENCOUNTER — Other Ambulatory Visit: Payer: Self-pay

## 2021-08-20 ENCOUNTER — Ambulatory Visit (INDEPENDENT_AMBULATORY_CARE_PROVIDER_SITE_OTHER): Payer: BC Managed Care – PPO | Admitting: Advanced Practice Midwife

## 2021-08-20 ENCOUNTER — Encounter: Payer: Self-pay | Admitting: Advanced Practice Midwife

## 2021-08-20 VITALS — BP 120/80 | Ht 61.0 in | Wt 204.0 lb

## 2021-08-20 DIAGNOSIS — R102 Pelvic and perineal pain: Secondary | ICD-10-CM

## 2021-08-20 DIAGNOSIS — F331 Major depressive disorder, recurrent, moderate: Secondary | ICD-10-CM

## 2021-08-20 DIAGNOSIS — F411 Generalized anxiety disorder: Secondary | ICD-10-CM

## 2021-08-22 ENCOUNTER — Encounter: Payer: Self-pay | Admitting: Advanced Practice Midwife

## 2021-08-22 NOTE — Progress Notes (Signed)
Patient ID: Katherine Ruiz, female   DOB: Jan 09, 1991, 30 y.o.   MRN: [id number]  Reason for Consult: Vaginal Discharge   Subjective:  Date of Service: 08/20/2021  HPI:   Katherine Ruiz is a 30 y.o. female being seen for severe pelvic cramping and passage of half dollar size tissue-like material last night. She is still having pelvic and low back pain and she feels "weak". She was seen for annual exam by Dr Tiburcio Pea last month and she is scheduled for gyn u/s next week due to post coital bleeding. Last PAP smear was normal in 2021. We discussed comfort measures, resting today, ibuprofen/heating pad for cramping.   Past Medical History:  Diagnosis Date   Asthma    Chickenpox    Depression    Family history of breast cancer    4/21 cancer genetic testing letter sent   History of UTI    Kidney stone    MRSA (methicillin resistant staph aureus) culture positive    Family History  Problem Relation Age of Onset   Arthritis Father    Hyperlipidemia Father    Hypertension Father    Mental illness Father    Diabetes Father    Hyperlipidemia Maternal Grandmother    Arthritis Maternal Grandmother    Breast cancer Maternal Grandmother    Hypertension Maternal Grandmother    Mental illness Maternal Grandmother    Diabetes Maternal Grandmother    Cancer Maternal Grandmother 57       Breast   Cancer - Other Maternal Grandmother        bile duct   Arthritis Maternal Grandfather    Mental illness Paternal Grandmother    Hypertension Paternal Grandmother    Arthritis Paternal Grandmother    Arthritis Paternal Grandfather    Mental illness Paternal Grandfather    Hypertension Paternal Grandfather    Past Surgical History:  Procedure Laterality Date   FOOT SURGERY Left 2014    Short Social History:  Social History   Tobacco Use   Smoking status: Former   Smokeless tobacco: Never  Substance Use Topics   Alcohol use: No    Alcohol/week: 0.0 standard drinks    Allergies   Allergen Reactions   Lamotrigine Hives   Vancomycin Swelling   Septra [Sulfamethoxazole-Trimethoprim] Rash    Current Outpatient Medications  Medication Sig Dispense Refill   albuterol (VENTOLIN HFA) 108 (90 Base) MCG/ACT inhaler Inhale 2 puffs into the lungs every 6 (six) hours as needed for shortness of breath. 1 each 0   Ascorbic Acid (VITAMIN C) 1000 MG tablet Take 1,000 mg by mouth daily.     citalopram (CELEXA) 40 MG tablet Take 1 tablet (40 mg total) by mouth daily. For anxiety and depression. Due for office visit in late November. 90 tablet 0   etonogestrel-ethinyl estradiol (NUVARING) 0.12-0.015 MG/24HR vaginal ring Insert vaginally and leave in place for 3 consecutive weeks (or for 24 days), then remove for 1 week (or for 4 days). 3 each 3   neomycin-polymyxin-hydrocortisone (CORTISPORIN) OTIC solution Place 3 drops into the right ear 3 (three) times daily. 10 mL 0   Probiotic Product (PROBIOTIC DAILY PO) Take by mouth daily.     No current facility-administered medications for this visit.    Review of Systems  Constitutional:  Positive for malaise/fatigue. Negative for chills and fever.  HENT:  Negative for congestion, ear discharge, ear pain, hearing loss, sinus pain and sore throat.   Eyes:  Negative for blurred vision and  double vision.  Respiratory:  Negative for cough, shortness of breath and wheezing.   Cardiovascular:  Negative for chest pain, palpitations and leg swelling.  Gastrointestinal:  Positive for abdominal pain. Negative for blood in stool, constipation, diarrhea, heartburn, melena, nausea and vomiting.  Genitourinary:  Negative for dysuria, flank pain, frequency, hematuria and urgency.       Positive for passage of tissue-like material  Musculoskeletal:  Positive for back pain. Negative for joint pain and myalgias.  Skin:  Negative for itching and rash.  Neurological:  Negative for dizziness, tingling, tremors, sensory change, speech change, focal weakness,  seizures, loss of consciousness, weakness and headaches.  Endo/Heme/Allergies:  Negative for environmental allergies. Does not bruise/bleed easily.  Psychiatric/Behavioral:  Negative for depression, hallucinations, memory loss, substance abuse and suicidal ideas. The patient is not nervous/anxious and does not have insomnia.        Objective:  Objective   Vitals:   08/20/21 1521  BP: 120/80  Weight: 204 lb (92.5 kg)  Height: 5\' 1"  (1.549 m)   Body mass index is 38.55 kg/m. Constitutional: Well nourished, well developed female in no acute distress.  HEENT: normal Extremity:  no edema   Respiratory:  Normal respiratory effort Abdomen: soft, nontender, nondistended Back: no CVAT Psych: Alert and Oriented x3. No memory deficits. Normal mood and affect.  MS: normal gait, normal bilateral lower extremity ROM/strength/stability.  Pelvic exam:  is not limited by body habitus EGBUS: within normal limits Vagina: within normal limits and with normal mucosa, scant blood in the vault Cervix: no active bleeding    Assessment/Plan:     30 y.o. G2 P75 female with pelvic and low back pain, vaginal passage of tissue-like material  Follow up with scheduled gyn ultrasound and visit with Dr P60 Rest, ibuprofen, heating pad   Tiburcio Pea CNM [facility] 08/22/2021, 5:17 PM

## 2021-08-24 ENCOUNTER — Ambulatory Visit
Admission: RE | Admit: 2021-08-24 | Discharge: 2021-08-24 | Disposition: A | Discharge status: Home / Self Care | Payer: BC Managed Care – PPO | Source: Ambulatory Visit | Attending: Obstetrics & Gynecology | Admitting: Obstetrics & Gynecology

## 2021-08-24 ENCOUNTER — Other Ambulatory Visit: Payer: Self-pay

## 2021-08-24 DIAGNOSIS — N930 Postcoital and contact bleeding: Secondary | ICD-10-CM | POA: Diagnosis not present

## 2021-08-26 ENCOUNTER — Ambulatory Visit (INDEPENDENT_AMBULATORY_CARE_PROVIDER_SITE_OTHER): Payer: BC Managed Care – PPO | Admitting: Obstetrics & Gynecology

## 2021-08-26 ENCOUNTER — Encounter: Payer: Self-pay | Admitting: Obstetrics & Gynecology

## 2021-08-26 ENCOUNTER — Ambulatory Visit: Payer: BC Managed Care – PPO

## 2021-08-26 DIAGNOSIS — N930 Postcoital and contact bleeding: Secondary | ICD-10-CM

## 2021-08-26 DIAGNOSIS — N926 Irregular menstruation, unspecified: Secondary | ICD-10-CM | POA: Diagnosis not present

## 2021-08-26 DIAGNOSIS — Z3009 Encounter for other general counseling and advice on contraception: Secondary | ICD-10-CM | POA: Diagnosis not present

## 2021-08-26 NOTE — Progress Notes (Signed)
Virtual Visit via Telephone Note  I connected with Katherine Ruiz on 08/26/21 at  2:30 PM EDT by telephone and verified that I am speaking with the correct person using two identifiers.  Location: Patient: Home Provider: Office   I discussed the limitations, risks, security and privacy concerns of performing an evaluation and management service by telephone and the availability of in person appointments. I also discussed with the patient that there may be a patient responsible charge related to this service. The patient expressed understanding and agreed to proceed.   History of Present Illness:  Pt is a 30 yo G2P2 with irreg cycles, post coital bleeding, which are significant bothers to her and her routine.  Nuvaring no help but good birth control for her.  OCP, got pregnant.  Depo, side effects.  IUD, BTB.  Ultrasound demonstrates no masses seen These findings are Pelvis normal  PMHx: She  has a past medical history of Asthma, Chickenpox, Depression, Family history of breast cancer, History of UTI, Kidney stone, and MRSA (methicillin resistant staph aureus) culture positive. Also,  has a past surgical history that includes Foot surgery (Left, 2014)., family history includes Arthritis in her father, maternal grandfather, maternal grandmother, paternal grandfather, and paternal grandmother; Breast cancer in her maternal grandmother; Cancer (age of onset: 29) in her maternal grandmother; Cancer - Other in her maternal grandmother; Diabetes in her father and maternal grandmother; Hyperlipidemia in her father and maternal grandmother; Hypertension in her father, maternal grandmother, paternal grandfather, and paternal grandmother; Mental illness in her father, maternal grandmother, paternal grandfather, and paternal grandmother.,  reports that she has quit smoking. She has never used smokeless tobacco. She reports that she does not drink alcohol and does not use drugs.  She has a current  medication list which includes the following prescription(s): albuterol, vitamin c, citalopram, etonogestrel-ethinyl estradiol, neomycin-polymyxin-hydrocortisone, and probiotic product. Also, is allergic to lamotrigine, vancomycin, and septra [sulfamethoxazole-trimethoprim].  Review of Systems  All other systems reviewed and are negative.  US PELVIC COMPLETE WITH TRANSVAGINAL  Result Date: 08/25/2021 CLINICAL DATA:  Post coital bleeding, irregular menses, has NuvaRing EXAM: TRANSABDOMINAL AND TRANSVAGINAL ULTRASOUND OF PELVIS TECHNIQUE: Both transabdominal and transvaginal ultrasound examinations of the pelvis were performed. Transabdominal technique was performed for global imaging of the pelvis including uterus, ovaries, adnexal regions, and pelvic cul-de-sac. It was necessary to proceed with endovaginal exam following the transabdominal exam to visualize the uterus, endometrium, and ovaries. COMPARISON:  None FINDINGS: Uterus Measurements: 7.8 x 3.6 x 4.6 cm = volume: 67 mL. Anteverted. Normal morphology without mass Endometrium Thickness: 2 mm.  No endometrial fluid or mass Right ovary Measurements: 3.0 x 1.6 x 2.9 cm (volume = 7.3 cm^3) = volume: 7.3 mL. Corpus luteum present; no follow-up imaging recommended. Otherwise unremarkable. Left ovary Measurements: 2.7 x 1.2 x 1.4 cm = volume: 2.5 mL. Normal morphology without mass Other findings No free pelvic fluid.  No adnexal masses. IMPRESSION: No pelvic sonographic abnormalities. Electronically Signed   By: Ulyses Southward M.D.   On: 08/25/2021 15:48     Observations/Objective: No exam today, due to telephone eVisit due to [facility] virus restriction on elective visits and procedures.  Prior visits reviewed along with ultrasounds/labs as indicated.  Assessment and Plan:   ICD-10-CM   1. Irregular bleeding  N92.6     2. PCB (post coital bleeding)  N93.0     3. Sterilization consult  Z30.09      Follow Up Instructions: Options discussed. Plan lap  BTL (  for contraception) and Hysteroscopy w Ablation (for abnormal bleeding) Alternatives of hormonal therapy or no therapy discussed as well.   I discussed the assessment and treatment plan with the patient. The patient was provided an opportunity to ask questions and all were answered. The patient agreed with the plan and demonstrated an understanding of the instructions.   The patient was advised to call back or seek an in-person evaluation if the symptoms worsen or if the condition fails to improve as anticipated.  I provided 22 minutes of non-face-to-face time during this encounter.   [name], MD

## 2021-08-26 NOTE — Patient Instructions (Addendum)
Endometrial Ablation Endometrial ablation is a procedure that destroys the thin inner layer of the lining of the uterus (endometrium). This procedure may be done: To stop heavy menstrual periods. To stop bleeding that is causing anemia. To control irregular bleeding. To treat bleeding caused by small tumors (fibroids) in the endometrium. This procedure is often done as an alternative to major surgery, such as removal of the uterus and cervix (hysterectomy). As a result of this procedure: You may not be able to have children. However, if you have not yet gone through menopause: You may still have a small chance of getting pregnant. You will need to use a reliable method of birth control after the procedure to prevent pregnancy. You may stop having a menstrual period, or you may have only a small amount of bleeding during your period. Menstruation may return several years after the procedure. Tell a health care provider about: Any allergies you have. All medicines you are taking, including vitamins, herbs, eye drops, creams, and over-the-counter medicines. Any problems you or family members have had with the use of anesthetic medicines. Any blood disorders you have. Any surgeries you have had. Any medical conditions you have. Whether you are pregnant or may be pregnant. What are the risks? Generally, this is a safe procedure. However, problems may occur, including: A hole (perforation) in the uterus or bowel. Infection in the uterus, bladder, or vagina. Bleeding. Allergic reaction to medicines. Damage to nearby structures or organs. An air bubble in the lung (air embolus). Problems with pregnancy. Failure of the procedure. Decreased ability to diagnose cancer in the endometrium. Scar tissue forms after the procedure, making it more difficult to get a sample of the uterine lining. What happens before the procedure? Medicines Ask your health care provider about: Changing or stopping your  regular medicines. This is especially important if you take diabetes medicines or blood thinners. Taking medicines such as aspirin and ibuprofen. These medicines can thin your blood. Do not take these medicines before your procedure if your doctor tells you not to take them. Taking over-the-counter medicines, vitamins, herbs, and supplements. Tests You will have tests of your endometrium to make sure there are no precancerous cells or cancer cells present. You may have an ultrasound of the uterus. General instructions Do not use any products that contain nicotine or tobacco for at least 4 weeks before the procedure. These include cigarettes, chewing tobacco, and vaping devices, such as e-cigarettes. If you need help quitting, ask your health care provider. You may be given medicines to thin the endometrium. Ask your health care provider what steps will be taken to help prevent infection. These steps may include: Removing hair at the surgery site. Washing skin with a germ-killing soap. Taking antibiotic medicine. Plan to have a responsible adult take you home from the hospital or clinic. Plan to have a responsible adult care for you for the time you are told after you leave the hospital or clinic. This is important. What happens during the procedure?  You will lie on an exam table with your feet and legs supported as in a pelvic exam. An IV will be inserted into one of your veins. You will be given a medicine to help you relax (sedative). A surgical tool with a light and camera (resectoscope) will be inserted into your vagina and moved into your uterus. This allows your surgeon to see inside your uterus. Endometrial tissue will be destroyed and removed, using one of the following methods: Radiofrequency. This   uses an electrical current to destroy the endometrium. Cryotherapy. This uses extreme cold to freeze the endometrium. Heated fluid. This uses a heated salt and water (saline) solution to  destroy the endometrium. Microwave. This uses high-energy microwaves to heat up the endometrium and destroy it. Thermal balloon. This involves inserting a catheter with a balloon tip into the uterus. The balloon tip is filled with heated fluid to destroy the endometrium. The procedure may vary among health care providers and hospitals. What happens after the procedure? Your blood pressure, heart rate, breathing rate, and blood oxygen level will be monitored until you leave the hospital or clinic. You may have vaginal bleeding for 4-6 weeks after the procedure. You may also have: Cramps. A thin, watery vaginal discharge that is light pink or brown. A need to urinate more than usual. Nausea. If you were given a sedative during the procedure, it can affect you for several hours. Do not drive or operate machinery until your health care provider says that it is safe. Do not have sex or insert anything into your vagina until your health care provider says it is safe. Summary Endometrial ablation is done to treat many causes of heavy menstrual bleeding. The procedure destroys the thin inner layer of the lining of the uterus (endometrium). This procedure is often done as an alternative to major surgery, such as removal of the uterus and cervix (hysterectomy). Plan to have a responsible adult take you home from the hospital or clinic. This information is not intended to replace advice given to you by your health care provider. Make sure you discuss any questions you have with your health care provider. Document Revised: 05/21/2020 Document Reviewed: 05/21/2020 Elsevier Patient Education  2022 Elsevier Inc.  Surgery to Prevent Pregnancy Sterilization is surgery to prevent pregnancy. Sterilization is permanent. It should only be done if you are sure that you do not want to have children. For females, the fallopian tubes are either blocked or closed off. When the fallopian tubes are closed, the eggs that  the ovaries release cannot enter the uterus, sperm cannot reach the eggs, and pregnancy is prevented. For males, the vas deferens is cut and then tied or burned (cauterized). The vas deferens is a tube that carries sperm from the testicles. This procedure prevents pregnancy by blocking sperm from going through the vas deferens and penis during ejaculation. Types of sterilization   For females, the surgeries include: Laparoscopic tubal ligation. In this surgery, the fallopian tubes are tied off, sealed with heat, or blocked with a clip, ring, or clamp. A small portion of each fallopian tube may also be removed. This surgery is done through several small cuts (incisions) with special instruments that are inserted into the abdomen. Postpartum tubal ligation. This is also called a mini-laparotomy. This surgery is done right after childbirth or 1 or 2 days after childbirth. In this surgery, the fallopian tubes are tied off, sealed with heat, or blocked with a clip, ring, or clamp. A small portion of each fallopian tube may also be removed. The surgery is done through a single incision in the abdomen. Tubal ligation during a C-section. In this surgery, the fallopian tubes are tied off, sealed with heat, or blocked with a clip, ring, or clamp. A small portion of each fallopian tube may also be removed. The surgery is done at the same time as a C-section delivery. For males, the surgeries include: Incision vasectomy. In this surgery, one or two small incisions are made in  the scrotum. The vas deferens will be pulled out of the scrotum and cut. The vas deferens will be tied off or sealed with heat and placed back into your scrotum. The incision will be closed with absorbable stitches (sutures). No scalpel vasectomy. In this surgery, a punctured opening is made in the scrotum. The vas deferens will be pulled out of the scrotum and cut. The vas deferens will be tied off or sealed with heat and placed back into your  scrotum. The opening is small and will not require sutures. What are the benefits of sterilization? It is usually effective for a lifetime. The procedures are generally safe. For females, sterilization does not affect the hormones, like other types of birth control. Because of this, menstrual periods will not be affected. For both males and females, sexual desire and sexual performance will not be affected. What are the disadvantages of sterilization? Risks from the surgery Generally, sterilization is safe. Complications are rare. However, there are some risks. They include: Bleeding. Infection. Reaction to medicine used during the procedure. Injury to surrounding organs. Risks after sterilization After a successful surgery, you may have other problems. Female sterilization risks may include: Failure of the procedure. Sterilization is nearly 100% effective, but it can fail. In rare cases, the fallopian tubes can grow back together over time. If this happens, a female will be able to get pregnant again. A higher risk of having an ectopic pregnancy. An ectopic pregnancy is a pregnancy that grows outside of the uterus. This kind of pregnancy can lead to serious bleeding if it is not treated. Female sterilization risks may include: Bleeding and swelling of the scrotum. Failure of the procedure. There is a very small chance that the tied or cauterized ends of the vas deferens may reconnect (recanalization). If this happens, a female could still make a female pregnant. Other risks may include: A risk that you may change your mind and decide you want have children. Sterilization may be reversed, but a reversal is not always successful. Lack of protection against sexually transmitted infections (STIs). What happens during the procedure? The steps of the procedure depend on the type of sterilization you are having. The procedure may vary among health care providers and hospitals. Questions to ask your  health care provider How effective are sterilization procedures? What type of procedure is right for me? Is it possible to reverse the procedure if I change my mind? What can I expect after the procedure? Where to find more information [employer]: RoboDrop[employer].cy [place] Department of Health and Human Services: ConventionalMedicines.si [facility] [employer] [facility]: www.urologyhealth.org Summary Sterilization is surgery to prevent pregnancy. There are different types of sterilization surgeries. Sterilization may be reversed, but a reversal is not always successful. Sterilization does not protect against STIs. This information is not intended to replace advice given to you by your health care provider. Make sure you discuss any questions you have with your health care provider. Document Revised: 08/01/2020 Document Reviewed: 08/01/2020 Elsevier Patient Education  2022 [employer].

## 2021-08-27 ENCOUNTER — Telehealth: Payer: Self-pay

## 2021-08-27 ENCOUNTER — Other Ambulatory Visit: Payer: Self-pay | Admitting: Obstetrics & Gynecology

## 2021-08-27 DIAGNOSIS — N926 Irregular menstruation, unspecified: Secondary | ICD-10-CM

## 2021-08-27 NOTE — Telephone Encounter (Signed)
Called patient to schedule Laparoscopy with Tubal Partial Salingectomy, Hysteroscopy D&C with Endometrial Ablation ([facility]) w Tiburcio Pea  DOS 10/20  H&P 10/19 @ 3:30   Pre-admit phone call appointment to be requested - date and time will be included on H&P paper work. Also all appointments will be updated on pt MyChart. Explained that this appointment has a call window. Based on the time scheduled will indicate if the call will be received within a 4 hour window before 1:00 or after.  Advised that pt may also receive calls from the hospital pharmacy and pre-service center.  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 No secondary insurance.   Vic Ripper is confirmed.

## 2021-08-27 NOTE — Telephone Encounter (Signed)
Left a message for the patient to return the call.  

## 2021-08-27 NOTE — Telephone Encounter (Signed)
-----   Message from Nadara Mustard, MD sent at 08/26/2021  2:53 PM EDT ----- Regarding: Surgery Surgery Booking Request Patient Full Name:  Katherine Ruiz  MRN: [id number]  DOB: 16-Jul-1991  Surgeon: [name], MD  Requested Surgery Date and Time: Prefers 10/25 perhaps; or prior to Thanksgiving holiday Primary Diagnosis AND Code:  ICD-10-CM  1. Irregular bleeding  N92.6   2. PCB (post coital bleeding)  N93.0   3. Sterilization consult  Z30.09   Secondary Diagnosis and Code:  Surgical Procedure: Laparoscopy with Tubal Partial Salingectomy, Hysteroscopy D&C with Endometrial Ablation ([facility]) RNFA Requested?: No L&D Notification: No Admission Status: same day surgery Length of Surgery: 35 min Special Case Needs: Yes, [facility] H&P: Yes Phone Interview???:  Yes Interpreter: No Medical Clearance:  No Special Scheduling Instructions: No Any known health/anesthesia issues, diabetes, sleep apnea, latex allergy, defibrillator/pacemaker?: No Acuity: P3   (P1 highest, P2 delay may cause harm, P3 low, elective gyn, P4 lowest) Post op follow up visits: yes

## 2021-08-31 ENCOUNTER — Encounter
Admission: RE | Admit: 2021-08-31 | Discharge: 2021-08-31 | Disposition: A | Discharge status: Home / Self Care | Payer: BC Managed Care – PPO | Source: Ambulatory Visit | Attending: Obstetrics & Gynecology | Admitting: Obstetrics & Gynecology

## 2021-08-31 ENCOUNTER — Other Ambulatory Visit: Payer: Self-pay

## 2021-08-31 HISTORY — DX: Personal history of urinary calculi: Z87.442

## 2021-08-31 HISTORY — DX: Unspecified osteoarthritis, unspecified site: M19.90

## 2021-08-31 HISTORY — DX: Anxiety disorder, unspecified: F41.9

## 2021-08-31 NOTE — Patient Instructions (Addendum)
Your procedure is scheduled on: Thurs 10/20 Report to Registration desk in [facility] To find out your arrival time please call [phone] between 1PM - 3PM on Wed. 10/19  Remember: Instructions that are not followed completely may result in serious medical risk,  up to and including death, or upon the discretion of your surgeon and anesthesiologist your  surgery may need to be rescheduled.     _X__ 1. Do not eat food after midnight the night before your procedure.                 No chewing gum or hard candies. You may drink clear liquids up to 2 hours                 before you are scheduled to arrive for your surgery- DO not drink clear                 liquids within 2 hours of the start of your surgery.                 Clear Liquids include:  water, apple juice without pulp, clear Gatorade, G2 or                  Gatorade Zero (avoid Red/Purple/Blue), Black Coffee or Tea (Do not add                 anything to coffee or tea). __X__2.  On the morning of surgery brush your teeth with toothpaste and water, you                may rinse your mouth with mouthwash if you wish.  Do not swallow any toothpaste of mouthwash.     _X__ 3.  No Alcohol for 24 hours before or after surgery.   __ 4.  Do Not Smoke or use e-cigarettes For 24 Hours Prior to Your Surgery.                 Do not use any chewable tobacco products for at least 6 hours prior to                 Surgery.  ___  5.  Do not use any recreational drugs (marijuana, cocaine, heroin, ecstasy,       MDMA or other) For at least one week prior to your surgery.            Combination of these drugs with anesthesia may have life threatening       results.  ____  6.  Bring all medications with you on the day of surgery if instructed.   ___x_  7.  Notify your doctor if there is any change in your medical condition      (cold, fever, infections).     Do not wear jewelry, make-up, hairpins, clips or nail  polish. Do not wear lotions, powders, or perfumes. You may wear deodorant. Do not shave 48 hours prior to surgery.  Do not bring valuables to the hospital.    [facility] is not responsible for any belongings or valuables.  Contacts, dentures or bridgework may not be worn into surgery. Leave your suitcase in the car. After surgery it may be brought to your room. For patients admitted to the hospital, discharge time is determined by your treatment team.   Patients discharged the day of surgery will not be allowed to drive home.   Make arrangements for someone to be with  you for the first 24 hours of your Same Day Discharge.    Please read over the following fact sheets that you were given: incentive spirometry     __x__ Take these medicines the morning of surgery with A SIP OF WATER:    1.acetaminophen (TYLENOL) 500 MG tablet if needed  2. cetirizine (ZYRTEC) 10 MG tablet if needed  3. citalopram (CELEXA) 40 MG tablet  4.  5.  6.  ____ Fleet Enema (as directed)   ___x_ Shower the night before and morning of surgery with your usual products  ____ Use Benzoyl Peroxide Gel as instructed  __x__ Use inhalers on the day of surgery albuterol (VENTOLIN HFA) 108 (90 Base) MCG/ACT inhaler and bring with you  ____ Stop metformin 2 days prior to surgery    ____ Take 1/2 of usual insulin dose the night before surgery. No insulin the morning          of surgery.   ____ Stop Coumadin/Plavix/aspirin on   __x__ Stop Anti-inflammatories ibuprofen (ADVIL) 200 MG tablet today   __x__ Stop supplements until after surgery.  Ascorbic Acid (VITAMIN C) 1000 MG tablet  ____ Bring C-Pap to the hospital.    If you have any questions regarding your pre-procedure instructions,  Please call Pre-admit Testing at [phone]

## 2021-09-01 ENCOUNTER — Encounter: Payer: Self-pay | Admitting: Obstetrics & Gynecology

## 2021-09-01 ENCOUNTER — Ambulatory Visit (INDEPENDENT_AMBULATORY_CARE_PROVIDER_SITE_OTHER): Payer: BC Managed Care – PPO | Admitting: Obstetrics & Gynecology

## 2021-09-01 VITALS — BP 126/80 | Ht 61.0 in | Wt 201.0 lb

## 2021-09-01 DIAGNOSIS — N926 Irregular menstruation, unspecified: Secondary | ICD-10-CM

## 2021-09-01 DIAGNOSIS — N930 Postcoital and contact bleeding: Secondary | ICD-10-CM

## 2021-09-01 DIAGNOSIS — Z3009 Encounter for other general counseling and advice on contraception: Secondary | ICD-10-CM

## 2021-09-01 NOTE — Progress Notes (Signed)
   PRE-OPERATIVE HISTORY AND PHYSICAL EXAM  HPI:  Katherine Ruiz is a 30 y.o. G2P2002 No LMP recorded. (Menstrual status: Other).; she is being admitted for surgery related to abnormal uterine bleeding and requested sterilization.   Despite Nuvaring and other past hormonal regimens, she has irreg bleeding as well as post coital bleeding.  Associated w lower abdominal pains as well.  Ultrasound normal uterus without fibroid  PMHx: Past Medical History:  Diagnosis Date   Anxiety    Arthritis    Asthma    Chickenpox    Depression    Family history of breast cancer    4/21 cancer genetic testing letter sent   History of kidney stones    History of UTI    MRSA (methicillin resistant staph aureus) culture positive 2004   spider bite   Past Surgical History:  Procedure Laterality Date   FOOT SURGERY Left 11/14/2012   screw in foot   WOUND DEBRIDEMENT Right 12/2012   inner thigh with drain ( spider bite MRSA)   Family History  Problem Relation Age of Onset   Arthritis Father    Hyperlipidemia Father    Hypertension Father    Mental illness Father    Diabetes Father    Hyperlipidemia Maternal Grandmother    Arthritis Maternal Grandmother    Breast cancer Maternal Grandmother    Hypertension Maternal Grandmother    Mental illness Maternal Grandmother    Diabetes Maternal Grandmother    Cancer Maternal Grandmother 50       Breast   Cancer - Other Maternal Grandmother        bile duct   Arthritis Maternal Grandfather    Mental illness Paternal Grandmother    Hypertension Paternal Grandmother    Arthritis Paternal Grandmother    Arthritis Paternal Grandfather    Mental illness Paternal Grandfather    Hypertension Paternal Grandfather    Social History   Tobacco Use   Smoking status: Former    Types: Cigarettes    Quit date: 10/04/2012    Years since quitting: 8.9   Smokeless tobacco: Never  Vaping Use   Vaping Use: Never used  Substance Use Topics   Alcohol use:  Yes    Comment: occasionally   Drug use: No    Current Outpatient Medications:    acetaminophen (TYLENOL) 500 MG tablet, Take 1,000 mg by mouth every 6 (six) hours as needed (for pain)., Disp: , Rfl:    albuterol (VENTOLIN HFA) 108 (90 Base) MCG/ACT inhaler, Inhale 2 puffs into the lungs every 6 (six) hours as needed for shortness of breath., Disp: 1 each, Rfl: 0   Ascorbic Acid (VITAMIN C) 1000 MG tablet, Take 1,000 mg by mouth daily as needed (immune system support (cold/flu seasons))., Disp: , Rfl:    cetirizine (ZYRTEC) 10 MG tablet, Take 10 mg by mouth in the morning., Disp: , Rfl:    citalopram (CELEXA) 40 MG tablet, Take 1 tablet (40 mg total) by mouth daily. For anxiety and depression. Due for office visit in late November., Disp: 90 tablet, Rfl: 0   ibuprofen (ADVIL) 200 MG tablet, Take 400 mg by mouth every 8 (eight) hours as needed (pain.)., Disp: , Rfl:  Allergies: Vancomycin, Lamotrigine, and Septra [sulfamethoxazole-trimethoprim]  Review of Systems  Constitutional:  Negative for chills, fever and malaise/fatigue.  HENT:  Negative for congestion, sinus pain and sore throat.   Eyes:  Negative for blurred vision and pain.  Respiratory:  Negative for cough and   wheezing.   Cardiovascular:  Negative for chest pain and leg swelling.  Gastrointestinal:  Negative for abdominal pain, constipation, diarrhea, heartburn, nausea and vomiting.  Genitourinary:  Negative for dysuria, frequency, hematuria and urgency.  Musculoskeletal:  Negative for back pain, joint pain, myalgias and neck pain.  Skin:  Negative for itching and rash.  Neurological:  Negative for dizziness, tremors and weakness.  Endo/Heme/Allergies:  Does not bruise/bleed easily.  Psychiatric/Behavioral:  Negative for depression. The patient is not nervous/anxious and does not have insomnia.    Objective: BP 126/80   Ht 5\' 1"  (1.549 m)   Wt 201 lb (91.2 kg)   BMI 37.98 kg/m   Filed Weights   09/01/21 1529  Weight: 201  lb (91.2 kg)   Physical Exam Constitutional:      General: She is not in acute distress.    Appearance: She is well-developed.  HENT:     Head: Normocephalic and atraumatic. No laceration.     Right Ear: Hearing normal.     Left Ear: Hearing normal.     Mouth/Throat:     Pharynx: Uvula midline.  Eyes:     Pupils: Pupils are equal, round, and reactive to light.  Neck:     Thyroid: No thyromegaly.  Cardiovascular:     Rate and Rhythm: Normal rate and regular rhythm.     Heart sounds: No murmur heard.   No friction rub. No gallop.  Pulmonary:     Effort: Pulmonary effort is normal. No respiratory distress.     Breath sounds: Normal breath sounds. No wheezing.  Abdominal:     General: Bowel sounds are normal. There is no distension.     Palpations: Abdomen is soft.     Tenderness: There is no abdominal tenderness. There is no rebound.  Musculoskeletal:        General: Normal range of motion.     Cervical back: Normal range of motion and neck supple.  Neurological:     Mental Status: She is alert and oriented to person, place, and time.     Cranial Nerves: No cranial nerve deficit.  Skin:    General: Skin is warm and dry.  Psychiatric:        Judgment: Judgment normal.  Vitals reviewed.    Assessment: 1. Irregular bleeding   2. PCB (post coital bleeding)   3. Sterilization consult   Plan Lap Bilat Partial Salpingectomy Also hysteroscopy D&C Endometrial Ablation  I have had a careful discussion with this patient about all the options available and the risk/benefits of each. I have fully informed this patient that surgery may subject her to a variety of discomforts and risks: She understands that most patients have surgery with little difficulty, but problems can happen ranging from minor to fatal. These include nausea, vomiting, pain, bleeding, infection, poor healing, hernia, or formation of adhesions. Unexpected reactions may occur from any drug or anesthetic given.  Unintended injury may occur to other pelvic or abdominal structures such as Fallopian tubes, ovaries, bladder, ureter (tube from kidney to bladder), or bowel. Nerves going from the pelvis to the legs may be injured. Any such injury may require immediate or later additional surgery to correct the problem. Excessive blood loss requiring transfusion is very unlikely but possible. Dangerous blood clots may form in the legs or lungs. Physical and sexual activity will be restricted in varying degrees for an indeterminate period of time but most often 2-6 weeks.  Finally, she understands that it is impossible  to list every possible undesirable effect and that the condition for which surgery is done is not always cured or significantly improved, and in rare cases may be even worse.Ample time was given to answer all questions.  Paul Nashia Remus, MD, FACOG [facility], [facility] 09/01/2021  3:55 PM  

## 2021-09-01 NOTE — H&P (View-Only) (Signed)
PRE-OPERATIVE HISTORY AND PHYSICAL EXAM  HPI:  Katherine Ruiz is a 30 y.o. [id number] No LMP recorded. (Menstrual status: Other).; she is being admitted for surgery related to abnormal uterine bleeding and requested sterilization.   Despite Nuvaring and other past hormonal regimens, she has irreg bleeding as well as post coital bleeding.  Associated w lower abdominal pains as well.  Ultrasound normal uterus without fibroid  PMHx: Past Medical History:  Diagnosis Date   Anxiety    Arthritis    Asthma    Chickenpox    Depression    Family history of breast cancer    4/21 cancer genetic testing letter sent   History of kidney stones    History of UTI    MRSA (methicillin resistant staph aureus) culture positive 2004   spider bite   Past Surgical History:  Procedure Laterality Date   FOOT SURGERY Left 11/14/2012   screw in foot   WOUND DEBRIDEMENT Right 12/2012   inner thigh with drain ( spider bite MRSA)   Family History  Problem Relation Age of Onset   Arthritis Father    Hyperlipidemia Father    Hypertension Father    Mental illness Father    Diabetes Father    Hyperlipidemia Maternal Grandmother    Arthritis Maternal Grandmother    Breast cancer Maternal Grandmother    Hypertension Maternal Grandmother    Mental illness Maternal Grandmother    Diabetes Maternal Grandmother    Cancer Maternal Grandmother 35       Breast   Cancer - Other Maternal Grandmother        bile duct   Arthritis Maternal Grandfather    Mental illness Paternal Grandmother    Hypertension Paternal Grandmother    Arthritis Paternal Grandmother    Arthritis Paternal Grandfather    Mental illness Paternal Grandfather    Hypertension Paternal Grandfather    Social History   Tobacco Use   Smoking status: Former    Types: Cigarettes    Quit date: 10/04/2012    Years since quitting: 8.9   Smokeless tobacco: Never  Vaping Use   Vaping Use: Never used  Substance Use Topics   Alcohol use:  Yes    Comment: occasionally   Drug use: No    Current Outpatient Medications:    acetaminophen (TYLENOL) 500 MG tablet, Take 1,000 mg by mouth every 6 (six) hours as needed (for pain)., Disp: , Rfl:    albuterol (VENTOLIN HFA) 108 (90 Base) MCG/ACT inhaler, Inhale 2 puffs into the lungs every 6 (six) hours as needed for shortness of breath., Disp: 1 each, Rfl: 0   Ascorbic Acid (VITAMIN C) 1000 MG tablet, Take 1,000 mg by mouth daily as needed (immune system support (cold/flu seasons))., Disp: , Rfl:    cetirizine (ZYRTEC) 10 MG tablet, Take 10 mg by mouth in the morning., Disp: , Rfl:    citalopram (CELEXA) 40 MG tablet, Take 1 tablet (40 mg total) by mouth daily. For anxiety and depression. Due for office visit in late November., Disp: 90 tablet, Rfl: 0   ibuprofen (ADVIL) 200 MG tablet, Take 400 mg by mouth every 8 (eight) hours as needed (pain.)., Disp: , Rfl:  Allergies: Vancomycin, Lamotrigine, and Septra [sulfamethoxazole-trimethoprim]  Review of Systems  Constitutional:  Negative for chills, fever and malaise/fatigue.  HENT:  Negative for congestion, sinus pain and sore throat.   Eyes:  Negative for blurred vision and pain.  Respiratory:  Negative for cough and  wheezing.   Cardiovascular:  Negative for chest pain and leg swelling.  Gastrointestinal:  Negative for abdominal pain, constipation, diarrhea, heartburn, nausea and vomiting.  Genitourinary:  Negative for dysuria, frequency, hematuria and urgency.  Musculoskeletal:  Negative for back pain, joint pain, myalgias and neck pain.  Skin:  Negative for itching and rash.  Neurological:  Negative for dizziness, tremors and weakness.  Endo/Heme/Allergies:  Does not bruise/bleed easily.  Psychiatric/Behavioral:  Negative for depression. The patient is not nervous/anxious and does not have insomnia.    Objective: BP 126/80   Ht 5\' 1"  (1.549 m)   Wt 201 lb (91.2 kg)   BMI 37.98 kg/m   Filed Weights   09/01/21 1529  Weight: 201  lb (91.2 kg)   Physical Exam Constitutional:      General: She is not in acute distress.    Appearance: She is well-developed.  HENT:     Head: Normocephalic and atraumatic. No laceration.     Right Ear: Hearing normal.     Left Ear: Hearing normal.     Mouth/Throat:     Pharynx: Uvula midline.  Eyes:     Pupils: Pupils are equal, round, and reactive to light.  Neck:     Thyroid: No thyromegaly.  Cardiovascular:     Rate and Rhythm: Normal rate and regular rhythm.     Heart sounds: No murmur heard.   No friction rub. No gallop.  Pulmonary:     Effort: Pulmonary effort is normal. No respiratory distress.     Breath sounds: Normal breath sounds. No wheezing.  Abdominal:     General: Bowel sounds are normal. There is no distension.     Palpations: Abdomen is soft.     Tenderness: There is no abdominal tenderness. There is no rebound.  Musculoskeletal:        General: Normal range of motion.     Cervical back: Normal range of motion and neck supple.  Neurological:     Mental Status: She is alert and oriented to person, place, and time.     Cranial Nerves: No cranial nerve deficit.  Skin:    General: Skin is warm and dry.  Psychiatric:        Judgment: Judgment normal.  Vitals reviewed.    Assessment: 1. Irregular bleeding   2. PCB (post coital bleeding)   3. Sterilization consult   Plan Lap Bilat Partial Salpingectomy Also hysteroscopy D&C Endometrial Ablation  I have had a careful discussion with this patient about all the options available and the risk/benefits of each. I have fully informed this patient that surgery may subject her to a variety of discomforts and risks: She understands that most patients have surgery with little difficulty, but problems can happen ranging from minor to fatal. These include nausea, vomiting, pain, bleeding, infection, poor healing, hernia, or formation of adhesions. Unexpected reactions may occur from any drug or anesthetic given.  Unintended injury may occur to other pelvic or abdominal structures such as Fallopian tubes, ovaries, bladder, ureter (tube from kidney to bladder), or bowel. Nerves going from the pelvis to the legs may be injured. Any such injury may require immediate or later additional surgery to correct the problem. Excessive blood loss requiring transfusion is very unlikely but possible. Dangerous blood clots may form in the legs or lungs. Physical and sexual activity will be restricted in varying degrees for an indeterminate period of time but most often 2-6 weeks.  Finally, she understands that it is impossible  to list every possible undesirable effect and that the condition for which surgery is done is not always cured or significantly improved, and in rare cases may be even worse.Ample time was given to answer all questions.  Annamarie Major, MD, [facility], [facility] 09/01/2021  3:55 PM

## 2021-09-01 NOTE — Patient Instructions (Signed)
Endometrial Ablation, Care After ?The following information offers guidance on how to care for yourself after your procedure. Your health care provider may also give you more specific instructions. If you have problems or questions, contact your health care provider. ?What can I expect after the procedure? ?After the procedure, it is common to have: ?A need to urinate more often than usual for the first 24 hours. ?Cramps that feel like menstrual cramps. These may last for 1-2 days. ?A thin, watery vaginal discharge that is light pink or brown. This may last for a few weeks. Discharge will be heavy for the first few days after your procedure. You may need to wear a sanitary pad. ?Nausea. ?Vaginal bleeding for 4-6 weeks after the procedure, as tissue healing occurs. ?Follow these instructions at home: ?Medicines ? ?Take over-the-counter and prescription medicines only as told by your health care provider. ?If you were prescribed an antibiotic medicine, take it as told by your health care provider. Do not stop taking the antibiotic even if you start to feel better. ?Ask your health care provider if the medicine prescribed to you: ?Requires you to avoid driving or using machinery. ?Can cause constipation. You may need to take these actions to prevent or treat constipation: ?Drink enough fluid to keep your urine pale yellow. ?Take over-the-counter or prescription medicines. ?Eat foods that are high in fiber, such as beans, whole grains, and fresh fruits and vegetables. ?Limit foods that are high in fat and processed sugars, such as fried or sweet foods. ?Activity ?If you were given a sedative during the procedure, it can affect you for several hours. Do not drive or operate machinery until your health care provider says that it is safe. ?Do not have sex or put anything into your vagina until your health care provider says that it is safe. ?Do not lift anything that is heavier than 5 lb (2.3 kg), or the limit that you are  told, until your health care provider says that it is safe. ?Return to your normal activities as told by your health care provider. Ask your health care provider what activities are safe for you. ?General instructions ?Do not take baths, swim, or use a hot tub until your health care provider says that it is safe. You will be able to take showers. ?Check your vaginal area every day for signs of infection. Check for: ?Redness, swelling, or more pain. ?More blood coming from your vagina. ?A bad-smelling discharge. ?Keep all follow-up visits. This is important. ?Contact a health care provider if: ?You have vaginal redness, swelling, or more pain. ?You have discharge or bleeding from your vagina that is getting worse. ?You have a bad-smelling vaginal discharge. ?You have a fever or chills. ?You have trouble urinating. ?Get help right away if: ?You have heavy, bright red vaginal bleeding that may include blood clots. ?You have severe cramps that do not get better with medicine. ?Summary ?After endometrial ablation, it is normal to have a thin, watery vaginal discharge that is light pink or brown. This may last a few weeks and may be heavier right after the procedure. ?Vaginal bleeding is common after the procedure and should get better with time. ?Check your vaginal area every day for signs of infection, such as a bad-smelling discharge. ?Keep all follow-up visits. This is important. ?This information is not intended to replace advice given to you by your health care provider. Make sure you discuss any questions you have with your health care provider. ?Document Revised: 05/21/2020   Document Reviewed: 05/21/2020 ?Elsevier Patient Education ? 2022 Elsevier Inc[employer] ? ?

## 2021-09-02 ENCOUNTER — Ambulatory Visit: Payer: BC Managed Care – PPO | Admitting: Anesthesiology

## 2021-09-02 ENCOUNTER — Ambulatory Visit
Admission: RE | Admit: 2021-09-02 | Discharge: 2021-09-02 | Disposition: A | Discharge status: Home / Self Care | Payer: BC Managed Care – PPO | Attending: Obstetrics & Gynecology | Admitting: Obstetrics & Gynecology

## 2021-09-02 ENCOUNTER — Encounter: Payer: Self-pay | Admitting: Obstetrics & Gynecology

## 2021-09-02 ENCOUNTER — Encounter
Admission: RE | Disposition: A | Discharge status: Home / Self Care | Payer: Self-pay | Source: Home / Self Care | Attending: Obstetrics & Gynecology

## 2021-09-02 ENCOUNTER — Other Ambulatory Visit: Payer: Self-pay

## 2021-09-02 DIAGNOSIS — E669 Obesity, unspecified: Secondary | ICD-10-CM | POA: Insufficient documentation

## 2021-09-02 DIAGNOSIS — Z881 Allergy status to other antibiotic agents status: Secondary | ICD-10-CM | POA: Diagnosis not present

## 2021-09-02 DIAGNOSIS — Z882 Allergy status to sulfonamides status: Secondary | ICD-10-CM | POA: Insufficient documentation

## 2021-09-02 DIAGNOSIS — Z87440 Personal history of urinary (tract) infections: Secondary | ICD-10-CM | POA: Diagnosis not present

## 2021-09-02 DIAGNOSIS — Z8614 Personal history of Methicillin resistant Staphylococcus aureus infection: Secondary | ICD-10-CM | POA: Insufficient documentation

## 2021-09-02 DIAGNOSIS — Z302 Encounter for sterilization: Secondary | ICD-10-CM | POA: Insufficient documentation

## 2021-09-02 DIAGNOSIS — Z87891 Personal history of nicotine dependence: Secondary | ICD-10-CM | POA: Diagnosis not present

## 2021-09-02 DIAGNOSIS — J45909 Unspecified asthma, uncomplicated: Secondary | ICD-10-CM | POA: Insufficient documentation

## 2021-09-02 DIAGNOSIS — N920 Excessive and frequent menstruation with regular cycle: Secondary | ICD-10-CM | POA: Insufficient documentation

## 2021-09-02 DIAGNOSIS — Z79899 Other long term (current) drug therapy: Secondary | ICD-10-CM | POA: Diagnosis not present

## 2021-09-02 DIAGNOSIS — Z6837 Body mass index (BMI) 37.0-37.9, adult: Secondary | ICD-10-CM | POA: Diagnosis not present

## 2021-09-02 DIAGNOSIS — N926 Irregular menstruation, unspecified: Secondary | ICD-10-CM

## 2021-09-02 HISTORY — PX: DILATATION AND CURETTAGE/HYSTEROSCOPY WITH MINERVA: SHX6851

## 2021-09-02 HISTORY — PX: LAPAROSCOPIC BILATERAL SALPINGECTOMY: SHX5889

## 2021-09-02 LAB — TYPE AND SCREEN
ABO/RH(D): A POS
Antibody Screen: NEGATIVE

## 2021-09-02 LAB — CBC
HCT: 36.5 % (ref 36.0–46.0)
Hemoglobin: 12.9 g/dL (ref 12.0–15.0)
MCH: 33.6 pg (ref 26.0–34.0)
MCHC: 35.3 g/dL (ref 30.0–36.0)
MCV: 95.1 fL (ref 80.0–100.0)
Platelets: 244 10*3/uL (ref 150–400)
RBC: 3.84 MIL/uL — ABNORMAL LOW (ref 3.87–5.11)
RDW: 11.7 % (ref 11.5–15.5)
WBC: 7.4 10*3/uL (ref 4.0–10.5)
nRBC: 0 % (ref 0.0–0.2)

## 2021-09-02 LAB — POCT PREGNANCY, URINE: Preg Test, Ur: NEGATIVE

## 2021-09-02 SURGERY — SALPINGECTOMY, BILATERAL, LAPAROSCOPIC
Anesthesia: General

## 2021-09-02 MED ORDER — FENTANYL CITRATE (PF) 100 MCG/2ML IJ SOLN
INTRAMUSCULAR | Status: AC
  Filled 2021-09-02: qty 2

## 2021-09-02 MED ORDER — LACTATED RINGERS IV SOLN: INTRAVENOUS | Status: DC

## 2021-09-02 MED ORDER — ONDANSETRON HCL 4 MG/2ML IJ SOLN: 4.0000 mg | Freq: Once | INTRAMUSCULAR | Status: DC | PRN

## 2021-09-02 MED ORDER — GLYCOPYRROLATE 0.2 MG/ML IJ SOLN
INTRAMUSCULAR | Status: DC | PRN
Start: 2021-09-02 — End: 2021-09-02
  Administered 2021-09-02: .2 mg via INTRAVENOUS

## 2021-09-02 MED ORDER — OXYCODONE HCL 5 MG/5ML PO SOLN: 5.0000 mg | Freq: Once | ORAL | Status: AC | PRN

## 2021-09-02 MED ORDER — BUPIVACAINE HCL (PF) 0.5 % IJ SOLN
INTRAMUSCULAR | Status: DC | PRN
  Administered 2021-09-02: 10 mL

## 2021-09-02 MED ORDER — CHLORHEXIDINE GLUCONATE 0.12 % MT SOLN
OROMUCOSAL | Status: AC
  Administered 2021-09-02: 15 mL via OROMUCOSAL
  Filled 2021-09-02: qty 15

## 2021-09-02 MED ORDER — PROPOFOL 10 MG/ML IV BOLUS
INTRAVENOUS | Status: AC
  Filled 2021-09-02: qty 20

## 2021-09-02 MED ORDER — SODIUM CHLORIDE 0.9 % IR SOLN
Status: DC | PRN
  Administered 2021-09-02: 100 mL

## 2021-09-02 MED ORDER — FENTANYL CITRATE (PF) 100 MCG/2ML IJ SOLN
25.0000 ug | INTRAMUSCULAR | Status: DC | PRN
  Administered 2021-09-02: 50 ug via INTRAVENOUS

## 2021-09-02 MED ORDER — SUGAMMADEX SODIUM 200 MG/2ML IV SOLN
INTRAVENOUS | Status: DC | PRN
  Administered 2021-09-02: 200 mg via INTRAVENOUS

## 2021-09-02 MED ORDER — LIDOCAINE HCL (CARDIAC) PF 100 MG/5ML IV SOSY
PREFILLED_SYRINGE | INTRAVENOUS | Status: DC | PRN
  Administered 2021-09-02: 70 mg via INTRAVENOUS

## 2021-09-02 MED ORDER — OXYCODONE-ACETAMINOPHEN 5-325 MG PO TABS
1.0000 | ORAL_TABLET | ORAL | Status: DC | PRN
Start: 2021-09-02 — End: 2021-09-02

## 2021-09-02 MED ORDER — FENTANYL CITRATE (PF) 100 MCG/2ML IJ SOLN
INTRAMUSCULAR | Status: AC
  Administered 2021-09-02: 50 ug via INTRAVENOUS
  Filled 2021-09-02: qty 2

## 2021-09-02 MED ORDER — BUPIVACAINE HCL (PF) 0.5 % IJ SOLN
INTRAMUSCULAR | Status: AC
  Filled 2021-09-02: qty 30

## 2021-09-02 MED ORDER — MIDAZOLAM HCL 2 MG/2ML IJ SOLN
INTRAMUSCULAR | Status: AC
  Filled 2021-09-02: qty 2

## 2021-09-02 MED ORDER — PROPOFOL 10 MG/ML IV BOLUS
INTRAVENOUS | Status: DC | PRN
Start: 2021-09-02 — End: 2021-09-02
  Administered 2021-09-02: 200 mg via INTRAVENOUS

## 2021-09-02 MED ORDER — CHLORHEXIDINE GLUCONATE 0.12 % MT SOLN: 15.0000 mL | Freq: Once | OROMUCOSAL | Status: AC

## 2021-09-02 MED ORDER — FAMOTIDINE 20 MG PO TABS
ORAL_TABLET | ORAL | Status: AC
  Administered 2021-09-02: 20 mg via ORAL
  Filled 2021-09-02: qty 1

## 2021-09-02 MED ORDER — ONDANSETRON HCL 4 MG/2ML IJ SOLN
INTRAMUSCULAR | Status: AC
  Filled 2021-09-02: qty 2

## 2021-09-02 MED ORDER — FAMOTIDINE 20 MG PO TABS: 20.0000 mg | ORAL_TABLET | Freq: Once | ORAL | Status: AC

## 2021-09-02 MED ORDER — ACETAMINOPHEN 325 MG RE SUPP
650.0000 mg | RECTAL | Status: DC | PRN
  Filled 2021-09-02: qty 2

## 2021-09-02 MED ORDER — ACETAMINOPHEN 325 MG PO TABS: 650.0000 mg | ORAL_TABLET | ORAL | Status: DC | PRN

## 2021-09-02 MED ORDER — ALBUTEROL SULFATE HFA 108 (90 BASE) MCG/ACT IN AERS
INHALATION_SPRAY | RESPIRATORY_TRACT | Status: DC | PRN
  Administered 2021-09-02: 2 via RESPIRATORY_TRACT

## 2021-09-02 MED ORDER — ACETAMINOPHEN 10 MG/ML IV SOLN
1000.0000 mg | Freq: Once | INTRAVENOUS | Status: DC | PRN
  Administered 2021-09-02: 1000 mg via INTRAVENOUS

## 2021-09-02 MED ORDER — OXYCODONE-ACETAMINOPHEN 5-325 MG PO TABS: 1.0000 | ORAL_TABLET | ORAL | 0 refills | Status: DC | PRN

## 2021-09-02 MED ORDER — ORAL CARE MOUTH RINSE: 15.0000 mL | Freq: Once | OROMUCOSAL | Status: AC

## 2021-09-02 MED ORDER — ALBUTEROL SULFATE HFA 108 (90 BASE) MCG/ACT IN AERS
INHALATION_SPRAY | RESPIRATORY_TRACT | Status: AC
  Filled 2021-09-02: qty 6.7

## 2021-09-02 MED ORDER — OXYCODONE HCL 5 MG PO TABS
ORAL_TABLET | ORAL | Status: AC
  Filled 2021-09-02: qty 1

## 2021-09-02 MED ORDER — POVIDONE-IODINE 10 % EX SWAB: 2.0000 "application " | Freq: Once | CUTANEOUS | Status: DC

## 2021-09-02 MED ORDER — KETOROLAC TROMETHAMINE 30 MG/ML IJ SOLN
INTRAMUSCULAR | Status: DC | PRN
  Administered 2021-09-02: 30 mg via INTRAVENOUS

## 2021-09-02 MED ORDER — 0.9 % SODIUM CHLORIDE (POUR BTL) OPTIME
TOPICAL | Status: DC | PRN
  Administered 2021-09-02: 100 mL

## 2021-09-02 MED ORDER — MIDAZOLAM HCL 2 MG/2ML IJ SOLN
INTRAMUSCULAR | Status: DC | PRN
  Administered 2021-09-02: 2 mg via INTRAVENOUS

## 2021-09-02 MED ORDER — ROCURONIUM BROMIDE 100 MG/10ML IV SOLN
INTRAVENOUS | Status: DC | PRN
  Administered 2021-09-02: 50 mg via INTRAVENOUS

## 2021-09-02 MED ORDER — FENTANYL CITRATE (PF) 100 MCG/2ML IJ SOLN
INTRAMUSCULAR | Status: DC | PRN
  Administered 2021-09-02: 100 ug via INTRAVENOUS

## 2021-09-02 MED ORDER — DEXAMETHASONE SODIUM PHOSPHATE 10 MG/ML IJ SOLN
INTRAMUSCULAR | Status: AC
  Filled 2021-09-02: qty 1

## 2021-09-02 MED ORDER — ONDANSETRON HCL 4 MG/2ML IJ SOLN
INTRAMUSCULAR | Status: DC | PRN
  Administered 2021-09-02: 4 mg via INTRAVENOUS

## 2021-09-02 MED ORDER — DEXAMETHASONE SODIUM PHOSPHATE 10 MG/ML IJ SOLN
INTRAMUSCULAR | Status: DC | PRN
  Administered 2021-09-02: 10 mg via INTRAVENOUS

## 2021-09-02 MED ORDER — OXYCODONE HCL 5 MG PO TABS
5.0000 mg | ORAL_TABLET | Freq: Once | ORAL | Status: AC | PRN
Start: 2021-09-02 — End: 2021-09-02
  Administered 2021-09-02: 5 mg via ORAL

## 2021-09-02 MED ORDER — MORPHINE SULFATE (PF) 2 MG/ML IV SOLN: 1.0000 mg | INTRAVENOUS | Status: DC | PRN

## 2021-09-02 MED ORDER — ACETAMINOPHEN 10 MG/ML IV SOLN
INTRAVENOUS | Status: AC
  Filled 2021-09-02: qty 100

## 2021-09-02 SURGICAL SUPPLY — 38 items
ADH SKN CLS APL DERMABOND .7 (GAUZE/BANDAGES/DRESSINGS) ×2
BLADE SURG SZ11 CARB STEEL (BLADE) ×3 IMPLANT
CATH ROBINSON RED A/P 16FR (CATHETERS) ×3 IMPLANT
DERMABOND ADVANCED (GAUZE/BANDAGES/DRESSINGS) ×1
DERMABOND ADVANCED .7 DNX12 (GAUZE/BANDAGES/DRESSINGS) ×2 IMPLANT
DRSG TELFA 4X3 1S NADH ST (GAUZE/BANDAGES/DRESSINGS) ×3 IMPLANT
GAUZE 4X4 16PLY ~~LOC~~+RFID DBL (SPONGE) ×6 IMPLANT
GLOVE SURG ENC MOIS LTX SZ8 (GLOVE) ×6 IMPLANT
GLOVE SURG UNDER LTX SZ8 (GLOVE) ×3 IMPLANT
GOWN STRL REUS W/ TWL LRG LVL3 (GOWN DISPOSABLE) ×2 IMPLANT
GOWN STRL REUS W/ TWL XL LVL3 (GOWN DISPOSABLE) ×2 IMPLANT
GOWN STRL REUS W/TWL LRG LVL3 (GOWN DISPOSABLE) ×3
GOWN STRL REUS W/TWL XL LVL3 (GOWN DISPOSABLE) ×3
GRASPER SUT TROCAR 14GX15 (MISCELLANEOUS) ×3 IMPLANT
HANDPIECE ABLA MINERVA ENDO (MISCELLANEOUS) ×3 IMPLANT
IRRIGATION STRYKERFLOW (MISCELLANEOUS) ×2 IMPLANT
IRRIGATOR STRYKERFLOW (MISCELLANEOUS) ×3
KIT PINK PAD W/HEAD ARE REST (MISCELLANEOUS) ×3
KIT PINK PAD W/HEAD ARM REST (MISCELLANEOUS) ×2 IMPLANT
MANIFOLD NEPTUNE II (INSTRUMENTS) ×3 IMPLANT
NEEDLE VERESS 14GA 120MM (NEEDLE) ×3 IMPLANT
NS IRRIG 500ML POUR BTL (IV SOLUTION) ×3 IMPLANT
PACK GYN LAPAROSCOPIC (MISCELLANEOUS) ×3 IMPLANT
PAD PREP 24X41 OB/GYN DISP (PERSONAL CARE ITEMS) ×3 IMPLANT
SCISSORS METZENBAUM CVD 33 (INSTRUMENTS) ×3 IMPLANT
SCRUB EXIDINE 4% CHG 4OZ (MISCELLANEOUS) ×3 IMPLANT
SET TUBE SMOKE EVAC HIGH FLOW (TUBING) ×3 IMPLANT
SHEARS HARMONIC ACE PLUS 36CM (ENDOMECHANICALS) ×3 IMPLANT
SLEEVE ENDOPATH XCEL 5M (ENDOMECHANICALS) ×3 IMPLANT
SOL PREP PROV IODINE SCRUB 4OZ (MISCELLANEOUS) ×3 IMPLANT
SPONGE GAUZE 2X2 8PLY STRL LF (GAUZE/BANDAGES/DRESSINGS) ×3 IMPLANT
STRAP SAFETY 5IN WIDE (MISCELLANEOUS) ×3 IMPLANT
SUT VIC AB 0 CT1 36 (SUTURE) ×3 IMPLANT
SUT VIC AB 2-0 UR6 27 (SUTURE) ×3 IMPLANT
SUT VIC AB 4-0 PS2 18 (SUTURE) ×3 IMPLANT
SYR 10ML LL (SYRINGE) ×6 IMPLANT
TROCAR XCEL NON-BLD 5MMX100MML (ENDOMECHANICALS) ×3 IMPLANT
WATER STERILE IRR 500ML POUR (IV SOLUTION) ×3 IMPLANT

## 2021-09-02 NOTE — Anesthesia Preprocedure Evaluation (Addendum)
Anesthesia Evaluation  Patient identified by MRN, date of birth, ID band Patient awake    Reviewed: Allergy & Precautions, NPO status , Patient's Chart, lab work & pertinent test results  History of Anesthesia Complications Negative for: history of anesthetic complications  Airway Mallampati: I  TM Distance: >3 FB Neck ROM: Full    Dental  (+) Poor Dentition   Pulmonary asthma , former smoker (quit 2013),    breath sounds clear to auscultation- rhonchi (-) wheezing      Cardiovascular Exercise Tolerance: Good (-) hypertension(-) CAD, (-) Past MI, (-) Cardiac Stents and (-) CABG  Rhythm:Regular Rate:Normal - Systolic murmurs and - Diastolic murmurs    Neuro/Psych neg Seizures PSYCHIATRIC DISORDERS Anxiety Depression negative neurological ROS     GI/Hepatic negative GI ROS, Neg liver ROS,   Endo/Other  negative endocrine ROSneg diabetesObesity   Renal/GU Renal disease (nephrolithiasis)     Musculoskeletal  (+) Arthritis ,   Abdominal (+) + obese,   Peds  Hematology negative hematology ROS (+)   Anesthesia Other Findings Past Medical History: No date: Anxiety No date: Arthritis No date: Asthma No date: Chickenpox No date: Depression No date: Family history of breast cancer     Comment:  4/21 cancer genetic testing letter sent No date: History of kidney stones No date: History of UTI 2004: MRSA (methicillin resistant staph aureus) culture positive     Comment:  spider bite   Reproductive/Obstetrics                            Anesthesia Physical Anesthesia Plan  ASA: 2  Anesthesia Plan: General   Post-op Pain Management:    Induction: Intravenous  PONV Risk Score and Plan: 3 and Ondansetron, Dexamethasone and Treatment may vary due to age or medical condition  Airway Management Planned: Oral ETT  Additional Equipment:   Intra-op Plan:   Post-operative Plan: Extubation in  OR  Informed Consent: I have reviewed the patients History and Physical, chart, labs and discussed the procedure including the risks, benefits and alternatives for the proposed anesthesia with the patient or authorized representative who has indicated his/her understanding and acceptance.     Dental advisory given  Plan Discussed with: CRNA  Anesthesia Plan Comments:         Anesthesia Quick Evaluation

## 2021-09-02 NOTE — Discharge Instructions (Addendum)
AMBULATORY SURGERY  DISCHARGE INSTRUCTIONS   The drugs that you were given will stay in your system until tomorrow so for the next 24 hours you should not:  Drive an automobile Make any legal decisions Drink any alcoholic beverage   You may resume regular meals tomorrow.  Today it is better to start with liquids and gradually work up to solid foods.  You may eat anything you prefer, but it is better to start with liquids, then soup and crackers, and gradually work up to solid foods.   Please notify your doctor immediately if you have any unusual bleeding, trouble breathing, redness and pain at the surgery site, drainage, fever, or pain not relieved by medication.       Please contact your physician with any problems or Same Day Surgery at [phone], Monday through Friday 6 am to 4 pm, or [place] [facility] number at [phone].  

## 2021-09-02 NOTE — Anesthesia Procedure Notes (Signed)
Procedure Name: Intubation Date/Time: 09/02/2021 12:57 PM Performed by: Elmarie Mainland, CRNA Pre-anesthesia Checklist: Patient identified, Patient being monitored, Timeout performed, Emergency Drugs available and Suction available Patient Re-evaluated:Patient Re-evaluated prior to induction Oxygen Delivery Method: Circle system utilized Preoxygenation: Pre-oxygenation with 100% oxygen Induction Type: IV induction Ventilation: Mask ventilation without difficulty Laryngoscope Size: 3 and McGraph Grade View: Grade I Tube type: Oral Tube size: 7.0 mm Number of attempts: 1 Airway Equipment and Method: Stylet Placement Confirmation: ETT inserted through vocal cords under direct vision, positive ETCO2 and breath sounds checked- equal and bilateral Secured at: 21 cm Tube secured with: Tape Dental Injury: Teeth and Oropharynx as per pre-operative assessment

## 2021-09-02 NOTE — Op Note (Signed)
  Operative Note   09/02/2021  PRE-OP DIAGNOSIS: Desire for permanent sterilization Menorrhagia  POST-OP DIAGNOSIS: same   PROCEDURE: Procedure(s): LAPAROSCOPIC BILATERAL SALPINGECTOMY DILATATION AND CURETTAGE/HYSTEROSCOPY WITH MINERVA ENDOMETRIAL ABLATION  SURGEON: Annamarie Major, MD, FACOG  ANESTHESIA: Choice   ESTIMATED BLOOD LOSS: Minimal  COMPLICATIONS:  none  DISPOSITION: PACU - hemodynamically stable.  CONDITION: stable  FINDINGS: Exam under anesthesia revealed small, mobile small uterus with no masses and bilateral adnexa without masses or fullness. Hysteroscopy revealed no polyps or fibroids, otherwise grossly normal appearing uterine cavity with bilateral tubal ostia and normal appearing endocervical canal.   Laparoscopic survey of the abdomen revealed a grossly normal uterus, tubes, ovaries, liver edge, gallbladder edge and appendix, No intra-abdominal adhesions were noted.  PROCEDURE IN DETAIL: After informed consent was obtained, the patient was taken to the operating room where anesthesia was obtained without difficulty. The patient was positioned in the dorsal lithotomy position in Liberal stirrups. The patient's bladder was catheterized with an in and out foley catheter. The patient was examined under anesthesia, with the above noted findings. The weightedspeculum was placed inside the patient's vagina, and the the anterior lip of the cervix was seen and grasped with the tenaculum. The uterine cavity was sounded to 7cm, and then the cervix was progressively dilated to a 16 French-Pratt dilator. The 30 degree hysteroscope was introduced, with LR fluid used to distend the intrauterine cavity, with the above noted findings.   The Minerva endometrial ablation device was then placed without difficulty. Measurements were obtained. Patient was noted to have a uterine length of 7 cm, a cervical length of 2.5 cm. The device is first tested and after confirmation the procedures  performed. Length of procedure was 120 seconds. The ablation device is then removed and repeat hysteroscopy reveals an appropriate lining of the uterus and no perforation or injury. Hysteroscope is removed with minimal discrepancy of fluid.   Tenaculum was removed with excellent hemostasis noted.  Minimal discrepancy in fluid was noted. No bleeding.   A sponge stick was placed for manipulation purposes.  Attention was then turned to the patient's abdomen where a 5 mm skin incision was made in the umbilical fold, after injection of local anesthesia. The Veress step needle was carefully introduced into the peritoneal cavity with placement confirmed using the hanging drop technique.  Pneumoperitoneum was obtained. The 5 mm port was then placed under direct visualization with the operative laparoscope  The above noted findings.  Trendelenburg.  A 5 mm trocar was then placed in the right lower quadrant and suprapubic region under direct visualization with the laparoscope.  Right and left fallopian tubes are identified and followed out to their fimbria.  Each tube is excised utilizing the Harmonic scapel to include the fibria.  No injuries or bleeding was noted.  All instruments and ports were then removed from the abdomen after gas was expelled and patient was leveled.   The skin was closed with skin adhesive. The patient tolerated the procedure well. All counts were correct x 2. The patient was transferred to the recovery room awake, alert and breathing independently.  Annamarie Major, MD, [facility], [facility] 09/02/2021  1:55 PM

## 2021-09-02 NOTE — Interval H&P Note (Signed)
History and Physical Interval Note:  09/02/2021 11:18 AM  Katherine Ruiz  has presented today for surgery, with the diagnosis of Irregular bleeding Post coital bleeding Sterilization consult.  The various methods of treatment have been discussed with the patient and family. After consideration of risks, benefits and other options for treatment, the patient has consented to  Procedure(s): LAPAROSCOPIC BILATERAL SALPINGECTOMY (Bilateral) DILATATION AND CURETTAGE/HYSTEROSCOPY WITH MINERVA (N/A) as a surgical intervention.  The patient's history has been reviewed, patient examined, no change in status, stable for surgery.  I have reviewed the patient's chart and labs.  Questions were answered to the patient's satisfaction.     [name]

## 2021-09-02 NOTE — Anesthesia Postprocedure Evaluation (Signed)
Anesthesia Post Note  Patient: Katherine Ruiz  Procedure(s) Performed: LAPAROSCOPIC BILATERAL SALPINGECTOMY (Bilateral) DILATATION AND CURETTAGE/HYSTEROSCOPY WITH MINERVA  Patient location during evaluation: PACU Anesthesia Type: General Level of consciousness: awake and alert and oriented Pain management: pain level controlled Vital Signs Assessment: post-procedure vital signs reviewed and stable Respiratory status: spontaneous breathing, nonlabored ventilation and respiratory function stable Cardiovascular status: blood pressure returned to baseline and stable Postop Assessment: no signs of nausea or vomiting Anesthetic complications: no   No notable events documented.   Last Vitals:  Vitals:   09/02/21 1430 09/02/21 1450  BP: 105/69 105/66  Pulse: 83 71  Resp: 11 18  Temp: (!) 36.2 C (!) 36.3 C  SpO2: 95% 99%    Last Pain:  Vitals:   09/02/21 1450  TempSrc: Tympanic  PainSc:                  [name]

## 2021-09-02 NOTE — Transfer of Care (Signed)
Immediate Anesthesia Transfer of Care Note  Patient: Katherine Ruiz  Procedure(s) Performed: LAPAROSCOPIC BILATERAL SALPINGECTOMY (Bilateral) DILATATION AND CURETTAGE/HYSTEROSCOPY WITH MINERVA  Patient Location: PACU  Anesthesia Type:General  Level of Consciousness: drowsy  Airway & Oxygen Therapy: Patient Spontanous Breathing and Patient connected to face mask oxygen  Post-op Assessment: Report given to RN  Post vital signs: stable  Last Vitals:  Vitals Value Taken Time  BP 126/80 09/02/21 1400  Temp    Pulse 98 09/02/21 1403  Resp 23 09/02/21 1403  SpO2 100 % 09/02/21 1403  Vitals shown include unvalidated device data.  Last Pain:  Vitals:   09/02/21 1144  TempSrc: Temporal  PainSc: 0-No pain         Complications: No notable events documented.

## 2021-09-03 ENCOUNTER — Encounter: Payer: Self-pay | Admitting: Obstetrics & Gynecology

## 2021-09-03 LAB — SURGICAL PATHOLOGY

## 2021-09-15 ENCOUNTER — Encounter: Payer: Self-pay | Admitting: Obstetrics & Gynecology

## 2021-09-15 ENCOUNTER — Telehealth (INDEPENDENT_AMBULATORY_CARE_PROVIDER_SITE_OTHER): Payer: BC Managed Care – PPO | Admitting: Obstetrics & Gynecology

## 2021-09-15 DIAGNOSIS — Z09 Encounter for follow-up examination after completed treatment for conditions other than malignant neoplasm: Secondary | ICD-10-CM | POA: Diagnosis not present

## 2021-09-15 DIAGNOSIS — Z9889 Other specified postprocedural states: Secondary | ICD-10-CM | POA: Insufficient documentation

## 2021-09-15 DIAGNOSIS — N926 Irregular menstruation, unspecified: Secondary | ICD-10-CM

## 2021-09-15 DIAGNOSIS — N930 Postcoital and contact bleeding: Secondary | ICD-10-CM | POA: Diagnosis not present

## 2021-09-15 DIAGNOSIS — Z9851 Tubal ligation status: Secondary | ICD-10-CM | POA: Diagnosis not present

## 2021-09-15 DIAGNOSIS — Z3009 Encounter for other general counseling and advice on contraception: Secondary | ICD-10-CM

## 2021-09-15 HISTORY — DX: Other specified postprocedural states: Z98.890

## 2021-09-15 HISTORY — DX: Tubal ligation status: Z98.51

## 2021-09-15 NOTE — Progress Notes (Signed)
Virtual Visit via Video Note  I connected with Katherine Ruiz on 09/15/21 at 10:40 AM EDT by a video enabled telemedicine application and verified that I am speaking with the correct person using two identifiers.  Location: Patient: Work, Midwife: Office   I discussed the limitations of evaluation and management by telemedicine and the availability of in person appointments. The patient expressed understanding and agreed to proceed.  History of Present Illness: Postoperative Follow-up Patient presents post op from laparoscopy and operative hysteroscopy for abnormal uterine bleeding and requested sterilization, 2 weeks ago.  Subjective: Patient reports marked improvement in her preop symptoms. Eating a regular diet without difficulty. The patient is not having any pain.  Activity: normal activities of daily living. Patient reports additional symptom's since surgery of No bleeding.   Observations/Objective: No exam today, due to telephone eVisit due to [facility] virus restriction on elective visits and procedures.  Prior visits reviewed along with ultrasounds/labs as indicated.  Assessment and Plan:   ICD-10-CM   1. S/P endometrial ablation  Z98.890     2. S/P tubal ligation  Z98.51     3. Sterilization consult  Z30.09     4. Irregular bleeding  N92.6     5. PCB (post coital bleeding)  N93.0     Monitor for any bleeding/period concerns Pathology from surgery discussed (ectopic in one of her tubes found, despite neg urine preg test day of surgery)  Follow Up Instructions: Annual in spring 2023   I discussed the assessment and treatment plan with the patient. The patient was provided an opportunity to ask questions and all were answered. The patient agreed with the plan and demonstrated an understanding of the instructions.   The patient was advised to call back or seek an in-person evaluation if the symptoms worsen or if the condition fails to improve as anticipated.  A  total of 20 minutes were spent face-to-face with the patient as well as preparation, review, communication, and documentation during this encounter.   Annamarie Major, MD, [facility], [facility] 09/15/2021  10:56 AM

## 2021-11-27 ENCOUNTER — Other Ambulatory Visit: Payer: Self-pay | Admitting: Primary Care

## 2021-11-27 DIAGNOSIS — F331 Major depressive disorder, recurrent, moderate: Secondary | ICD-10-CM

## 2021-11-27 DIAGNOSIS — F411 Generalized anxiety disorder: Secondary | ICD-10-CM

## 2021-11-28 NOTE — Telephone Encounter (Signed)
Patient is overdue for follow up with me and will need to be scheduled before I can provide additional refills. Please schedule for ASAP.

## 2021-11-29 NOTE — Telephone Encounter (Signed)
Left message to return call to our office.  

## 2021-12-02 NOTE — Telephone Encounter (Signed)
Sent my chart message and left message on voice mail to call office.

## 2021-12-09 NOTE — Telephone Encounter (Signed)
Have called and sent my chart no call back. Last call today left message to call office.

## 2022-03-01 ENCOUNTER — Other Ambulatory Visit: Payer: Self-pay

## 2022-03-01 DIAGNOSIS — F331 Major depressive disorder, recurrent, moderate: Secondary | ICD-10-CM

## 2022-03-01 DIAGNOSIS — F411 Generalized anxiety disorder: Secondary | ICD-10-CM

## 2022-03-01 MED ORDER — CITALOPRAM HYDROBROMIDE 40 MG PO TABS: 40.0000 mg | ORAL_TABLET | Freq: Every day | ORAL | 0 refills | Status: DC

## 2022-03-01 NOTE — Telephone Encounter (Signed)
Noted. Refill(s) sent to pharmacy.  

## 2022-03-01 NOTE — Telephone Encounter (Signed)
Pt called in and made an appt for next available on May 4,23. Pt states that she was trying to go without her meds but she is struggling pretty bad.  ?Refill requested to get her to her appt, sent to [facility] on [address]  ?

## 2022-03-17 ENCOUNTER — Encounter: Payer: Self-pay | Admitting: Primary Care

## 2022-03-17 ENCOUNTER — Ambulatory Visit: Payer: BC Managed Care – PPO | Admitting: Primary Care

## 2022-03-17 DIAGNOSIS — J4520 Mild intermittent asthma, uncomplicated: Secondary | ICD-10-CM

## 2022-03-17 DIAGNOSIS — F411 Generalized anxiety disorder: Secondary | ICD-10-CM

## 2022-03-17 DIAGNOSIS — F331 Major depressive disorder, recurrent, moderate: Secondary | ICD-10-CM | POA: Diagnosis not present

## 2022-03-17 MED ORDER — CITALOPRAM HYDROBROMIDE 40 MG PO TABS: 40.0000 mg | ORAL_TABLET | Freq: Every day | ORAL | 3 refills | Status: DC

## 2022-03-17 MED ORDER — BUSPIRONE HCL 5 MG PO TABS
5.0000 mg | ORAL_TABLET | Freq: Two times a day (BID) | ORAL | 0 refills | Status: DC
Start: 2022-03-17 — End: 2022-04-17

## 2022-03-17 NOTE — Assessment & Plan Note (Signed)
Stable.  Continue citalopram 40 mg daily.

## 2022-03-17 NOTE — Assessment & Plan Note (Signed)
Controlled. ? ?Infrequent use of albuterol inhaler. ? ?Continue albuterol inhaler as needed. ?Continue Zyrtec 10 mg daily. ?
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
## 2022-03-17 NOTE — Patient Instructions (Signed)
Continue citalopram 40 mg daily for anxiety and depression. ? ?Start buspirone (BuSpar) 5 mg tablets for anxiety.  Take 1 tablet by mouth once or twice daily. ? ?You will be contacted regarding your referral to therapy.  Please let us know if you have not been contacted within two weeks.  ? ?It was a pleasure to see you today! ? ? ?
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
## 2022-03-29 ENCOUNTER — Encounter: Payer: Self-pay | Admitting: Family

## 2022-03-29 ENCOUNTER — Telehealth: Payer: Self-pay | Admitting: Primary Care

## 2022-03-29 ENCOUNTER — Ambulatory Visit: Payer: BC Managed Care – PPO | Admitting: Family

## 2022-03-29 ENCOUNTER — Ambulatory Visit (INDEPENDENT_AMBULATORY_CARE_PROVIDER_SITE_OTHER)
Admission: RE | Admit: 2022-03-29 | Discharge: 2022-03-29 | Disposition: A | Discharge status: Home / Self Care | Payer: BC Managed Care – PPO | Source: Ambulatory Visit | Attending: Family | Admitting: Family

## 2022-03-29 VITALS — BP 92/60 | HR 52 | Temp 98.3°F | Resp 16 | Ht 61.0 in | Wt 220.0 lb

## 2022-03-29 DIAGNOSIS — M62838 Other muscle spasm: Secondary | ICD-10-CM

## 2022-03-29 DIAGNOSIS — M542 Cervicalgia: Secondary | ICD-10-CM

## 2022-03-29 HISTORY — DX: Cervicalgia: M54.2

## 2022-03-29 HISTORY — DX: Other muscle spasm: M62.838

## 2022-03-29 MED ORDER — IBUPROFEN 600 MG PO TABS
600.0000 mg | ORAL_TABLET | Freq: Three times a day (TID) | ORAL | 0 refills | Status: AC | PRN
Start: 2022-03-29 — End: 2022-04-28

## 2022-03-29 MED ORDER — CYCLOBENZAPRINE HCL 10 MG PO TABS: 10.0000 mg | ORAL_TABLET | Freq: Three times a day (TID) | ORAL | 0 refills | Status: DC | PRN

## 2022-03-29 NOTE — Patient Instructions (Signed)
Complete xray(s) prior to leaving today. I will notify you of your results once received.  Due to recent changes in healthcare laws, you may see results of your imaging and/or laboratory studies on MyChart before I have had a chance to review them.  I understand that in some cases there may be results that are confusing or concerning to you. Please understand that not all results are received at the same time and often I may need to interpret multiple results in order to provide you with the best plan of care or course of treatment. Therefore, I ask that you please give me 2 business days to thoroughly review all your results before contacting my office for clarification. Should we see a critical lab result, you will be contacted sooner.   It was a pleasure seeing you today! Please do not hesitate to reach out with any questions and or concerns.  Regards,   Danielys Kaine Mcquillen FNP-C  

## 2022-03-29 NOTE — Telephone Encounter (Signed)
Pt called to schedule an appointment for today with [facility] @11 :00 for neck pain, I did transfer her to access nurse to speak with someone about it. She did state when she looks down her neck hurts, she has little headaches but no other symptoms besides the pain in her neck.  ? ?Callback Number: [phone] ?

## 2022-03-29 NOTE — Assessment & Plan Note (Signed)
Limited ROM ?Ordering xray cervical spine to r/o herniation, arthritis ?Lidocaine patch prn, ibuprofen 600 mg rx sent in as well as flexeril 10 mg prn  ?Warm heat to site  ?Handout given to pt for neck exercises ?

## 2022-03-29 NOTE — Telephone Encounter (Signed)
[facility] Day - Client ?TELEPHONE ADVICE RECORD ?AccessNurse? ?Patient ?Name: ?Katherine LASURE ?Ruiz ?Gender: Female ?DOB: 1991-03-17 ?Age: 31 Y 19 M 26 ?D ?Return ?Phone ?Number: ?[id number] ?(Primary) ?Address: ?City/ ?State/ ?Zip: ?[place] ?[address] ?Client [facility] Day - Client ?Client Site [facility] - Day ?Provider Vernona Rieger - NP ?Contact Type Call ?Who Is Calling Patient / Member / Family / Caregiver ?Call Type Triage / Clinical ?Relationship To Patient Self ?Return Phone Number [phone] (Primary) ?Chief Complaint Neck Pain ?Reason for Call Symptomatic / Request for Health Information ?Initial Comment Caller was transferred from the office. She states ?she is having pain in her neck every time she looks ?down. Caller states she has an an appointment 11. ?Translation No ?Nurse Assessment ?Nurse: Stefano Gaul, RN, Dwana Curd Date/Time (Eastern Time): 03/29/2022 10:10:38 AM ?Confirm and document reason for call. If ?symptomatic, describe symptoms. ?---Caller states she has appt at 11 am today. She has ?neck pain when she moves her neck. No injury. Pain ?started on Friday. no fever. pain level 8. ?Does the patient have any new or worsening ?symptoms? ---Yes ?Will a triage be completed? ---Yes ?Related visit to physician within the last 2 weeks? ---No ?Does the PT have any chronic conditions? (i.e. ?diabetes, asthma, this includes High risk factors for ?pregnancy, etc.) ?---No ?Is the patient pregnant or possibly pregnant? (Ask ?all females between the ages of 91-55) ---No ?Is this a behavioral health or substance abuse call? ---No ?Guidelines ?Guideline Title Affirmed Question Affirmed Notes Nurse Date/Time (Eastern ?Time) ?Neck Pain or ?Stiffness ?[1] SEVERE ?neck pain (e.g., ?excruciating, unable ?to do any normal ?activities) AND ?[2] not improved ?after 2 hours of pain ?medicine ?Stefano Gaul, RN, Vera 03/29/2022 10:12:09 ?AM ?PLEASE NOTE: All  timestamps contained within this report are represented as [place] Standard Time. ?CONFIDENTIALTY NOTICE: This fax transmission is intended only for the addressee. It contains information that is legally privileged, confidential or ?otherwise protected from use or disclosure. If you are not the intended recipient, you are strictly prohibited from reviewing, disclosing, copying using ?or disseminating any of this information or taking any action in reliance on or regarding this information. If you have received this fax in error, please ?notify us immediately by telephone so that we can arrange for its return to [place]. Phone: [phone], Toll-Free: [phone], Fax: [phone] ?Page: 2 of 2 ?Call Id: [id number] ?Disp. Time (Eastern ?Time) Disposition Final User ?03/29/2022 9:51:21 AM Attempt made - message left Stefano Gaul, RN, Dwana Curd ?03/29/2022 10:04:20 AM Attempt made - message left Stefano Gaul, RN, Dwana Curd ?03/29/2022 10:14:31 AM See HCP within 4 Hours (or ?PCP triage) ?Yes Stefano Gaul, RN, Dwana Curd ?Caller Disagree/Comply Comply ?Caller Understands Yes ?PreDisposition Call Doctor ?Care Advice Given Per Guideline ?SEE HCP (OR PCP TRIAGE) WITHIN 4 HOURS: * IF OFFICE WILL BE OPEN: You need to be seen within the next 3 or 4 ?hours. Call your doctor (or NP/PA) now or as soon as the office opens. CALL BACK IF: * You become worse CARE ADVICE ?given per Neck Pain (Adult) guideline. ?Referrals ?REFERRED TO PCP OFFIC ?

## 2022-03-29 NOTE — Telephone Encounter (Signed)
Agree with precautions given to pt  Agree with nurse assessment in plan.  Thank you for speaking with them. 
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
## 2022-03-29 NOTE — Telephone Encounter (Signed)
Per appt notes pt already has appt scheduled with T Dugal FNP 03/29/22 at 11AM. Sending note to Hayden Pedro FNP. ?

## 2022-03-29 NOTE — Assessment & Plan Note (Signed)
rx flexeril 10 mg ?

## 2022-04-17 ENCOUNTER — Other Ambulatory Visit: Payer: Self-pay | Admitting: Primary Care

## 2022-04-17 DIAGNOSIS — F411 Generalized anxiety disorder: Secondary | ICD-10-CM

## 2022-05-30 ENCOUNTER — Telehealth: Payer: Self-pay

## 2022-05-30 ENCOUNTER — Other Ambulatory Visit: Payer: Self-pay

## 2022-05-30 ENCOUNTER — Emergency Department: Payer: BC Managed Care – PPO

## 2022-05-30 ENCOUNTER — Emergency Department
Admission: EM | Admit: 2022-05-30 | Discharge: 2022-05-30 | Disposition: A | Discharge status: Home / Self Care | Payer: BC Managed Care – PPO | Attending: Emergency Medicine | Admitting: Emergency Medicine

## 2022-05-30 DIAGNOSIS — R0789 Other chest pain: Secondary | ICD-10-CM | POA: Diagnosis present

## 2022-05-30 DIAGNOSIS — J45909 Unspecified asthma, uncomplicated: Secondary | ICD-10-CM | POA: Diagnosis not present

## 2022-05-30 DIAGNOSIS — R079 Chest pain, unspecified: Secondary | ICD-10-CM

## 2022-05-30 LAB — COMPREHENSIVE METABOLIC PANEL
ALT: 17 U/L (ref 0–44)
AST: 18 U/L (ref 15–41)
Albumin: 4 g/dL (ref 3.5–5.0)
Alkaline Phosphatase: 35 U/L — ABNORMAL LOW (ref 38–126)
Anion gap: 6 (ref 5–15)
BUN: 19 mg/dL (ref 6–20)
CO2: 25 mmol/L (ref 22–32)
Calcium: 9.2 mg/dL (ref 8.9–10.3)
Chloride: 109 mmol/L (ref 98–111)
Creatinine, Ser: 0.89 mg/dL (ref 0.44–1.00)
GFR, Estimated: >60 mL/min (ref >60)
Glucose, Bld: 91 mg/dL (ref 70–99)
Potassium: 4.2 mmol/L (ref 3.5–5.1)
Sodium: 140 mmol/L (ref 135–145)
Total Bilirubin: 0.3 mg/dL (ref 0.3–1.2)
Total Protein: 6.7 g/dL (ref 6.5–8.1)

## 2022-05-30 LAB — TROPONIN I (HIGH SENSITIVITY): Troponin I (High Sensitivity): <2 ng/L (ref <18)

## 2022-05-30 LAB — CBC
HCT: 36.9 % (ref 36.0–46.0)
Hemoglobin: 12.3 g/dL (ref 12.0–15.0)
MCH: 33 pg (ref 26.0–34.0)
MCHC: 33.3 g/dL (ref 30.0–36.0)
MCV: 98.9 fL (ref 80.0–100.0)
Platelets: 290 10*3/uL (ref 150–400)
RBC: 3.73 MIL/uL — ABNORMAL LOW (ref 3.87–5.11)
RDW: 12.5 % (ref 11.5–15.5)
WBC: 10.1 10*3/uL (ref 4.0–10.5)
nRBC: 0 % (ref 0.0–0.2)

## 2022-05-30 LAB — POC URINE PREG, ED: Preg Test, Ur: NEGATIVE

## 2022-05-30 MED ORDER — OXYCODONE-ACETAMINOPHEN 5-325 MG PO TABS
1.0000 | ORAL_TABLET | Freq: Once | ORAL | Status: AC
  Administered 2022-05-30: 1 via ORAL
  Filled 2022-05-30: qty 1

## 2022-05-30 NOTE — ED Triage Notes (Addendum)
Pt in from home due to CP x3days; pt reports woke up with soreness then started having tightness in center/L chest that is constant; denies dizziness, weakness, diaphoresis, SOB, and nausea. Denies tingling/numbness in limbs, denies jaw pain or numbness. Reports minimal upper back pain. Pt reports pain gets worse upon deep breathing. Pt in NAD.

## 2022-05-30 NOTE — ED Provider Notes (Signed)
[facility] Provider Note    Event Date/Time   First MD Initiated Contact with Patient 05/30/22 1813     (approximate)   History   Chest Pain   HPI  Katherine Ruiz is a 31 y.o. female with past medical history of anxiety asthma depression who presents with chest pain.  Symptoms started several days ago but have been worsening.  She has pain in the left side of her chest described as pressure-like constant feels worse with lying flat and with taking deep breath but denies dyspnea.  Denies cough fevers chills nausea vomiting or diaphoresis.  Did have some cramping in her right calf several days ago but this is resolved denies swelling.  The patient denies hx of prior DVT/PE, unilateral leg pain/swelling, hormone use, recent surgery, hx of cancer, prolonged immobilization, or hemoptysis.  Denies history of similar.     Past Medical History:  Diagnosis Date   Anxiety    Arthritis    Asthma    Cervical radiculopathy 09/01/2018   Chickenpox    Depression    Family history of breast cancer    4/21 cancer genetic testing letter sent   History of kidney stones    History of MRSA infection 10/17/2014   Formatting of this note might be different from the original. Hospitalized at [facility] 08/2014. I+D, IV antibiotics   History of UTI    MRSA (methicillin resistant staph aureus) culture positive 2004   spider bite   S/P endometrial ablation 09/15/2021   S/P tubal ligation 09/15/2021    Patient Active Problem List   Diagnosis Date Noted   Muscle spasm 03/29/2022   Neck pain 03/29/2022   GAD (generalized anxiety disorder) 12/19/2018   Asthma 08/15/2018   Major depressive disorder 11/20/2015     Physical Exam  Triage Vital Signs: ED Triage Vitals  Enc Vitals Group     BP 05/30/22 1535 112/84     Pulse Rate 05/30/22 1535 61     Resp 05/30/22 1535 18     Temp 05/30/22 1535 97.9 F (36.6 C)     Temp Source 05/30/22 1535 Oral     SpO2 05/30/22  1535 97 %     Weight 05/30/22 1537 185 lb (83.9 kg)     Height 05/30/22 1537 5\' 1"  (1.549 m)     Head Circumference --      Peak Flow --      Pain Score 05/30/22 1536 6     Pain Loc --      Pain Edu? --      Excl. in GC? --     Most recent vital signs: Vitals:   05/30/22 1535 05/30/22 1833  BP: 112/84 118/80  Pulse: 61 60  Resp: 18 18  Temp: 97.9 F (36.6 C)   SpO2: 97% 98%     General: Awake, no distress.  CV:  Good peripheral perfusion. No edema, ttp, or asymmetry  Resp:  Normal effort.  Lungs are clear, patient holds the left side of her chest when taking a deep breath Abd:  No distention.  Neuro:             Awake, Alert, Oriented x 3  Other:     ED Results / Procedures / Treatments  Labs (all labs ordered are listed, but only abnormal results are displayed) Labs Reviewed  CBC - Abnormal; Notable for the following components:      Result Value   RBC 3.73 (*)  All other components within normal limits  COMPREHENSIVE METABOLIC PANEL - Abnormal; Notable for the following components:   Alkaline Phosphatase 35 (*)    All other components within normal limits  POC URINE PREG, ED  TROPONIN I (HIGH SENSITIVITY)     EKG  EKG interpretation performed by myself: NSR, nml axis, nml intervals, no acute ischemic changes     RADIOLOGY I reviewed and interpreted the CXR which does not show any acute cardiopulmonary process    PROCEDURES:  Critical Care performed: No  Procedures   MEDICATIONS ORDERED IN ED: Medications  oxyCODONE-acetaminophen (PERCOCET/ROXICET) 5-325 MG per tablet 1 tablet (1 tablet Oral Given 05/30/22 1848)     IMPRESSION / MDM / ASSESSMENT AND PLAN / ED COURSE  I reviewed the triage vital signs and the nursing notes.                              Patient's presentation is most consistent with acute presentation with potential threat to life or bodily function.  Differential diagnosis includes, but is not limited to, pleurisy,  pneumonia, pulmonary embolism, pleural effusion, ACS  The patient is a 31 year old female is otherwise healthy presents with several days of pleuritic left-sided chest pain.  Has no history of similar denies frank dyspnea.  Her vital signs are within normal limits she does look somewhat uncomfortable and holds the left side of her chest with breathing.  Her EKG is nonischemic without any signs of right heart strain chest x-ray is normal and labs including a troponin are negative.  Strongly considering diagnosis of PE given the strong pleuritic component of the pain however she is PERC negative so feel that this is less likely.  We will treat as pleurisy with NSAIDs.  Discussed return precautions.       FINAL CLINICAL IMPRESSION(S) / ED DIAGNOSES   Final diagnoses:  Chest pain, unspecified type     Rx / DC Orders   ED Discharge Orders     None        Note:  This document was prepared using Dragon voice recognition software and may include unintentional dictation errors.   Georga Hacking, MD 05/30/22 [phone]

## 2022-05-30 NOTE — ED Notes (Signed)
EKG to EDP Funke in person.  

## 2022-05-30 NOTE — Telephone Encounter (Signed)
I spoke with pt; pt is waiting for her husband who is on his way to take pt to [facility] ED for eval. Pt said she is OK to wait on him to take her. UC & ED precautions given and pt voiced understanding and appreciative of call. Sending note to Allayne Gitelman NP and [facility] CMA.

## 2022-05-30 NOTE — ED Notes (Signed)
Attempted for blood-work x1. 

## 2022-05-30 NOTE — Telephone Encounter (Signed)
[facility] - Client TELEPHONE ADVICE RECORD Access[facility]urse Patient Name: Katherine Ruiz Gender: Female DOB: May 13, 1991 Age: 31 Y 1 M 27 D Return Phone Number: [phone] (Primary) Address[place]  [address] Client [facility] - Client Client Site [facility] - Day Provider Vernona Rieger - NP Contact Type Call Who Is Calling Patient / Member / Family / Caregiver Call Type Triage / Clinical Relationship To Patient Self Return Phone Number [phone] (Primary) Chief Complaint CHEST PAIN - pain, pressure, heaviness or tightness Reason for Call Symptomatic / Request for Health Information Initial Comment Caller states she has chest pains for 2-3 days. Translation No Nurse Assessment Nurse: Howell Pringle, RN, Cala Bradford Date/Time (Eastern Time): 05/30/2022 12:36:22 PM Confirm and document reason for call. If symptomatic, describe symptoms. ---Caller states she has chest pains for 2-3 days. Started with pain Left side upper breast area and thought it was a pulled muscle. Got better before worse but full force this morning. Heavy and tight and no spreading. Breathing deep or cough worsening. Denies radiation. Pain/Pressure (5/10) Does the patient have any new or worsening symptoms? ---Yes Will a triage be completed? ---Yes Related visit to physician within the last 2 weeks? ---No Does the PT have any chronic conditions? (i.e. diabetes, asthma, this includes High risk factors for pregnancy, etc.) ---Yes List chronic conditions. ---Anxiety/Depression Is the patient pregnant or possibly pregnant? (Ask all females between the ages of 52-55) ---No Is this a behavioral health or substance abuse call? ---No Guidelines Guideline Title Affirmed Question Affirmed Notes Nurse Date/Time (Eastern Time) Chest Pain [1] Chest pain lasts > 5 minutes AND [2] age > 30 AND [3] one or  more cardiac risk factors (e.g., diabetes, high Stripling, RN, Cala Bradford 05/30/2022 12:39:18 PM PLEASE NOTE: All timestamps contained within this report are represented as [place] Standard Time. CONFIDENTIALTY NOTICE: This fax transmission is intended only for the addressee. It contains information that is legally privileged, confidential or otherwise protected from use or disclosure. If you are not the intended recipient, you are strictly prohibited from reviewing, disclosing, copying using or disseminating any of this information or taking any action in reliance on or regarding this information. If you have received this fax in error, please notify us immediately by telephone so that we can arrange for its return to [place]. Phone: [phone], Toll-Free: [phone], Fax: [phone] Page: 2 of 2 Call Id: [id number] Guidelines Guideline Title Affirmed Question Affirmed Notes Nurse Date/Time Lamount Cohen Time) blood pressure, high cholesterol, smoker, or strong family history of heart disease) Disp. Time Lamount Cohen Time) Disposition Final User 05/30/2022 12:35:12 PM Send to Urgent Stevphen Rochester 05/30/2022 12:41:43 PM Call EMS 911 Now Yes Howell Pringle, RN, Cala Bradford Final Disposition 05/30/2022 12:41:43 PM Call EMS 911 Now Yes Stripling, RN, Anders Simmonds Disagree/Comply Comply Caller Understands Yes PreDisposition InappropriateToAsk Care Advice Given Per Guideline CALL EMS 911 NOW: * Immediate medical attention is needed. You need to hang up and call 911 (or an ambulance). * Triager Discretion: I'll call you back in a few minutes to be sure you were able to reach them. CARE ADVICE given per Chest Pain (Adult) guideline. Comments User: Dorothy Puffer, RN Date/Time Lamount Cohen Time): 05/30/2022 12:42:56 PM Pt refused to call 911. Will have husband drive her. Referrals [facility] - E

## 2022-05-30 NOTE — ED Notes (Signed)
See triage note  Presents with a 3 day hx of chest pain   States developed soreness is in mid chest   Denies any fever or cough

## 2022-05-30 NOTE — ED Provider Triage Note (Signed)
  Emergency Medicine Provider Triage Evaluation Note  Katherine Ruiz , a 31 y.o.female,  was evaluated in triage.  Pt complains of chest pain x2 days.  Described as constant, sternal pressure.  Reports worsening pain with deep breath   Review of Systems  Positive: Chest pain Negative: Denies fever, abdominal pain, vomiting  Physical Exam   Vitals:   05/30/22 1535  BP: 112/84  Pulse: 61  Resp: 18  Temp: 97.9 F (36.6 C)  SpO2: 97%   Gen:   Awake, no distress   Resp:  Normal effort  MSK:   Moves extremities without difficulty  Other:    Medical Decision Making  Given the patient's initial medical screening exam, the following diagnostic evaluation has been ordered. The patient will be placed in the appropriate treatment space, once one is available, to complete the evaluation and treatment. I have discussed the plan of care with the patient and I have advised the patient that an ED physician or mid-level practitioner will reevaluate their condition after the test results have been received, as the results may give them additional insight into the type of treatment they may need.    Diagnostics: Labs, EKG, CXR  Treatments: none immediately   Varney Daily, [place] 05/30/22 1603

## 2022-05-30 NOTE — Discharge Instructions (Signed)
Your blood work EKG and chest x-ray were all reassuring.  Your pain may be due to something called pleurisy which can be from a viral infection and causes pain with inspiration.  Please take ibuprofen 400 mg every 6 hours for pain.  If your pain is worsening despite the ibuprofen you develop shortness of breath or other symptoms that are concerning to you please return to the emergency department.

## 2022-05-31 NOTE — Telephone Encounter (Signed)
Noted. ED notes reviewed.

## 2022-06-01 ENCOUNTER — Ambulatory Visit: Payer: BC Managed Care – PPO | Admitting: Primary Care

## 2022-06-01 ENCOUNTER — Encounter: Payer: Self-pay | Admitting: Primary Care

## 2022-06-01 DIAGNOSIS — R079 Chest pain, unspecified: Secondary | ICD-10-CM | POA: Diagnosis not present

## 2022-06-01 HISTORY — DX: Chest pain, unspecified: R07.9

## 2022-06-01 MED ORDER — CYCLOBENZAPRINE HCL 5 MG PO TABS: 5.0000 mg | ORAL_TABLET | Freq: Three times a day (TID) | ORAL | 0 refills | Status: DC | PRN

## 2022-06-01 NOTE — Progress Notes (Signed)
Subjective:    Patient ID: Katherine Ruiz, female    DOB: 1991/03/06, 31 y.o.   MRN: [id number]  HPI  Katherine Ruiz is a very pleasant 31 y.o. female with a history of asthma, GAD, who presents today for ED follow up.  She presented to [facility] ED on 05/30/22 for a several day history of left-sided chest pain for which she described as a pressure, worse with laying flat and taking deep breaths.  During her stay in the ED exam and vitals were within normal limits.  EKG was without acute changes, labs were negative. She did not undergo CTA for PE rule out as she was PERC negative. She was diagnosed with pleurisy and treated with NSAIDs.  Since her ED visit she's been taking Ibuprofen 400 mg every 4 hours without improvement. She continues to feel a pressure to the left chest for which is constant, worse when laying down and worse when taking deep breaths. She recently noticed increased pain when raising her left arm above her head. She does have tenderness to her left chest upon palpation.   She denies strenuous activity, heavy lifting, esophageal burning, belching, wheezing, increased SOB, increased anxiety, cough/cold symptoms. She is not a smoker, is not on OCP's. She's never had a DVT or PE. She has not used her albuterol inhaler.   Review of Systems  HENT:  Negative for congestion, postnasal drip and rhinorrhea.   Respiratory:  Negative for chest tightness, shortness of breath and wheezing.   Cardiovascular:  Positive for chest pain.  Gastrointestinal:        Denies GERD symptoms          Past Medical History:  Diagnosis Date   Anxiety    Arthritis    Asthma    Cervical radiculopathy 09/01/2018   Chickenpox    Depression    Family history of breast cancer    4/21 cancer genetic testing letter sent   History of kidney stones    History of MRSA infection 10/17/2014   Formatting of this note might be different from the original. Hospitalized at [facility] 08/2014. I+D, IV  antibiotics   History of UTI    MRSA (methicillin resistant staph aureus) culture positive 2004   spider bite   S/P endometrial ablation 09/15/2021   S/P tubal ligation 09/15/2021    Social History   Socioeconomic History   Marital status: Married    Spouse name: Not on file   Number of children: Not on file   Years of education: Not on file   Highest education level: Not on file  Occupational History   Not on file  Tobacco Use   Smoking status: Former    Types: Cigarettes    Quit date: 10/04/2012    Years since quitting: 9.6   Smokeless tobacco: Never  Vaping Use   Vaping Use: Never used  Substance and Sexual Activity   Alcohol use: Yes    Comment: occasionally   Drug use: Yes    Types: Marijuana   Sexual activity: Not Currently  Other Topics Concern   Not on file  Social History Narrative   Married   2 children born 2010 and 2011, both girls   Works as an Airline pilot for an [employer]   Enjoys walking, running, spending time with her family   12/22/2016   Social Determinants of Health   Financial Resource Strain: Not on file  Food Insecurity: Not on file  Transportation Needs: Not on  file  Physical Activity: Not on file  Stress: Not on file  Social Connections: Not on file  Intimate Partner Violence: Not on file    Past Surgical History:  Procedure Laterality Date   DILATATION AND CURETTAGE/HYSTEROSCOPY WITH MINERVA N/A 09/02/2021   Procedure: DILATATION AND CURETTAGE/HYSTEROSCOPY WITH MINERVA;  Surgeon: Nadara Mustard, MD;  Location: [facility] ORS;  Service: Gynecology;  Laterality: N/A;   FOOT SURGERY Left 11/14/2012   screw in foot   LAPAROSCOPIC BILATERAL SALPINGECTOMY Bilateral 09/02/2021   Procedure: LAPAROSCOPIC BILATERAL SALPINGECTOMY;  Surgeon: Nadara Mustard, MD;  Location: [facility] ORS;  Service: Gynecology;  Laterality: Bilateral;   TUBAL LIGATION     WOUND DEBRIDEMENT Right 12/2012   inner thigh with drain ( spider bite MRSA)    Family  History  Problem Relation Age of Onset   Arthritis Father    Hyperlipidemia Father    Hypertension Father    Mental illness Father    Diabetes Father    Hyperlipidemia Maternal Grandmother    Arthritis Maternal Grandmother    Breast cancer Maternal Grandmother    Hypertension Maternal Grandmother    Mental illness Maternal Grandmother    Diabetes Maternal Grandmother    Cancer Maternal Grandmother 58       Breast   Cancer - Other Maternal Grandmother        bile duct   Arthritis Maternal Grandfather    Mental illness Paternal Grandmother    Hypertension Paternal Grandmother    Arthritis Paternal Grandmother    Arthritis Paternal Grandfather    Mental illness Paternal Grandfather    Hypertension Paternal Grandfather     Allergies  Allergen Reactions   Vancomycin Swelling    Face and throat   Lamotrigine Hives   Septra [Sulfamethoxazole-Trimethoprim] Rash    Current Outpatient Medications on File Prior to Visit  Medication Sig Dispense Refill   albuterol (VENTOLIN HFA) 108 (90 Base) MCG/ACT inhaler Inhale 2 puffs into the lungs every 6 (six) hours as needed for shortness of breath. 1 each 0   busPIRone (BUSPAR) 5 MG tablet TAKE 1 TABLET (5 MG TOTAL) BY MOUTH 2 (TWO) TIMES DAILY. FOR ANXIETY. 180 tablet 0   cetirizine (ZYRTEC) 10 MG tablet Take 10 mg by mouth in the morning.     citalopram (CELEXA) 40 MG tablet Take 1 tablet (40 mg total) by mouth daily. For anxiety and depression. 90 tablet 3   No current facility-administered medications on file prior to visit.    BP 122/82   Pulse 65   Temp 98.6 F (37 C) (Oral)   Ht 5\' 1"  (1.549 m)   Wt 212 lb (96.2 kg)   LMP 09/13/2021 (Approximate) Comment: Tubal Ligation  SpO2 97%   BMI 40.06 kg/m  Objective:   Physical Exam Cardiovascular:     Rate and Rhythm: Normal rate and regular rhythm.  Pulmonary:     Effort: Pulmonary effort is normal.     Breath sounds: Normal breath sounds.  Chest:     Chest wall: Tenderness  present.       Comments: Chest wall tenderness with light palpation.  Chest wall pain with abduction of left upper extremity. Musculoskeletal:     Right shoulder: Normal. Normal range of motion.     Left shoulder: Normal. Normal range of motion.     Cervical back: Neck supple.  Skin:    General: Skin is warm and dry.           Assessment & Plan:  Problem List Items Addressed This Visit       Other   Chest pain    Suspect MSK etiology, especially since her pain is reproducible with movement and tenderness on exam today. Lower suspicion for PE, but will keep on differentials list.  Continue Ibuprofen. Add cyclobenzaprine 5 mg TID PRN, drowsiness precautions provided. Also discuss use of albuterol inhaler if needed.  If no improvement then consider work up for PE. She will update in a few days.  ED precautions provided. ED notes, labs, imaging reviewed.       Relevant Medications   cyclobenzaprine (FLEXERIL) 5 MG tablet       Doreene Nest, NP

## 2022-06-01 NOTE — Patient Instructions (Signed)
You may take cyclobenzaprine 5 mg three times daily as needed. This may cause drowsiness.  Continue Ibuprofen.  Please update me early next week if no improvement.  It was a pleasure to see you today!

## 2022-06-01 NOTE — Assessment & Plan Note (Signed)
Suspect MSK etiology, especially since her pain is reproducible with movement and tenderness on exam today. Lower suspicion for PE, but will keep on differentials list.  Continue Ibuprofen. Add cyclobenzaprine 5 mg TID PRN, drowsiness precautions provided. Also discuss use of albuterol inhaler if needed.  If no improvement then consider work up for PE. She will update in a few days.  ED precautions provided. ED notes, labs, imaging reviewed.

## 2022-06-09 DIAGNOSIS — R079 Chest pain, unspecified: Secondary | ICD-10-CM

## 2022-06-13 ENCOUNTER — Other Ambulatory Visit (INDEPENDENT_AMBULATORY_CARE_PROVIDER_SITE_OTHER): Payer: BC Managed Care – PPO

## 2022-06-13 DIAGNOSIS — R079 Chest pain, unspecified: Secondary | ICD-10-CM | POA: Diagnosis not present

## 2022-06-14 LAB — D-DIMER, QUANTITATIVE: D-Dimer, Quant: <0.19 mcg/mL FEU (ref <0.50)

## 2022-07-22 ENCOUNTER — Other Ambulatory Visit: Payer: Self-pay | Admitting: Primary Care

## 2022-07-22 DIAGNOSIS — F411 Generalized anxiety disorder: Secondary | ICD-10-CM

## 2022-10-10 ENCOUNTER — Telehealth: Payer: BC Managed Care – PPO | Admitting: Physician Assistant

## 2022-10-10 DIAGNOSIS — L02419 Cutaneous abscess of limb, unspecified: Secondary | ICD-10-CM

## 2022-10-10 DIAGNOSIS — Z8614 Personal history of Methicillin resistant Staphylococcus aureus infection: Secondary | ICD-10-CM | POA: Diagnosis not present

## 2022-10-10 MED ORDER — DOXYCYCLINE HYCLATE 100 MG PO TABS
100.0000 mg | ORAL_TABLET | Freq: Two times a day (BID) | ORAL | 0 refills | Status: DC
Start: 2022-10-10 — End: 2022-10-24

## 2022-10-10 NOTE — Progress Notes (Signed)
E Visit for Cellulitis  We are sorry that you are not feeling well. Here is how we plan to help!  Based on what you shared with me it looks like you have cellulitis.  Cellulitis looks like areas of skin redness, swelling, and warmth; it develops as a result of bacteria entering under the skin. Little red spots and/or bleeding can be seen in skin, and tiny surface sacs containing fluid can occur. Fever can be present. Cellulitis is almost always on one side of a body, and the lower limbs are the most common site of involvement.   I have prescribed:  Doxycycline 100mg  Take 1 tablet twice daily for 7 days  HOME CARE:  Take your medications as ordered and take all of them, even if the skin irritation appears to be healing.   GET HELP RIGHT AWAY IF:  Symptoms that don't begin to go away within 48 hours. Severe redness persists or worsens If the area turns color, spreads or swells. If it blisters and opens, develops yellow-brown crust or bleeds. You develop a fever or chills. If the pain increases or becomes unbearable.  Are unable to keep fluids and food down.  MAKE SURE YOU   Understand these instructions. Will watch your condition. Will get help right away if you are not doing well or get worse.  Thank you for choosing an e-visit.  Your e-visit answers were reviewed by a board certified advanced clinical practitioner to complete your personal care plan. Depending upon the condition, your plan could have included both over the counter or prescription medications.  Please review your pharmacy choice. Make sure the pharmacy is open so you can pick up prescription now. If there is a problem, you may contact your provider through and have the prescription routed to another pharmacy.  Your safety is important to [employer]. If you have drug allergies check your prescription carefully.   For the next 24 hours you can use MyChart to ask questions about today's visit, request a  non-urgent call back, or ask for a work or school excuse. You will get an email in the next two days asking about your experience. I hope that your e-visit has been valuable and will speed your recovery.  I have spent 5 minutes in review of e-visit questionnaire, review and updating patient chart, medical decision making and response to patient.   [place], PA-C

## 2022-10-24 ENCOUNTER — Ambulatory Visit: Payer: BC Managed Care – PPO | Admitting: Primary Care

## 2022-10-24 ENCOUNTER — Encounter: Payer: Self-pay | Admitting: Primary Care

## 2022-10-24 VITALS — HR 67 | Temp 98.1°F | Ht 61.0 in | Wt 221.0 lb

## 2022-10-24 DIAGNOSIS — U071 COVID-19: Secondary | ICD-10-CM | POA: Insufficient documentation

## 2022-10-24 DIAGNOSIS — R051 Acute cough: Secondary | ICD-10-CM | POA: Diagnosis not present

## 2022-10-24 HISTORY — DX: COVID-19: U07.1

## 2022-10-24 LAB — POCT RAPID STREP A (OFFICE): Rapid Strep A Screen: NEGATIVE

## 2022-10-24 LAB — POC COVID19 BINAXNOW: SARS Coronavirus 2 Ag: POSITIVE — AB

## 2022-10-24 LAB — POCT INFLUENZA A/B
Influenza A, POC: NEGATIVE
Influenza B, POC: NEGATIVE

## 2022-10-24 MED ORDER — NIRMATRELVIR/RITONAVIR (PAXLOVID)TABLET
3.0000 | ORAL_TABLET | Freq: Two times a day (BID) | ORAL | 0 refills | Status: AC
Start: 2022-10-24 — End: 2022-10-29

## 2022-10-24 MED ORDER — BENZONATATE 200 MG PO CAPS: 200.0000 mg | ORAL_CAPSULE | Freq: Three times a day (TID) | ORAL | 0 refills | Status: DC | PRN

## 2022-10-24 MED ORDER — GUAIFENESIN-CODEINE 100-10 MG/5ML PO SYRP: 5.0000 mL | ORAL_SOLUTION | Freq: Every evening | ORAL | 0 refills | Status: DC | PRN

## 2022-10-24 MED ORDER — PREDNISONE 20 MG PO TABS: ORAL_TABLET | ORAL | 0 refills | Status: DC

## 2022-10-24 NOTE — Progress Notes (Signed)
Subjective:    Patient ID: Katherine Ruiz, female    DOB: 1991/05/20, 31 y.o.   MRN: [id number]  HPI  Katherine Ruiz is a very pleasant 31 y.o. female with a history of asthma, GAD, MDD who presents today to discuss URI symptoms.  Symptom onset 3 days ago with fatigue. The following day she woke up with sore throat, headaches, body aches, and nasal congestion. Later that night she developed a cough. This morning she woke up with a fever of 101. She proceeded to work today, worse a mask.   She's experiencing increased shortness of breath and chest tightness, especially at night. She is using her albuterol inhaler which has helped temporarily.   She works with elementary school, so it's likely that she has been exposed to something.   She's been taking Advil Cold and Sinus and Dayquil without much improvement. She took a home Covid-19 test yesterday which was negative.    Review of Systems  Constitutional:  Positive for chills, fatigue and fever.  HENT:  Positive for congestion, postnasal drip and sore throat.   Respiratory:  Positive for cough.   Neurological:  Positive for headaches.         Past Medical History:  Diagnosis Date   Anxiety    Arthritis    Asthma    Cervical radiculopathy 09/01/2018   Chickenpox    Depression    Family history of breast cancer    4/21 cancer genetic testing letter sent   History of kidney stones    History of MRSA infection 10/17/2014   Formatting of this note might be different from the original. Hospitalized at [facility] 08/2014. I+D, IV antibiotics   History of UTI    MRSA (methicillin resistant staph aureus) culture positive 2004   spider bite   S/P endometrial ablation 09/15/2021   S/P tubal ligation 09/15/2021    Social History   Socioeconomic History   Marital status: Married    Spouse name: Not on file   Number of children: Not on file   Years of education: Not on file   Highest education level: Not on file   Occupational History   Not on file  Tobacco Use   Smoking status: Former    Types: Cigarettes    Quit date: 10/04/2012    Years since quitting: 10.0   Smokeless tobacco: Never  Vaping Use   Vaping Use: Never used  Substance and Sexual Activity   Alcohol use: Yes    Comment: occasionally   Drug use: Yes    Types: Marijuana   Sexual activity: Not Currently  Other Topics Concern   Not on file  Social History Narrative   Married   2 children born 2010 and 2011, both girls   Works as an Airline pilot for [employer]   Enjoys walking, running, spending time with her family   12/22/2016   Social Determinants of Health   Financial Resource Strain: Not on file  Food Insecurity: Not on file  Transportation Needs: Not on file  Physical Activity: Not on file  Stress: Not on file  Social Connections: Not on file  Intimate Partner Violence: Not on file    Past Surgical History:  Procedure Laterality Date   DILATATION AND CURETTAGE/HYSTEROSCOPY WITH MINERVA N/A 09/02/2021   Procedure: DILATATION AND CURETTAGE/HYSTEROSCOPY WITH MINERVA;  Surgeon: Nadara Mustard, MD;  Location: [facility] ORS;  Service: Gynecology;  Laterality: N/A;   FOOT SURGERY Left 11/14/2012   screw  in foot   LAPAROSCOPIC BILATERAL SALPINGECTOMY Bilateral 09/02/2021   Procedure: LAPAROSCOPIC BILATERAL SALPINGECTOMY;  Surgeon: Nadara Mustard, MD;  Location: [facility] ORS;  Service: Gynecology;  Laterality: Bilateral;   TUBAL LIGATION     WOUND DEBRIDEMENT Right 12/2012   inner thigh with drain ( spider bite MRSA)    Family History  Problem Relation Age of Onset   Arthritis Father    Hyperlipidemia Father    Hypertension Father    Mental illness Father    Diabetes Father    Hyperlipidemia Maternal Grandmother    Arthritis Maternal Grandmother    Breast cancer Maternal Grandmother    Hypertension Maternal Grandmother    Mental illness Maternal Grandmother    Diabetes Maternal Grandmother    Cancer  Maternal Grandmother 50       Breast   Cancer - Other Maternal Grandmother        bile duct   Arthritis Maternal Grandfather    Mental illness Paternal Grandmother    Hypertension Paternal Grandmother    Arthritis Paternal Grandmother    Arthritis Paternal Grandfather    Mental illness Paternal Grandfather    Hypertension Paternal Grandfather     Allergies  Allergen Reactions   Vancomycin Swelling    Face and throat   Lamotrigine Hives   Septra [Sulfamethoxazole-Trimethoprim] Rash    Current Outpatient Medications on File Prior to Visit  Medication Sig Dispense Refill   albuterol (VENTOLIN HFA) 108 (90 Base) MCG/ACT inhaler Inhale 2 puffs into the lungs every 6 (six) hours as needed for shortness of breath. 1 each 0   busPIRone (BUSPAR) 5 MG tablet TAKE 1 TABLET (5 MG TOTAL) BY MOUTH 2 (TWO) TIMES DAILY. FOR ANXIETY. 180 tablet 2   cetirizine (ZYRTEC) 10 MG tablet Take 10 mg by mouth in the morning.     citalopram (CELEXA) 40 MG tablet Take 1 tablet (40 mg total) by mouth daily. For anxiety and depression. 90 tablet 3   No current facility-administered medications on file prior to visit.    Pulse 67   Temp 98.1 F (36.7 C) (Temporal)   Ht 5\' 1"  (1.549 m)   Wt 221 lb (100.2 kg)   SpO2 91%   BMI 41.76 kg/m  Objective:   Physical Exam Constitutional:      Appearance: She is ill-appearing.  HENT:     Right Ear: Tympanic membrane and ear canal normal.     Left Ear: Tympanic membrane and ear canal normal.     Nose:     Right Sinus: No maxillary sinus tenderness or frontal sinus tenderness.     Left Sinus: No maxillary sinus tenderness or frontal sinus tenderness.     Mouth/Throat:     Pharynx: No posterior oropharyngeal erythema.  Eyes:     Conjunctiva/sclera: Conjunctivae normal.  Cardiovascular:     Rate and Rhythm: Normal rate and regular rhythm.  Pulmonary:     Effort: Pulmonary effort is normal.     Breath sounds: Examination of the left-upper field reveals  wheezing. Examination of the left-lower field reveals wheezing. Wheezing present. No rales.  Musculoskeletal:     Cervical back: Neck supple.  Lymphadenopathy:     Cervical: No cervical adenopathy.  Skin:    General: Skin is warm and dry.           Assessment & Plan:   Problem List Items Addressed This Visit       Other   COVID-19 virus infection - Primary  Positive Covid-19 test today. Negative influenza.  Discussed options for treatment. Given her asthma history we decided to move forward with antiviral treatment.  Start Paxlovid BID x 5 days. Start Benzonatate capsules Take 1 capsule by mouth three times daily as needed for cough. Start prednisone 20 mg tablets. Take 2 tablets by mouth once daily in the morning for 5 days. Rx for Cheratussin sent to pharmacy to take HS PRN.  Continue albuterol inhaler PRN  Return precautions provided.        Relevant Medications   nirmatrelvir/ritonavir EUA (PAXLOVID) 20 x 150 MG & 10 x 100MG  TABS   predniSONE (DELTASONE) 20 MG tablet   benzonatate (TESSALON) 200 MG capsule   guaiFENesin-codeine (ROBITUSSIN AC) 100-10 MG/5ML syrup   Other Visit Diagnoses     Acute cough       Relevant Medications   benzonatate (TESSALON) 200 MG capsule   guaiFENesin-codeine (ROBITUSSIN AC) 100-10 MG/5ML syrup   Other Relevant Orders   POC COVID-19 (Completed)   Influenza A/B (Completed)   POCT rapid strep A (Completed)          , NP

## 2022-10-24 NOTE — Patient Instructions (Signed)
You may take Benzonatate capsules for cough. Take 1 capsule by mouth three times daily as needed for cough.  Start prednisone 20 mg tablets. Take 2 tablets by mouth once daily in the morning for 5 days.  Start Paxlovid. Take 3 capsules by mouth twice daily for 5 days.  You may take the cough suppressant at bedtime as needed for cough and rest. Caution this medication contains codeine which may cause drowsiness.   It was a pleasure to see you today!

## 2022-10-24 NOTE — Assessment & Plan Note (Signed)
Positive Covid-19 test today. Negative influenza.  Discussed options for treatment. Given her asthma history we decided to move forward with antiviral treatment.  Start Paxlovid BID x 5 days. Start Benzonatate capsules Take 1 capsule by mouth three times daily as needed for cough. Start prednisone 20 mg tablets. Take 2 tablets by mouth once daily in the morning for 5 days. Rx for Cheratussin sent to pharmacy to take HS PRN.  Continue albuterol inhaler PRN  Return precautions provided.

## 2022-11-07 ENCOUNTER — Telehealth: Payer: BC Managed Care – PPO | Admitting: Family Medicine

## 2022-11-07 DIAGNOSIS — R3989 Other symptoms and signs involving the genitourinary system: Secondary | ICD-10-CM

## 2022-11-07 NOTE — Progress Notes (Signed)
[place]   Needs to be seen in person given fevers with UTI symptoms.

## 2022-11-17 ENCOUNTER — Encounter: Payer: Self-pay | Admitting: Family Medicine

## 2022-11-17 ENCOUNTER — Ambulatory Visit: Payer: BC Managed Care – PPO | Admitting: Family Medicine

## 2022-11-17 VITALS — BP 118/72 | HR 62 | Temp 97.5°F | Ht 61.0 in | Wt 216.4 lb

## 2022-11-17 DIAGNOSIS — R609 Edema, unspecified: Secondary | ICD-10-CM | POA: Diagnosis not present

## 2022-11-17 DIAGNOSIS — R319 Hematuria, unspecified: Secondary | ICD-10-CM

## 2022-11-17 DIAGNOSIS — N12 Tubulo-interstitial nephritis, not specified as acute or chronic: Secondary | ICD-10-CM

## 2022-11-17 DIAGNOSIS — R600 Localized edema: Secondary | ICD-10-CM

## 2022-11-17 HISTORY — DX: Hematuria, unspecified: R31.9

## 2022-11-17 HISTORY — DX: Localized edema: R60.0

## 2022-11-17 HISTORY — DX: Tubulo-interstitial nephritis, not specified as acute or chronic: N12

## 2022-11-17 LAB — POC URINALSYSI DIPSTICK (AUTOMATED)
Bilirubin, UA: NEGATIVE
Blood, UA: NEGATIVE
Glucose, UA: NEGATIVE
Ketones, UA: NEGATIVE
Leukocytes, UA: NEGATIVE
Nitrite, UA: NEGATIVE
Protein, UA: NEGATIVE
Spec Grav, UA: 1.025 (ref 1.010–1.025)
Urobilinogen, UA: 0.2 E.U./dL
pH, UA: 6 (ref 5.0–8.0)

## 2022-11-17 LAB — POCT UA - MICROSCOPIC ONLY

## 2022-11-17 NOTE — Progress Notes (Signed)
Patient ID: Katherine Ruiz, female    DOB: 05-Jul-1991, 32 y.o.   MRN: [id number]  This visit was conducted in person.  BP 118/72 (BP Location: Left Arm, Patient Position: Sitting, Cuff Size: Large)   Pulse 62   Temp (!) 97.5 F (36.4 C) (Temporal)   Ht 5\' 1"  (1.549 m)   Wt 216 lb 6 oz (98.1 kg)   BMI 40.88 kg/m    CC:  Chief Complaint  Patient presents with   Follow up on UTI    hematuria   hand and feet Swelling    Subjective:   HPI: Katherine Ruiz is a 32 y.o. female with obesity presenting on 11/17/2022 for Follow up on UTI (hematuria) and hand and feet Swelling  She reports she was seen  by [facility] (decreased UOP, trickling, dysuria, frequency).. treated with keflex.  Was having left flank pain. No improvement and new blood in urine so [place]state urgent care 12/30 and diagnosed with urinary tract infection/ kidney infection.  No fever. Sent for culture.   Treated with IM med possibly Rocephin.. placed Macrobid and Augmentin (on day 6/10). She continues to have blood in her urine  intermittently.  Still mild left flank pain.  She has noticed hand and foot swelling in the last  No change in activity.  No excessive  salt intake. No ETOH.     Relevant past medical, surgical, family and social history reviewed and updated as indicated. Interim medical history since our last visit reviewed. Allergies and medications reviewed and updated. Outpatient Medications Prior to Visit  Medication Sig Dispense Refill   albuterol (VENTOLIN HFA) 108 (90 Base) MCG/ACT inhaler Inhale 2 puffs into the lungs every 6 (six) hours as needed for shortness of breath. 1 each 0   amoxicillin-clavulanate (AUGMENTIN) 875-125 MG tablet Take 1 tablet by mouth 2 (two) times daily.     busPIRone (BUSPAR) 5 MG tablet TAKE 1 TABLET (5 MG TOTAL) BY MOUTH 2 (TWO) TIMES DAILY. FOR ANXIETY. 180 tablet 2   cetirizine (ZYRTEC) 10 MG tablet Take 10 mg by mouth in the morning.     citalopram  (CELEXA) 40 MG tablet Take 1 tablet (40 mg total) by mouth daily. For anxiety and depression. 90 tablet 3   nitrofurantoin, macrocrystal-monohydrate, (MACROBID) 100 MG capsule Take 100 mg by mouth every 12 (twelve) hours.     benzonatate (TESSALON) 200 MG capsule Take 1 capsule (200 mg total) by mouth 3 (three) times daily as needed for cough. 15 capsule 0   guaiFENesin-codeine (ROBITUSSIN AC) 100-10 MG/5ML syrup Take 5 mLs by mouth at bedtime as needed for cough. 50 mL 0   predniSONE (DELTASONE) 20 MG tablet Take 2 tablets by mouth once daily in the morning for 5 days. 10 tablet 0   No facility-administered medications prior to visit.     Per HPI unless specifically indicated in ROS section below Review of Systems Objective:  BP 118/72 (BP Location: Left Arm, Patient Position: Sitting, Cuff Size: Large)   Pulse 62   Temp (!) 97.5 F (36.4 C) (Temporal)   Ht 5\' 1"  (1.549 m)   Wt 216 lb 6 oz (98.1 kg)   BMI 40.88 kg/m   Wt Readings from Last 3 Encounters:  11/17/22 216 lb 6 oz (98.1 kg)  10/24/22 221 lb (100.2 kg)  06/01/22 212 lb (96.2 kg)      Physical Exam    Results for orders placed or performed in visit on  10/24/22  POC COVID-19  Result Value Ref Range   SARS Coronavirus 2 Ag Positive (A) Negative  Influenza A/B  Result Value Ref Range   Influenza A, POC Negative Negative   Influenza B, POC Negative Negative  POCT rapid strep A  Result Value Ref Range   Rapid Strep A Screen Negative Negative     COVID 19 screen:  No recent travel or known exposure to COVID19 The patient denies respiratory symptoms of COVID 19 at this time. The importance of social distancing was discussed today.   Assessment and Plan    Problem List Items Addressed This Visit     Hematuria - Primary    She states she feels she has seen some hematuria intermittently since starting the antibiotics.  She has not completed the antibiotic course yet.  Urinalysis appears clear today. Under  microscope there was no sign of red blood cell casts.      Relevant Orders   POCT Urinalysis Dipstick (Automated) (Completed)   POCT UA - Microscopic Only (Completed)   Peripheral edema    Will check kidney function given peripheral edema in setting of possible intermittent hematuria. Will also check for other secondary causes of peripheral edema.  No clear cardiopulmonary cause on exam seen today. No clear lifestyle change but patient with obesity which could contribute to peripheral edema.  She denies excess salt or alcohol use. Not likely secondary to antibiotics given the symptoms started before her symptoms started.      Relevant Orders   CBC with Differential/Platelet   Comprehensive metabolic panel   TSH   Brain natriuretic peptide   Pyelonephritis    Acute, questionable diagnosis of pyelonephritis.  Patient did have flank pain but no fever.  She will obtain urine culture with antibiotic sensitivities from the clinic in [place].  She has 4 more days of Macrobid and Augmentin to complete.  She will continue these along with pushing fluids.  Return and ER precautions provided.  If any fever on antibiotics she is to notify us immediately.      Relevant Medications   nitrofurantoin, macrocrystal-monohydrate, (MACROBID) 100 MG capsule     Eliezer Lofts, MD

## 2022-11-17 NOTE — Assessment & Plan Note (Addendum)
She states she feels she has seen some hematuria intermittently since starting the antibiotics.  She has not completed the antibiotic course yet.  Urinalysis appears clear today. Under microscope there was no sign of red blood cell casts.

## 2022-11-17 NOTE — Assessment & Plan Note (Signed)
Will check kidney function given peripheral edema in setting of possible intermittent hematuria. Will also check for other secondary causes of peripheral edema.  No clear cardiopulmonary cause on exam seen today. No clear lifestyle change but patient with obesity which could contribute to peripheral edema.  She denies excess salt or alcohol use. Not likely secondary to antibiotics given the symptoms started before her symptoms started.

## 2022-11-17 NOTE — Patient Instructions (Addendum)
Contact urgent care in [place] to obtain urine culture results.  Let me know what culture results showed antibiotic sensitivities. Continue antibiotics.  Please stop at the lab to have labs drawn.  Elevate your feet above your heart  as able.

## 2022-11-17 NOTE — Assessment & Plan Note (Signed)
Acute, questionable diagnosis of pyelonephritis.  Patient did have flank pain but no fever.  She will obtain urine culture with antibiotic sensitivities from the clinic in [place].  She has 4 more days of Macrobid and Augmentin to complete.  She will continue these along with pushing fluids.  Return and ER precautions provided.  If any fever on antibiotics she is to notify us immediately.

## 2022-11-18 ENCOUNTER — Other Ambulatory Visit: Payer: Self-pay | Admitting: Family Medicine

## 2022-11-18 DIAGNOSIS — R7989 Other specified abnormal findings of blood chemistry: Secondary | ICD-10-CM

## 2022-11-18 LAB — COMPREHENSIVE METABOLIC PANEL
ALT: 207 U/L — ABNORMAL HIGH (ref 0–35)
AST: 96 U/L — ABNORMAL HIGH (ref 0–37)
Albumin: 4.6 g/dL (ref 3.5–5.2)
Alkaline Phosphatase: 38 U/L — ABNORMAL LOW (ref 39–117)
BUN: 10 mg/dL (ref 6–23)
CO2: 29 mEq/L (ref 19–32)
Calcium: 9.5 mg/dL (ref 8.4–10.5)
Chloride: 101 mEq/L (ref 96–112)
Creatinine, Ser: 0.9 mg/dL (ref 0.40–1.20)
GFR: 83.93 mL/min (ref >60.00)
Glucose, Bld: 80 mg/dL (ref 70–99)
Potassium: 4.4 mEq/L (ref 3.5–5.1)
Sodium: 138 mEq/L (ref 135–145)
Total Bilirubin: 0.4 mg/dL (ref 0.2–1.2)
Total Protein: 6.9 g/dL (ref 6.0–8.3)

## 2022-11-18 LAB — TSH: TSH: 1.93 u[IU]/mL (ref 0.35–5.50)

## 2022-11-18 LAB — BRAIN NATRIURETIC PEPTIDE: Pro B Natriuretic peptide (BNP): 22 pg/mL (ref 0.0–100.0)

## 2022-11-18 NOTE — Progress Notes (Signed)
Called patient at 6 PM November 18, 2022.  She has not yet received any records or urine culture results from her urgent care visit in [place].  Given that I believe her pyelonephritis diagnosis was questionable as she never had fever and also given the fact that her urinalysis when I saw her 2 days ago was completely normal.  I will have her go ahead and stop the Macrobid as well as the Augmentin since they could be both causing liver irritation and possible swelling.  She will come in early next week for repeat of liver function test.  If needed we can move forward with ultrasound of gallbladder if it is not improving.  She will push fluids.  She will go to urgent care or emergency room over the weekend if she has fever or worsening urinary symptoms off antibiotics.

## 2022-11-21 ENCOUNTER — Emergency Department
Admission: EM | Admit: 2022-11-21 | Discharge: 2022-11-21 | Disposition: A | Discharge status: Home / Self Care | Payer: BC Managed Care – PPO | Attending: Emergency Medicine | Admitting: Emergency Medicine

## 2022-11-21 ENCOUNTER — Other Ambulatory Visit (INDEPENDENT_AMBULATORY_CARE_PROVIDER_SITE_OTHER): Payer: BC Managed Care – PPO

## 2022-11-21 ENCOUNTER — Other Ambulatory Visit: Payer: Self-pay

## 2022-11-21 ENCOUNTER — Encounter: Payer: Self-pay | Admitting: Family Medicine

## 2022-11-21 DIAGNOSIS — R609 Edema, unspecified: Secondary | ICD-10-CM

## 2022-11-21 DIAGNOSIS — K521 Toxic gastroenteritis and colitis: Secondary | ICD-10-CM | POA: Diagnosis not present

## 2022-11-21 DIAGNOSIS — R7989 Other specified abnormal findings of blood chemistry: Secondary | ICD-10-CM | POA: Diagnosis not present

## 2022-11-21 DIAGNOSIS — T65891A Toxic effect of other specified substances, accidental (unintentional), initial encounter: Secondary | ICD-10-CM | POA: Diagnosis not present

## 2022-11-21 DIAGNOSIS — R197 Diarrhea, unspecified: Secondary | ICD-10-CM | POA: Diagnosis present

## 2022-11-21 LAB — COMPREHENSIVE METABOLIC PANEL
ALT: 105 U/L — ABNORMAL HIGH (ref 0–44)
AST: 33 U/L (ref 15–41)
Albumin: 4.4 g/dL (ref 3.5–5.0)
Alkaline Phosphatase: 41 U/L (ref 38–126)
Anion gap: 10 (ref 5–15)
BUN: 11 mg/dL (ref 6–20)
CO2: 23 mmol/L (ref 22–32)
Calcium: 9.2 mg/dL (ref 8.9–10.3)
Chloride: 103 mmol/L (ref 98–111)
Creatinine, Ser: 0.78 mg/dL (ref 0.44–1.00)
GFR, Estimated: >60 mL/min (ref >60)
Glucose, Bld: 90 mg/dL (ref 70–99)
Potassium: 3.7 mmol/L (ref 3.5–5.1)
Sodium: 136 mmol/L (ref 135–145)
Total Bilirubin: 0.7 mg/dL (ref 0.3–1.2)
Total Protein: 7 g/dL (ref 6.5–8.1)

## 2022-11-21 LAB — CBC WITH DIFFERENTIAL/PLATELET
Basophils Absolute: 0.1 10*3/uL (ref 0.0–0.1)
Basophils Relative: 1.2 % (ref 0.0–3.0)
Eosinophils Absolute: 0.7 10*3/uL (ref 0.0–0.7)
Eosinophils Relative: 9.3 % — ABNORMAL HIGH (ref 0.0–5.0)
HCT: 39.4 % (ref 36.0–46.0)
Hemoglobin: 13.4 g/dL (ref 12.0–15.0)
Lymphocytes Relative: 32.3 % (ref 12.0–46.0)
Lymphs Abs: 2.5 10*3/uL (ref 0.7–4.0)
MCHC: 34.1 g/dL (ref 30.0–36.0)
MCV: 96.7 fl (ref 78.0–100.0)
Monocytes Absolute: 0.6 10*3/uL (ref 0.1–1.0)
Monocytes Relative: 8.1 % (ref 3.0–12.0)
Neutro Abs: 3.8 10*3/uL (ref 1.4–7.7)
Neutrophils Relative %: 49.1 % (ref 43.0–77.0)
Platelets: 311 10*3/uL (ref 150.0–400.0)
RBC: 4.07 Mil/uL (ref 3.87–5.11)
RDW: 13 % (ref 11.5–15.5)
WBC: 7.7 10*3/uL (ref 4.0–10.5)

## 2022-11-21 LAB — HEPATIC FUNCTION PANEL
ALT: 107 U/L — ABNORMAL HIGH (ref 0–35)
AST: 31 U/L (ref 0–37)
Albumin: 4.6 g/dL (ref 3.5–5.2)
Alkaline Phosphatase: 38 U/L — ABNORMAL LOW (ref 39–117)
Bilirubin, Direct: 0.1 mg/dL (ref 0.0–0.3)
Total Bilirubin: 0.5 mg/dL (ref 0.2–1.2)
Total Protein: 7.3 g/dL (ref 6.0–8.3)

## 2022-11-21 LAB — CBC
HCT: 38.5 % (ref 36.0–46.0)
Hemoglobin: 13 g/dL (ref 12.0–15.0)
MCH: 32.3 pg (ref 26.0–34.0)
MCHC: 33.8 g/dL (ref 30.0–36.0)
MCV: 95.8 fL (ref 80.0–100.0)
Platelets: 304 10*3/uL (ref 150–400)
RBC: 4.02 MIL/uL (ref 3.87–5.11)
RDW: 12.3 % (ref 11.5–15.5)
WBC: 8.1 10*3/uL (ref 4.0–10.5)
nRBC: 0 % (ref 0.0–0.2)

## 2022-11-21 LAB — LIPASE, BLOOD: Lipase: 39 U/L (ref 11–51)

## 2022-11-21 NOTE — Telephone Encounter (Signed)
Patient was sent to access nurse to be triaged due to these symptoms

## 2022-11-21 NOTE — Telephone Encounter (Signed)
Per chart review tab pt is at [facility] ED now; sending note to Gentry Fitz NP and [place] pool.

## 2022-11-21 NOTE — Discharge Instructions (Addendum)
Please use loperamide 2 mg by mouth every episode of diarrhea to a maximum dosage of 8 mg/day Please continue taking your probiotic as well as consuming at least 1 cup of yogurt containing active cultures every 24 hours for the next 7 days

## 2022-11-21 NOTE — ED Triage Notes (Signed)
Pt to ED for diarrhea, lower back pain since Thursday. Reports recent UTI. Has had elevated liver enzymes and abnormal kidney function.

## 2022-11-21 NOTE — Telephone Encounter (Signed)
Seeley Ruiz - Client TELEPHONE ADVICE RECORD [facility]urse Patient Name: Katherine Ruiz Gender: Female DOB: October 23, 1991 Age: 32 Y 73 M 19 D Return Phone Number: [phone] (Primary) Address: City/ State/ Zip: [place] [address] Client Katherine Ruiz - Client Client Site Lead - Ruiz Contact Type Call Who Is Calling Patient / Member / Family / Caregiver Call Type Triage / Clinical Relationship To Patient Self Return Phone Number [phone] (Primary) Chief Complaint SEVERE ABDOMINAL PAIN - Severe pain in abdomen Reason for Call Symptomatic / Request for Katherine Ruiz states that she has bene having really severe stomach and back pains. She is now having diarrhea every 25 minutes or so. These symptoms started all on Saturday and they are just getting worse. She has not seen any blood and she has urinated within the past 8 hours. She also has a headache. She is not able to keep anything down food nor liquid. Her doctors name is Diona Browner, Amy-MD. [place] Not Listed Nearby UC Translation No Nurse Assessment Nurse: Gildardo Pounds, RN, Amy Date/Time (Eastern Time): 11/21/2022 12:30:28 PM Confirm and document reason for call. If symptomatic, describe symptoms. ---Caller states that she has been having really severe lower stomach pain that wraps around to the back. She is now having diarrhea every 25 minutes or so. These symptoms started all on Saturday, and they are just getting worse. She also has a headache. She is not able to keep anything down, food nor liquid. She is not vomiting, but can't eat d/t stomach hurting. She ate some soup yesterday, & she immediately had to go the bathroom. No fever. Over Xmas, she had UTI, put on Keflex, After 2 days of being on that, she started having bleeding in urine. Put on Augmentin & Cipro, given shot of antibiotic as well. She  developed swelling in legs & ankles. Her PCP did blood work yesterday & her liver levels are super high. Had more blood work done this morning. Does the patient have any new or worsening symptoms? ---Yes Will a triage be completed? ---Yes Related visit to physician within the last 2 weeks? ---No Does the PT have any chronic conditions? (i.e. diabetes, asthma, this includes High risk factors for pregnancy, etc.) ---No PLEASE NOTE: All timestamps contained within this report are represented as [place] Standard Time. CONFIDENTIALTY NOTICE: This fax transmission is intended only for the addressee. It contains information that is legally privileged, confidential or otherwise protected from use or disclosure. If you are not the intended recipient, you are strictly prohibited from reviewing, disclosing, copying using or disseminating any of this information or taking any action in reliance on or regarding this information. If you have received this fax in error, please notify us immediately by telephone so that we can arrange for its return to [place]. Phone: [phone], Toll-Free: [phone], Fax: [phone] Page: 2 of 2 Call Id: [id number] Nurse Assessment Is the patient pregnant or possibly pregnant? (Ask all females between the ages of 12-55) ---No Is this a behavioral health or substance abuse call? ---No Guidelines Guideline Title Affirmed Question Affirmed Notes Nurse Date/Time Eilene Ghazi Time) Abdominal Pain - Female [1] MILDMODERATE pain AND [2] constant AND [3] present > 2 hours Lovelace, RN, Amy 11/21/2022 12:31:47 PM Disp. Time Eilene Ghazi Time) Disposition Final User 11/21/2022 12:28:56 PM Send to Urgent Queue Minna Merritts 11/21/2022 12:38:32 PM See HCP within 4 Hours (or PCP triage) Yes Lovelace, RN, Amy Final Disposition 11/21/2022  12:38:32 PM See HCP within 4 Hours (or PCP triage) Yes Lovelace, RN, Amy Caller Disagree/Comply Comply Caller Understands Yes PreDisposition  InappropriateToAsk Care Advice Given Per Guideline SEE HCP (OR PCP TRIAGE) WITHIN 4 HOURS: * IF OFFICE WILL BE OPEN: You need to be seen within the next 3 or 4 hours. Call your doctor (or NP/PA) now or as soon as the office opens. REST: * Lie down. * Rest until you are seen. NOTHING BY MOUTH: * Do not eat or drink anything for now. CALL BACK IF: * You become worse CARE ADVICE given per Abdominal Pain - Female (Adult) guideline. Comments User: Lutricia Horsfall, RN Date/Time Lamount Cohen Time): 11/21/2022 12:40:49 PM Spoke to [place] at the back line. No available appts in the next 4 hours, so advised caller to go to UC or ER. Referrals GO TO FACILITY OTHER - [facility]

## 2022-11-21 NOTE — ED Provider Notes (Addendum)
[facility] Provider Note   Event Date/Time   First MD Initiated Contact with Patient 11/21/22 1518     (approximate) History  Diarrhea  HPI Katherine Ruiz is a 32 y.o. female with stated past medical history of recent urinary tract infection with multiple antibiotics for symptom improvement including Keflex, Augmentin, and Macrobid within the last 2 weeks.  Patient endorses multiple episodes of diarrhea per day over the last 4 days that has not improved.  Patient states that she is taking a probiotic but has not taken any antidiarrheal medications.  Patient denies any recent or sick contacts ROS: Patient currently denies any vision changes, tinnitus, difficulty speaking, facial droop, sore throat, chest pain, shortness of breath, abdominal pain, nausea/vomiting, dysuria, or weakness/numbness/paresthesias in any extremity   Physical Exam  Triage Vital Signs: ED Triage Vitals  Enc Vitals Group     BP 11/21/22 1254 (!) 130/93     Pulse Rate 11/21/22 1254 69     Resp 11/21/22 1254 16     Temp 11/21/22 1254 97.9 F (36.6 C)     Temp src --      SpO2 11/21/22 1254 97 %     Weight 11/21/22 1255 216 lb 0.8 oz (98 kg)     Height 11/21/22 1255 5\' 1"  (1.549 m)     Head Circumference --      Peak Flow --      Pain Score 11/21/22 1254 8     Pain Loc --      Pain Edu? --      Excl. in [place]? --    Most recent vital signs: Vitals:   11/21/22 1254 11/21/22 1726  BP: (!) 130/93 129/88  Pulse: 69 71  Resp: 16 16  Temp: 97.9 F (36.6 C)   SpO2: 97% 98%   General: Awake, oriented x4. CV:  Good peripheral perfusion.  Resp:  Normal effort.  Abd:  No distention.  Other:  Obese Caucasian middle-aged female laying in bed in no acute distress ED Results / Procedures / Treatments  Labs (all labs ordered are listed, but only abnormal results are displayed) Labs Reviewed  COMPREHENSIVE METABOLIC PANEL - Abnormal; Notable for the following components:      Result Value    ALT 105 (*)    All other components within normal limits  LIPASE, BLOOD  CBC  URINALYSIS, ROUTINE W REFLEX MICROSCOPIC  PROCEDURES: Critical Care performed: No Procedures MEDICATIONS ORDERED IN ED: Medications - No data to display IMPRESSION / MDM / [place] / ED COURSE  I reviewed the triage vital signs and the nursing notes.                              Patient's presentation is most consistent with acute presentation with potential threat to life or bodily function. This patient presents with diarrhea consistent with likely viral enteritis. Doubt acute bacterial diarrhea. Considered, but think unlikely, partial SBO, appendicitis, diverticulitis, other intraabdominal infection. Low suspicion for secondary causes of diarrhea such as hyperadrenergic state, pheo, adrenal crisis, hyperthyroidism, or sepsis. Doubt antibiotic associated diarrhea.  Plan: PO rehydration, reassess, discharge with OTC antidiarrheal meds//short course antibiotics Despite her complaints of diarrhea, patient does not have any leukocytosis as well as has not had a bowel movement within her emergency department course and therefore I have low suspicion for C. difficile at this time. Dispo: Discharge home with PCP follow-up and  strict return precautions   FINAL CLINICAL IMPRESSION(S) / ED DIAGNOSES   Final diagnoses:  Antibiotic-associated diarrhea   Rx / DC Orders   ED Discharge Orders     None      Note:  This document was prepared using Dragon voice recognition software and may include unintentional dictation errors.   Merwyn Katos, MD 11/21/22 1737    Merwyn Katos, MD 11/21/22 [phone]

## 2022-11-21 NOTE — ED Notes (Signed)
E signature pad not working. Pt educated on discharge instructions and verbalized understanding.  

## 2022-11-21 NOTE — ED Provider Triage Note (Signed)
Emergency Medicine Provider Triage Evaluation Note  Katherine Ruiz , a 32 y.o. female  was evaluated in triage.  Pt complains of back pain, abdominal pain, elevated liver enzymes, recent UTI.  Review of Systems  Positive:  Negative:   Physical Exam  BP (!) 130/93   Pulse 69   Temp 97.9 F (36.6 C)   Resp 16   Ht 5\' 1"  (1.549 m)   Wt 98 kg   SpO2 97%   BMI 40.82 kg/m  Gen:   Awake, no distress   Resp:  Normal effort  MSK:   Moves extremities without difficulty  Other:    Medical Decision Making  Medically screening exam initiated at 12:56 PM.  Appropriate orders placed.  Katherine Ruiz was informed that the remainder of the evaluation will be completed by another provider, this initial triage assessment does not replace that evaluation, and the importance of remaining in the ED until their evaluation is complete.     Versie Starks, PA-C 11/21/22 1256

## 2022-11-22 ENCOUNTER — Telehealth: Payer: Self-pay

## 2022-11-22 NOTE — Telephone Encounter (Signed)
Transition Care Management Follow-up Telephone Call Date of discharge and from where: [facility] 11/21/2022 How have you been since you were released from the hospital? Some better, but weak Any questions or concerns? No  Items Reviewed: Did the pt receive and understand the discharge instructions provided? Yes  Medications obtained and verified? Yes  Other? No  Any new allergies since your discharge? No  Dietary orders reviewed? Yes Do you have support at home? Yes   Home Care and Equipment/Supplies: Were home health services ordered? no If so, what is the name of the agency? N/a  Has the agency set up a time to come to the patient's home? not applicable Were any new equipment or medical supplies ordered?  No What is the name of the medical supply agency? N/a Were you able to get the supplies/equipment? not applicable Do you have any questions related to the use of the equipment or supplies? No  Functional Questionnaire: (I = Independent and D = Dependent) ADLs: I  Bathing/Dressing- I  Meal Prep- I  Eating- I  Maintaining continence- I  Transferring/Ambulation- I  Managing Meds- I  Follow up appointments reviewed:  PCP Hospital f/u appt confirmed? Yes  Scheduled to see Alma Friendly on 11/30/2022 @ 9:40. [facility] f/u appt confirmed? No   Are transportation arrangements needed? No  If their condition worsens, is the pt aware to call PCP or go to the Emergency Dept.? Yes Was the patient provided with contact information for the PCP's office or ED? Yes Was to pt encouraged to call back with questions or concerns? Yes Juanda Crumble, LPN [facility] Direct Dial [phone]

## 2022-11-30 ENCOUNTER — Encounter: Payer: Self-pay | Admitting: Primary Care

## 2022-11-30 ENCOUNTER — Ambulatory Visit: Payer: BC Managed Care – PPO | Admitting: Primary Care

## 2022-11-30 VITALS — BP 132/80 | HR 64 | Temp 98.1°F | Ht 61.0 in | Wt 219.0 lb

## 2022-11-30 DIAGNOSIS — R3915 Urgency of urination: Secondary | ICD-10-CM

## 2022-11-30 DIAGNOSIS — R7989 Other specified abnormal findings of blood chemistry: Secondary | ICD-10-CM

## 2022-11-30 DIAGNOSIS — F4323 Adjustment disorder with mixed anxiety and depressed mood: Secondary | ICD-10-CM

## 2022-11-30 HISTORY — DX: Other specified abnormal findings of blood chemistry: R79.89

## 2022-11-30 LAB — POC URINALSYSI DIPSTICK (AUTOMATED)
Bilirubin, UA: NEGATIVE
Glucose, UA: NEGATIVE
Ketones, UA: NEGATIVE
Leukocytes, UA: NEGATIVE
Nitrite, UA: NEGATIVE
Protein, UA: NEGATIVE
Spec Grav, UA: 1.02 (ref 1.010–1.025)
Urobilinogen, UA: NEGATIVE E.U./dL — AB
pH, UA: 6 (ref 5.0–8.0)

## 2022-11-30 LAB — HEPATIC FUNCTION PANEL
ALT: 26 U/L (ref 0–35)
AST: 16 U/L (ref 0–37)
Albumin: 4.6 g/dL (ref 3.5–5.2)
Alkaline Phosphatase: 39 U/L (ref 39–117)
Bilirubin, Direct: 0.1 mg/dL (ref 0.0–0.3)
Total Bilirubin: 0.3 mg/dL (ref 0.2–1.2)
Total Protein: 7.3 g/dL (ref 6.0–8.3)

## 2022-11-30 LAB — URINALYSIS, MICROSCOPIC ONLY
RBC / HPF: NONE SEEN (ref 0–?)
WBC, UA: NONE SEEN (ref 0–?)

## 2022-11-30 MED ORDER — SERTRALINE HCL 50 MG PO TABS: 50.0000 mg | ORAL_TABLET | Freq: Every day | ORAL | 0 refills | Status: DC

## 2022-11-30 NOTE — Assessment & Plan Note (Signed)
Deteriorated.   Current regimen ineffective.   Long discussion regarding options.  Stop citalopram 40 mg. Start Zoloft 50 mg daily. Continue Buspar 5 mg BID.  Follow up in 4 weeks.

## 2022-11-30 NOTE — Assessment & Plan Note (Addendum)
Improving.  UA today with trace blood. Urine microscopic and culture pending. Increase water intake.  Continue to monitor.

## 2022-11-30 NOTE — Progress Notes (Signed)
Subjective:    Patient ID: Katherine Ruiz, female    DOB: 04-Feb-1991, 32 y.o.   MRN: [id number]  HPI  Katherine Ruiz is a very pleasant 32 y.o. female  has a past medical history of Anxiety, Arthritis, Asthma, Cervical radiculopathy (09/01/2018), Chest pain (06/01/2022), Chickenpox, COVID-19 virus infection (10/24/2022), Depression, Family history of breast cancer, Hematuria (11/17/2022), History of kidney stones, History of MRSA infection (10/17/2014), History of UTI, MRSA (methicillin resistant staph aureus) culture positive (2004), Muscle spasm (03/29/2022), Neck pain (03/29/2022), Pyelonephritis (11/17/2022), S/P endometrial ablation (09/15/2021), and S/P tubal ligation (09/15/2021). who presents today for ED follow up and to discuss anxiety/depression.  1) ED Follow: Evaluated by Dr. Diona Browner on 11/17/22 for follow-up of hematuria/cystitis.  She was evaluated through an e-visit for UTI symptoms, treated with Keflex course.  She then went out of state and was treated at an urgent care in [place] on 11/12/2022, provided IM Rocephin and treated with Macrobid and Augmentin.  During her follow-up visit with Dr. Diona Browner she continued to notice intermittent hematuria.  Labs were negative for red blood cells within her urinalysis, however her liver enzymes were elevated so all antibiotics were discontinued.  She presented to [facility] ED on 11/21/2022 for a 4-day history of multiple episodes of diarrhea without improvement.  Recently treated with multiple antibiotics for cystitis and suspected pyelonephritis including Keflex, Augmentin, Macrobid within a 2-week period.  During her stay in the ED she was treated with oral fluids. Labs were grossly negative. She was unable to provide a stool specimen.  She was discharged home later that evening and diagnosed with viral enteritis.   Since her discharge home she continues to experience diarrhea which has improved in terms of frequency reduction and is more  formed. She's experiencing diarrhea 1-2 times daily, previously she had diarrhea every 30 minutes. She continues to notice urinary urgency with trickling, other symptoms have resolved.   2) Anxiety and Depression: Chronic, worse since late Fall 2023. She's experienced increased personal stress at home. She's been managed on citalopram 40 mg daily and buspirone 5 mg BID, she's not sure if the Buspar is effective.   Symptoms include feeling anxious, worrying, tearfulness, feeling overwhelmed, irritability.  She's tried Zoloft post partum, isn't sure how she did. She denies SI/HI. She was never able to connect with her therapist as she never called back to schedule. She is still interested in seeing therapy.   Review of Systems  Gastrointestinal:  Negative for abdominal pain.  Genitourinary:  Positive for urgency. Negative for dysuria and hematuria.  Psychiatric/Behavioral:  Negative for suicidal ideas. The patient is nervous/anxious.        See HPI         Past Medical History:  Diagnosis Date   Anxiety    Arthritis    Asthma    Cervical radiculopathy 09/01/2018   Chest pain 06/01/2022   Chickenpox    COVID-19 virus infection 10/24/2022   Depression    Family history of breast cancer    4/21 cancer genetic testing letter sent   Hematuria 11/17/2022   History of kidney stones    History of MRSA infection 10/17/2014   Formatting of this note might be different from the original. Hospitalized at [facility] 08/2014. I+D, IV antibiotics   History of UTI    MRSA (methicillin resistant staph aureus) culture positive 2004   spider bite   Muscle spasm 03/29/2022   Neck pain 03/29/2022   Pyelonephritis 11/17/2022   S/P  endometrial ablation 09/15/2021   S/P tubal ligation 09/15/2021    Social History   Socioeconomic History   Marital status: Married    Spouse name: Not on file   Number of children: Not on file   Years of education: Not on file   Highest education level:  Not on file  Occupational History   Not on file  Tobacco Use   Smoking status: Former    Types: Cigarettes    Quit date: 10/04/2012    Years since quitting: 10.1   Smokeless tobacco: Never  Vaping Use   Vaping Use: Never used  Substance and Sexual Activity   Alcohol use: Yes    Comment: occasionally   Drug use: Yes    Types: Marijuana   Sexual activity: Not Currently  Other Topics Concern   Not on file  Social History Narrative   Married   2 children born 2010 and 2011, both girls   Works as an Optometrist for an [employer]   Enjoys walking, running, spending time with her family   12/22/2016   Social Determinants of Health   Financial Resource Strain: Not on file  Food Insecurity: Not on file  Transportation Needs: Not on file  Physical Activity: Not on file  Stress: Not on file  Social Connections: Not on file  Intimate Partner Violence: Not on file    Past Surgical History:  Procedure Laterality Date   DILATATION AND CURETTAGE/HYSTEROSCOPY WITH MINERVA N/A 09/02/2021   Procedure: DILATATION AND CURETTAGE/HYSTEROSCOPY WITH MINERVA;  Surgeon: Gae Dry, MD;  Location: [facility] ORS;  Service: Gynecology;  Laterality: N/A;   FOOT SURGERY Left 11/14/2012   screw in foot   LAPAROSCOPIC BILATERAL SALPINGECTOMY Bilateral 09/02/2021   Procedure: LAPAROSCOPIC BILATERAL SALPINGECTOMY;  Surgeon: Gae Dry, MD;  Location: [facility] ORS;  Service: Gynecology;  Laterality: Bilateral;   TUBAL LIGATION     WOUND DEBRIDEMENT Right 12/2012   inner thigh with drain ( spider bite MRSA)    Family History  Problem Relation Age of Onset   Arthritis Father    Hyperlipidemia Father    Hypertension Father    Mental illness Father    Diabetes Father    Hyperlipidemia Maternal Grandmother    Arthritis Maternal Grandmother    Breast cancer Maternal Grandmother    Hypertension Maternal Grandmother    Mental illness Maternal Grandmother    Diabetes Maternal Grandmother     Cancer Maternal Grandmother 35       Breast   Cancer - Other Maternal Grandmother        bile duct   Arthritis Maternal Grandfather    Mental illness Paternal Grandmother    Hypertension Paternal Grandmother    Arthritis Paternal Grandmother    Arthritis Paternal Grandfather    Mental illness Paternal Grandfather    Hypertension Paternal Grandfather     Allergies  Allergen Reactions   Vancomycin Swelling    Face and throat   Lamotrigine Hives   Septra [Sulfamethoxazole-Trimethoprim] Rash    Current Outpatient Medications on File Prior to Visit  Medication Sig Dispense Refill   albuterol (VENTOLIN HFA) 108 (90 Base) MCG/ACT inhaler Inhale 2 puffs into the lungs every 6 (six) hours as needed for shortness of breath. 1 each 0   busPIRone (BUSPAR) 5 MG tablet TAKE 1 TABLET (5 MG TOTAL) BY MOUTH 2 (TWO) TIMES DAILY. FOR ANXIETY. 180 tablet 2   cetirizine (ZYRTEC) 10 MG tablet Take 10 mg by mouth in the morning.  No current facility-administered medications on file prior to visit.    BP 132/80   Pulse 64   Temp 98.1 F (36.7 C) (Temporal)   Ht 5\' 1"  (1.549 m)   Wt 219 lb (99.3 kg)   SpO2 94%   BMI 41.38 kg/m  Objective:   Physical Exam Cardiovascular:     Rate and Rhythm: Normal rate and regular rhythm.  Pulmonary:     Effort: Pulmonary effort is normal.     Breath sounds: Normal breath sounds.  Abdominal:     Tenderness: There is no right CVA tenderness or left CVA tenderness.  Musculoskeletal:     Cervical back: Neck supple.  Skin:    General: Skin is warm and dry.  Psychiatric:        Mood and Affect: Mood normal.           Assessment & Plan:  Adjustment reaction with anxiety and depression Assessment & Plan: Deteriorated.   Current regimen ineffective.   Long discussion regarding options.  Stop citalopram 40 mg. Start Zoloft 50 mg daily. Continue Buspar 5 mg BID.  Follow up in 4 weeks.   Orders: -     Ambulatory referral to Psychology -      Sertraline HCl; Take 1 tablet (50 mg total) by mouth daily. for anxiety and depression.  Dispense: 90 tablet; Refill: 0  Elevated LFTs Assessment & Plan: Likely secondary to multiple antibiotic use. Repeat LFT's pending  Orders: -     Hepatic function panel -     Hepatic function panel  Urinary urgency Assessment & Plan: Improving.  UA today with trace blood. Urine microscopic and culture pending. Increase water intake.  Continue to monitor.   Orders: -     POCT Urinalysis Dipstick (Automated) -     Urine Culture -     Urine Microscopic        , NP

## 2022-11-30 NOTE — Addendum Note (Signed)
Addended by: Ellamae Sia on: 11/30/2022 10:54 AM   Modules accepted: Orders

## 2022-11-30 NOTE — Assessment & Plan Note (Signed)
Likely secondary to multiple antibiotic use. Repeat LFT's pending

## 2022-11-30 NOTE — Patient Instructions (Signed)
Stop by the lab prior to leaving today. I will notify you of your results once received.   Stop taking citalopram for anxiety/depression.  Start Zoloft for anxiety/depression.  Continue Buspar.  Set up a follow up visit for 4 weeks.  It was a pleasure to see you today!

## 2022-12-02 ENCOUNTER — Other Ambulatory Visit: Payer: Self-pay | Admitting: Primary Care

## 2022-12-02 DIAGNOSIS — N3000 Acute cystitis without hematuria: Secondary | ICD-10-CM

## 2022-12-02 LAB — URINE CULTURE
MICRO NUMBER:: 14439086
SPECIMEN QUALITY:: ADEQUATE

## 2022-12-02 MED ORDER — NITROFURANTOIN MONOHYD MACRO 100 MG PO CAPS: 100.0000 mg | ORAL_CAPSULE | Freq: Two times a day (BID) | ORAL | 0 refills | Status: AC

## 2022-12-19 ENCOUNTER — Telehealth: Payer: Self-pay | Admitting: Primary Care

## 2022-12-19 DIAGNOSIS — J4520 Mild intermittent asthma, uncomplicated: Secondary | ICD-10-CM

## 2022-12-19 NOTE — Telephone Encounter (Signed)
Prescription Request  12/19/2022  Is this a "Controlled Substance" medicine? No  LOV: 11/30/2022  What is the name of the medication or equipment? albuterol (VENTOLIN HFA) 108 (90 Base) MCG/ACT inhaler   Have you contacted your pharmacy to request a refill? No   Which pharmacy would you like this sent to?  [id number] [place] [address] [place] [address] Phone: [phone] Fax: [phone]    Patient notified that their request is being sent to the clinical staff for review and that they should receive a response within 2 business days.   Please advise at Mobile [phone] (mobile)

## 2022-12-20 MED ORDER — ALBUTEROL SULFATE HFA 108 (90 BASE) MCG/ACT IN AERS: 2.0000 | INHALATION_SPRAY | Freq: Four times a day (QID) | RESPIRATORY_TRACT | 0 refills | Status: DC | PRN

## 2022-12-20 NOTE — Telephone Encounter (Signed)
Refills sent to pharmacy. 

## 2022-12-23 ENCOUNTER — Ambulatory Visit: Payer: BC Managed Care – PPO | Admitting: Clinical

## 2022-12-28 ENCOUNTER — Ambulatory Visit: Payer: BC Managed Care – PPO | Admitting: Primary Care

## 2022-12-28 ENCOUNTER — Encounter: Payer: Self-pay | Admitting: Primary Care

## 2022-12-28 VITALS — BP 128/82 | HR 98 | Temp 98.2°F | Ht 61.0 in | Wt 220.0 lb

## 2022-12-28 DIAGNOSIS — F4323 Adjustment disorder with mixed anxiety and depressed mood: Secondary | ICD-10-CM | POA: Diagnosis not present

## 2022-12-28 NOTE — Assessment & Plan Note (Addendum)
Uncontrolled and deteriorating.  PHQ9-17 GAD 7- 21  Increase Zoloft 50 mg to 75 mg for one week and then 100 mg thereafter.   Continue Buspar 5 mg BID.   She will reschedule her therapy appointment.   Consider changing to different class of medication if symptoms persist.   Follow up in four weeks.  I evaluated patient, was consulted regarding treatment, and agree with assessment and plan per Tinnie Gens, RN, DNP student.   Allie Bossier, NP-C

## 2022-12-28 NOTE — Patient Instructions (Signed)
Increase Zoloft to 1.5 tablets for one week and then start two tablets.   Please let us know when you are about to run out and then we can send in the 100 mg tablets.   Follow up in four weeks either virtual or face to face.   Please schedule your therapy appointment  It was a pleasure to see you today!

## 2022-12-28 NOTE — Progress Notes (Signed)
Established Patient Office Visit  Subjective   Patient ID: Katherine Ruiz, female    DOB: 1991-03-07  Age: 32 y.o. MRN: [id number]  Chief Complaint  Patient presents with   Medical Management of Chronic Issues    Anxiety and depression    HPI  Katherine Ruiz is 32 year old female with past medical history of asthma, major depressive disorder presents today for a follow up.   She was evaluated on 11/30/22  for anxiety and depression and was asked to stop citalopram 40 mg and start Zoloft 50 mg daily. She continued buspar 5 mg BID. She was interested in starting therapy and a referral for therapy.   Today, she reports that she is not doing well. She did start the Zoloft. She feels that it is ineffective. She is very tearful, emotional. She states that she can fall asleep but gets up every hour - 2 hours. She has mind racing thoughts all the time and worries a lot. She has had a lot going on. She reports that her marriage isn't going well. She went to see her dad in [place], who was diagnosed with cancer and does not want treatment. She has teenage daughters who she worries about all the time.   Her symptoms include feeling anxious, worrying, tearful, feeling overwhelmed, irritibality.   She does report getting short of breath and palpitations when she is anxious or upset about something. She has had to use her rescue inhaler multiple times daily. She denies any chest pain. Palpitations occur everyday and last for a few minutes and then resolve on its own.  She reports feeling worse than before while she was on citalopram. She was supposed to have a therapy appointment virtually last week while she was in [place]. But she reports that she never got the phone call. She is waiting to get that rescheduled.  She denies any SI and HI.       12/28/2022    8:34 AM 11/30/2022    9:58 AM 06/01/2022    7:47 AM  PHQ9 SCORE ONLY  PHQ-9 Total Score 17 17 0      12/28/2022    8:35 AM 11/30/2022     9:58 AM 04/05/2019    2:10 PM 12/19/2018    9:01 AM  GAD 7 : Generalized Anxiety Score  Nervous, Anxious, on Edge 3 3 3 3  $ Control/stop worrying 3 2 2 3  $ Worry too much - different things 3 2 2 3  $ Trouble relaxing 3 2 2 3  $ Restless 3 3 2 3  $ Easily annoyed or irritable 3 3 2 3  $ Afraid - awful might happen 3 2 0 0  Total GAD 7 Score 21 17 13 18  $ Anxiety Difficulty Extremely difficult Extremely difficult Somewhat difficult Very difficult       Patient Active Problem List   Diagnosis Date Noted   Urinary urgency 11/30/2022   Elevated LFTs 11/30/2022   Peripheral edema 11/17/2022   Adjustment reaction with anxiety and depression 12/19/2018   Asthma 08/15/2018   Major depressive disorder 11/20/2015   Past Medical History:  Diagnosis Date   Anxiety    Arthritis    Asthma    Cervical radiculopathy 09/01/2018   Chest pain 06/01/2022   Chickenpox    COVID-19 virus infection 10/24/2022   Depression    Family history of breast cancer    4/21 cancer genetic testing letter sent   Hematuria 11/17/2022   History of kidney stones  History of MRSA infection 10/17/2014   Formatting of this note might be different from the original. Hospitalized at [facility] 08/2014. I+D, IV antibiotics   History of UTI    MRSA (methicillin resistant staph aureus) culture positive 2004   spider bite   Muscle spasm 03/29/2022   Neck pain 03/29/2022   Pyelonephritis 11/17/2022   S/P endometrial ablation 09/15/2021   S/P tubal ligation 09/15/2021   Past Surgical History:  Procedure Laterality Date   DILATATION AND CURETTAGE/HYSTEROSCOPY WITH MINERVA N/A 09/02/2021   Procedure: DILATATION AND CURETTAGE/HYSTEROSCOPY WITH MINERVA;  Surgeon: Gae Dry, MD;  Location: [facility] ORS;  Service: Gynecology;  Laterality: N/A;   FOOT SURGERY Left 11/14/2012   screw in foot   LAPAROSCOPIC BILATERAL SALPINGECTOMY Bilateral 09/02/2021   Procedure: LAPAROSCOPIC BILATERAL SALPINGECTOMY;  Surgeon: Gae Dry, MD;  Location: [facility] ORS;  Service: Gynecology;  Laterality: Bilateral;   TUBAL LIGATION     WOUND DEBRIDEMENT Right 12/2012   inner thigh with drain ( spider bite MRSA)   Social History   Tobacco Use   Smoking status: Former    Types: Cigarettes    Quit date: 10/04/2012    Years since quitting: 10.2   Smokeless tobacco: Never  Vaping Use   Vaping Use: Never used  Substance Use Topics   Alcohol use: Yes    Comment: occasionally   Drug use: Yes    Types: Marijuana   Family History  Problem Relation Age of Onset   Arthritis Father    Hyperlipidemia Father    Hypertension Father    Mental illness Father    Diabetes Father    Hyperlipidemia Maternal Grandmother    Arthritis Maternal Grandmother    Breast cancer Maternal Grandmother    Hypertension Maternal Grandmother    Mental illness Maternal Grandmother    Diabetes Maternal Grandmother    Cancer Maternal Grandmother 21       Breast   Cancer - Other Maternal Grandmother        bile duct   Arthritis Maternal Grandfather    Mental illness Paternal Grandmother    Hypertension Paternal Grandmother    Arthritis Paternal Grandmother    Arthritis Paternal Grandfather    Mental illness Paternal Grandfather    Hypertension Paternal Grandfather    Allergies  Allergen Reactions   Vancomycin Swelling    Face and throat   Lamotrigine Hives   Septra [Sulfamethoxazole-Trimethoprim] Rash      Review of Systems  Constitutional:  Negative for chills and fever.  Respiratory:  Positive for shortness of breath. Negative for cough.   Cardiovascular:  Positive for palpitations. Negative for chest pain.  Neurological:  Negative for dizziness.  Psychiatric/Behavioral:  Positive for depression. The patient is nervous/anxious and has insomnia.       Objective:     BP 128/82   Pulse 98   Temp 98.2 F (36.8 C) (Temporal)   Ht 5' 1"$  (1.549 m)   Wt 220 lb (99.8 kg)   SpO2 100%   BMI 41.57 kg/m  BP Readings from  Last 3 Encounters:  12/28/22 128/82  11/30/22 132/80  11/21/22 129/88   Wt Readings from Last 3 Encounters:  12/28/22 220 lb (99.8 kg)  11/30/22 219 lb (99.3 kg)  11/21/22 216 lb 0.8 oz (98 kg)      Physical Exam Vitals and nursing note reviewed.  Constitutional:      Appearance: Normal appearance.  Cardiovascular:     Rate and Rhythm: Normal rate  and regular rhythm.     Pulses: Normal pulses.     Heart sounds: Normal heart sounds.  Pulmonary:     Effort: Pulmonary effort is normal.     Breath sounds: Normal breath sounds.  Neurological:     Mental Status: She is alert and oriented to person, place, and time.  Psychiatric:        Mood and Affect: Mood normal.     Comments: tearful      No results found for any visits on 12/28/22.     The ASCVD Risk score (Arnett DK, et al., 2019) failed to calculate for the following reasons:   The 2019 ASCVD risk score is only valid for ages 69 to 71    Assessment & Plan:   Problem List Items Addressed This Visit       Other   Adjustment reaction with anxiety and depression - Primary    Uncontrolled and deteriorating.  PHQ9-17 GAD 7- 21  Increase Zoloft 50 mg to 75 mg for one week and then 100 mg thereafter.   Continue Buspar 5 mg BID.   She will reschedule her therapy appointment.   Consider changing to different class of medication if symptoms persist.   Follow up in four weeks.       Return in about 4 weeks (around 01/25/2023) for Depression and Anxiety.    Tinnie Gens, BSN-RN, DNP STUDENT

## 2022-12-28 NOTE — Progress Notes (Signed)
Subjective:    Patient ID: Katherine Ruiz, female    DOB: 1991/08/07, 32 y.o.   MRN: [id number]  HPI  Katherine Ruiz is a very pleasant 32 y.o. female with a history of asthma, anxiety and depression who presents today for follow up of anxiety.   She was last evaluated on 11/30/22 for ED follow up and worsening anxiety/depression despite treatment with citalopram 40 mg daily and buspirone 5 mg BID. Symptoms included feeling anxious, worrying, tearfulness, feeling overwhelmed, feeling irritable. Given these symptoms we stopped citalopram 40 mg, started Zoloft 50 mg, continued buspirone 5 mg BID, and referred her to therapy.  Since her last visit she continues to experience symptoms. She doesn't feel that Zoloft is effective due to ongoing symptoms of mind racing thoughts, tearful, feeling overwhelmed, irritability, increased stress/anxiety. Her father was recently diagnosed with cancer and her marriage is failing.   She has noticed palpitations with shortness of breath. She's using her albuterol inhaler multiple times daily. She was never contacted for her therapy appointment.    Review of Systems  Respiratory:  Positive for shortness of breath.   Cardiovascular:  Positive for palpitations.  Psychiatric/Behavioral:  Positive for sleep disturbance. Negative for suicidal ideas. The patient is nervous/anxious.          Past Medical History:  Diagnosis Date   Anxiety    Arthritis    Asthma    Cervical radiculopathy 09/01/2018   Chest pain 06/01/2022   Chickenpox    COVID-19 virus infection 10/24/2022   Depression    Family history of breast cancer    4/21 cancer genetic testing letter sent   Hematuria 11/17/2022   History of kidney stones    History of MRSA infection 10/17/2014   Formatting of this note might be different from the original. Hospitalized at [facility] 08/2014. I+D, IV antibiotics   History of UTI    MRSA (methicillin resistant staph aureus) culture  positive 2004   spider bite   Muscle spasm 03/29/2022   Neck pain 03/29/2022   Pyelonephritis 11/17/2022   S/P endometrial ablation 09/15/2021   S/P tubal ligation 09/15/2021    Social History   Socioeconomic History   Marital status: Married    Spouse name: Not on file   Number of children: Not on file   Years of education: Not on file   Highest education level: Not on file  Occupational History   Not on file  Tobacco Use   Smoking status: Former    Types: Cigarettes    Quit date: 10/04/2012    Years since quitting: 10.2   Smokeless tobacco: Never  Vaping Use   Vaping Use: Never used  Substance and Sexual Activity   Alcohol use: Yes    Comment: occasionally   Drug use: Yes    Types: Marijuana   Sexual activity: Not Currently  Other Topics Concern   Not on file  Social History Narrative   Married   2 children born 2010 and 2011, both girls   Works as an Optometrist for an [employer]   Enjoys walking, running, spending time with her family   12/22/2016   Social Determinants of Radio broadcast assistant Strain: Not on file  Food Insecurity: Not on file  Transportation Needs: Not on file  Physical Activity: Not on file  Stress: Not on file  Social Connections: Not on file  Intimate Partner Violence: Not on file    Past Surgical History:  Procedure Laterality  Date   DILATATION AND CURETTAGE/HYSTEROSCOPY WITH MINERVA N/A 09/02/2021   Procedure: DILATATION AND CURETTAGE/HYSTEROSCOPY WITH MINERVA;  Surgeon: Gae Dry, MD;  Location: [facility] OR[facility];  Service: Gynecology;  Laterality: N/A;   FOOT SURGERY Left 11/14/2012   screw in foot   LAPAROSCOPIC BILATERAL SALPINGECTOMY Bilateral 09/02/2021   Procedure: LAPAROSCOPIC BILATERAL SALPINGECTOMY;  Surgeon: Gae Dry, MD;  Location: [facility] OR[facility];  Service: Gynecology;  Laterality: Bilateral;   TUBAL LIGATION     WOUND DEBRIDEMENT Right 12/2012   inner thigh with drain ( spider bite MRSA)    Family  History  Problem Relation Age of Onset   Arthritis Father    Hyperlipidemia Father    Hypertension Father    Mental illness Father    Diabetes Father    Hyperlipidemia Maternal Grandmother    Arthritis Maternal Grandmother    Breast cancer Maternal Grandmother    Hypertension Maternal Grandmother    Mental illness Maternal Grandmother    Diabetes Maternal Grandmother    Cancer Maternal Grandmother 20       Breast   Cancer - Other Maternal Grandmother        bile duct   Arthritis Maternal Grandfather    Mental illness Paternal Grandmother    Hypertension Paternal Grandmother    Arthritis Paternal Grandmother    Arthritis Paternal Grandfather    Mental illness Paternal Grandfather    Hypertension Paternal Grandfather     Allergies  Allergen Reactions   Vancomycin Swelling    Face and throat   Lamotrigine Hives   Septra [Sulfamethoxazole-Trimethoprim] Rash    Current Outpatient Medications on File Prior to Visit  Medication Sig Dispense Refill   albuterol (VENTOLIN HFA) 108 (90 Base) MCG/ACT inhaler Inhale 2 puffs into the lungs every 6 (six) hours as needed for shortness of breath. 1 each 0   busPIRone (BUSPAR) 5 MG tablet TAKE 1 TABLET (5 MG TOTAL) BY MOUTH 2 (TWO) TIMES DAILY. FOR ANXIETY. 180 tablet 2   cetirizine (ZYRTEC) 10 MG tablet Take 10 mg by mouth in the morning.     sertraline (ZOLOFT) 50 MG tablet Take 1 tablet (50 mg total) by mouth daily. for anxiety and depression. 90 tablet 0   No current facility-administered medications on file prior to visit.    BP 128/82   Pulse 98   Temp 98.2 F (36.8 C) (Temporal)   Ht 5' 1"$  (1.549 m)   Wt 220 lb (99.8 kg)   SpO2 100%   BMI 41.57 kg/m  Objective:   Physical Exam Cardiovascular:     Rate and Rhythm: Normal rate and regular rhythm.  Pulmonary:     Effort: Pulmonary effort is normal.     Breath sounds: Normal breath sounds.  Musculoskeletal:     Cervical back: Neck supple.  Skin:    General: Skin is  warm and dry.           Assessment & Plan:  Adjustment reaction with anxiety and depression Assessment & Plan: Uncontrolled and deteriorating.  PHQ9-17 GAD 7- 21  Increase Zoloft 50 mg to 75 mg for one week and then 100 mg thereafter.   Continue Buspar 5 mg BID.   She will reschedule her therapy appointment.   Consider changing to different class of medication if symptoms persist.   Follow up in four weeks.  I evaluated patient, was consulted regarding treatment, and agree with assessment and plan per Tinnie Gens, RN, DNP student.   Allie Bossier, NP-C  Pleas Koch, NP

## 2023-01-03 ENCOUNTER — Encounter (INDEPENDENT_AMBULATORY_CARE_PROVIDER_SITE_OTHER): Payer: BC Managed Care – PPO

## 2023-01-03 DIAGNOSIS — J4530 Mild persistent asthma, uncomplicated: Secondary | ICD-10-CM

## 2023-01-04 DIAGNOSIS — J4530 Mild persistent asthma, uncomplicated: Secondary | ICD-10-CM | POA: Diagnosis not present

## 2023-01-04 MED ORDER — PREDNISONE 20 MG PO TABS: ORAL_TABLET | ORAL | 0 refills | Status: DC

## 2023-01-04 MED ORDER — PULMICORT FLEXHALER 90 MCG/ACT IN AEPB: 1.0000 | INHALATION_SPRAY | Freq: Two times a day (BID) | RESPIRATORY_TRACT | 0 refills | Status: DC

## 2023-01-04 NOTE — Telephone Encounter (Signed)
Please see the MyChart message reply(ies) for my assessment and plan.  The patient gave consent for this Medical Advice Message and is aware that it may result in a bill to their insurance company as well as the possibility that this may result in a co-payment or deductible. They are an established patient, but are not seeking medical advice exclusively about a problem treated during an in person or video visit in the last 7 days. I did not recommend an in person or video visit within 7 days of my reply.  I spent a total of 10 minutes cumulative time within 7 days through [place], NP

## 2023-01-11 ENCOUNTER — Other Ambulatory Visit: Payer: Self-pay | Admitting: Primary Care

## 2023-01-11 DIAGNOSIS — J4520 Mild intermittent asthma, uncomplicated: Secondary | ICD-10-CM

## 2023-01-11 NOTE — Telephone Encounter (Signed)
Please call patient:  Did she pick up the new inhaler for asthma last week? Budesonide (Pulmicort)? Is it helping?  How often is she using her albuterol inhaler now? Does she actually need a refill?

## 2023-01-11 NOTE — Telephone Encounter (Signed)
Called and spoke to patient she states the Pulmicort inhaler has helped tremendously and she is no longer having issues breathing when using it. She has not had to use the albuterol inhaler since she got the Pulmicort. She does not need a refill at this time.

## 2023-01-31 ENCOUNTER — Encounter: Payer: Self-pay | Admitting: Primary Care

## 2023-01-31 ENCOUNTER — Ambulatory Visit: Payer: BC Managed Care – PPO | Admitting: Primary Care

## 2023-02-26 ENCOUNTER — Other Ambulatory Visit: Payer: Self-pay | Admitting: Primary Care

## 2023-02-26 DIAGNOSIS — F4323 Adjustment disorder with mixed anxiety and depressed mood: Secondary | ICD-10-CM

## 2023-02-26 NOTE — Telephone Encounter (Signed)
Please call patient:  She missed her appointment with me last month.  Is she taking 100 mg of Zoloft (2 of the 50 mg pills) daily? Is this helping?  If so, then I'll send a Rx for the 100 mg dose of Zoloft so she only has to take 1 once daily.  Let me know

## 2023-02-27 MED ORDER — DULOXETINE HCL 30 MG PO CPEP: 30.0000 mg | ORAL_CAPSULE | Freq: Every day | ORAL | 0 refills | Status: DC

## 2023-02-27 NOTE — Telephone Encounter (Signed)
Called and spoke with patient she is agreeable to trying duloxetine. Scheduled virtual visit for 1 mo.  She apologized for missing her previous appt she stated her father was sick and she had to unexpectedly go down to [place] to help him.

## 2023-02-27 NOTE — Telephone Encounter (Signed)
If Zoloft is ineffective, and the citalopram was before (reason we switched to Zoloft), then I recommend we try a different medication called duloxetine (Cymbalta).   Does she want to try? If so, then I want to see her back in 1 month for follow up. We can do a virtual or in person visit. We just need to catch back up.

## 2023-02-27 NOTE — Telephone Encounter (Signed)
No need for apologies! Since she's been taking 100 mg of Zoloft, I recommend she cut back to 50 mg daily for 1 week, then stop. At the same time she reduces to 50 mg of Zoloft she can start the duloxetine 30 mg. Just stop the Zoloft 50 mg after 1 week.

## 2023-02-27 NOTE — Telephone Encounter (Signed)
Called and spoke to patient she states she is taking 100mg  of Zoloft daily and she cannot tell a difference with taking it. She feels it hasn't done much for her. She stated it does make her sleepy. She would like to go back on the citalopram 40mg  tabs as this did not make her sleepy. Please advise.

## 2023-02-28 NOTE — Telephone Encounter (Signed)
Called patient and reviewed all information. Patient verbalized understanding. Will call if any further questions.  

## 2023-03-30 ENCOUNTER — Encounter: Payer: Self-pay | Admitting: Primary Care

## 2023-03-30 ENCOUNTER — Telehealth (INDEPENDENT_AMBULATORY_CARE_PROVIDER_SITE_OTHER): Payer: BC Managed Care – PPO | Admitting: Primary Care

## 2023-03-30 DIAGNOSIS — F4323 Adjustment disorder with mixed anxiety and depressed mood: Secondary | ICD-10-CM | POA: Diagnosis not present

## 2023-03-30 MED ORDER — DULOXETINE HCL 60 MG PO CPEP: 60.0000 mg | ORAL_CAPSULE | Freq: Every day | ORAL | 0 refills | Status: DC

## 2023-03-30 MED ORDER — BUSPIRONE HCL 10 MG PO TABS: 10.0000 mg | ORAL_TABLET | Freq: Two times a day (BID) | ORAL | 0 refills | Status: DC

## 2023-03-30 NOTE — Progress Notes (Signed)
Patient ID: Katherine Ruiz, female    DOB: [birth year], 32 y.o.   MRN: [id number]  Virtual visit completed through [employer], a video enabled telemedicine application. Due to national recommendations of social distancing due to COVID-19, a virtual visit is felt to be most appropriate for this patient at this time. Reviewed limitations, risks, security and privacy concerns of performing a virtual visit and the availability of in person appointments. I also reviewed that there may be a patient responsible charge related to this service. The patient agreed to proceed.   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    There were no vitals taken for this visit.   CC: Follow up for anxiety Subjective:   HPI: Katherine Ruiz is a 32 y.o. female with a history of anxiety and depression presenting on 03/30/2023 for Anxiety  She was last evaluated in February 2024 for follow up of anxiety and depression. During this visit she wasn't doing well, continued to feel symptoms of tearfulness, mind racing thoughts, frequent worrying, increased irritability despite management on Zoloft 50 mg daily and Buspar 5 mg BID. Given these symptoms we increased her Zoloft to 100 mg daily and continued her Buspar 5 mg BID. She was asked to follow up 4 weeks later.  She missed her follow up visit in April. She was contacted and notified us that she hadn't noticed improvement on Zoloft 100 mg and requested to resume citalopram. Given no improvement we switched her to Cymbalta 30 mg and continued her Buspar 5 mg BID. She is here for follow up today.  Today she mentions that she initially felt better after switching to Cymbalta (felt slightly less anxious and slightly less depressed), but the effects have worn off and she feels back to how  she felt previously. She has tried to contact therapy for an appointment but hasn't connected due to her busy schedule.  She continues to take buspirone 5 mg twice daily, is not sure if this is helping.  She denies any negative effects from Cymbalta.      03/30/2023   11:51 AM 12/28/2022    8:34 AM 11/30/2022    9:58 AM  PHQ9 SCORE ONLY  PHQ-9 Total Score 17 17 17       03/30/2023   11:53 AM 12/28/2022    8:35 AM 11/30/2022    9:58 AM 04/05/2019    2:10 PM  GAD 7 : Generalized Anxiety Score  Nervous, Anxious, on Edge 2 3 3 3   Control/stop worrying 2 3 2 2   Worry too much - different things 2 3 2 2   Trouble relaxing 3 3 2 2   Restless 3 3 3 2   Easily annoyed or irritable 3 3 3 2   Afraid - awful might happen 0 3 2 0  Total GAD 7 Score 15 21 17 13   Anxiety Difficulty Very difficult Extremely difficult Extremely difficult Somewhat difficult          Relevant past medical, surgical, family and social history reviewed and updated as indicated. Interim medical history since our last visit reviewed. Allergies and medications reviewed and updated. Outpatient Medications Prior to Visit  Medication Sig Dispense Refill   albuterol (VENTOLIN HFA) 108 (90 Base) MCG/ACT inhaler Inhale 2 puffs into the lungs every 6 (six) hours as needed for shortness of breath. 1 each  0   Budesonide (PULMICORT FLEXHALER) 90 MCG/ACT inhaler Inhale 1-2 puffs into the lungs 2 (two) times daily. For asthma 1 each 0   cetirizine (ZYRTEC) 10 MG tablet Take 10 mg by mouth in the morning.     busPIRone (BUSPAR) 5 MG tablet TAKE 1 TABLET (5 MG TOTAL) BY MOUTH 2 (TWO) TIMES DAILY. FOR ANXIETY. 180 tablet 2   DULoxetine (CYMBALTA) 30 MG capsule Take 1 capsule (30 mg total) by mouth daily. for anxiety and depression. 90 capsule 0   predniSONE (DELTASONE) 20 MG tablet Take 2 tablets by mouth once daily in the morning for 5 days. (Patient not taking: Reported on 03/30/2023) 10 tablet 0   No facility-administered medications  prior to visit.     Per HPI unless specifically indicated in ROS section below Review of Systems  Gastrointestinal:  Negative for abdominal distention and nausea.  Neurological:  Negative for headaches.  Psychiatric/Behavioral:  Negative for suicidal ideas. The patient is nervous/anxious.        See HPI   Objective:  There were no vitals taken for this visit.  Wt Readings from Last 3 Encounters:  12/28/22 220 lb (99.8 kg)  11/30/22 219 lb (99.3 kg)  11/21/22 216 lb 0.8 oz (98 kg)       Physical exam: General: Alert and oriented x 3, no distress, does not appear sickly  Pulmonary: Speaks in complete sentences without increased work of breathing, no cough during visit.  Psychiatric: Normal mood, thought content, and behavior.     Results for orders placed or performed in visit on 11/30/22  Urine Culture   Specimen: Blood  Result Value Ref Range   MICRO NUMBER: [id number]    SPECIMEN QUALITY: Adequate    Sample Source URINE    STATUS: FINAL    ISOLATE 1: Escherichia coli (A)       Susceptibility   Escherichia coli - URINE CULTURE, REFLEX    AMOX/CLAVULANIC <=2 Sensitive     AMPICILLIN 4 Sensitive     AMPICILLIN/SULBACTAM <=2 Sensitive     CEFAZOLIN* <=4 Not Reportable      * For infections other than uncomplicated UTI caused by E. coli, K. pneumoniae or P. mirabilis: Cefazolin is resistant if MIC > or = 8 mcg/mL. (Distinguishing susceptible versus intermediate for isolates with MIC < or = 4 mcg/mL requires additional testing.) For uncomplicated UTI caused by E. coli, K. pneumoniae or P. mirabilis: Cefazolin is susceptible if MIC <32 mcg/mL and predicts susceptible to the oral agents cefaclor, cefdinir, cefpodoxime, cefprozil, cefuroxime, cephalexin and loracarbef.     CEFTAZIDIME <=1 Sensitive     CEFEPIME <=1 Sensitive     CEFTRIAXONE <=1 Sensitive     CIPROFLOXACIN <=0.25 Sensitive     LEVOFLOXACIN <=0.12 Sensitive     GENTAMICIN <=1 Sensitive     IMIPENEM  <=0.25 Sensitive     NITROFURANTOIN <=16 Sensitive     PIP/TAZO <=4 Sensitive     TOBRAMYCIN <=1 Sensitive     TRIMETH/SULFA* <=20 Sensitive      * For infections other than uncomplicated UTI caused by E. coli, K. pneumoniae or P. mirabilis: Cefazolin is resistant if MIC > or = 8 mcg/mL. (Distinguishing susceptible versus intermediate for isolates with MIC < or = 4 mcg/mL requires additional testing.) For uncomplicated UTI caused by E. coli, K. pneumoniae or P. mirabilis: Cefazolin is susceptible if MIC <32 mcg/mL and predicts susceptible to the oral agents cefaclor, cefdinir, cefpodoxime, cefprozil, cefuroxime, cephalexin and loracarbef. Legend: S =  Susceptible  I = Intermediate R = Resistant  NS = Not susceptible * = Not tested  NR = Not reported **NN = See antimicrobic comments   Hepatic function panel  Result Value Ref Range   Total Bilirubin 0.3 0.2 - 1.2 mg/dL   Bilirubin, Direct 0.1 0.0 - 0.3 mg/dL   Alkaline Phosphatase 39 39 - 117 U/L   AST 16 0 - 37 U/L   ALT 26 0 - 35 U/L   Total Protein 7.3 6.0 - 8.3 g/dL   Albumin 4.6 3.5 - 5.2 g/dL  Urine Microscopic  Result Value Ref Range   WBC, UA none seen 0-2/hpf   RBC / HPF none seen 0-2/hpf   Squamous Epithelial / HPF Rare(0-4/hpf) Rare(0-4/hpf)  POCT Urinalysis Dipstick (Automated)  Result Value Ref Range   Color, UA yellow    Clarity, UA clear    Glucose, UA Negative Negative   Bilirubin, UA Negative    Ketones, UA Negative    Spec Grav, UA 1.020 1.010 - 1.025   Blood, UA trace    pH, UA 6.0 5.0 - 8.0   Protein, UA Negative Negative   Urobilinogen, UA negative (A) 0.2 or 1.0 E.U./dL   Nitrite, UA negative    Leukocytes, UA Negative Negative   Assessment & Plan:   Problem List Items Addressed This Visit       Other   Adjustment reaction with anxiety and depression - Primary    Initially improved, now deteriorated.  Increase Cymbalta to 60 mg daily.  New prescription sent to pharmacy. Increase  buspirone to 10 mg twice daily.  New prescription sent to pharmacy.  She will schedule an appointment with her therapist. Close follow-up with me in 1 month.      Relevant Medications   DULoxetine (CYMBALTA) 60 MG capsule   busPIRone (BUSPAR) 10 MG tablet     Meds ordered this encounter  Medications   DULoxetine (CYMBALTA) 60 MG capsule    Sig: Take 1 capsule (60 mg total) by mouth daily. for anxiety and depression.    Dispense:  90 capsule    Refill:  0    Order Specific Question:   Supervising Provider    Answer:   BEDSOLE, AMY E [[id number]]   busPIRone (BUSPAR) 10 MG tablet    Sig: Take 1 tablet (10 mg total) by mouth 2 (two) times daily. For anxiety    Dispense:  180 tablet    Refill:  0    Order Specific Question:   Supervising Provider    Answer:   BEDSOLE, AMY E [[id number]]   No orders of the defined types were placed in this encounter.   I discussed the assessment and treatment plan with the patient. The patient was provided an opportunity to ask questions and all were answered. The patient agreed with the plan and demonstrated an understanding of the instructions. The patient was advised to call back or seek an in-person evaluation if the symptoms worsen or if the condition fails to improve as anticipated.  Follow up plan:  We increased your dose of Cymbalta to 60 mg daily.  I sent a new prescription to your pharmacy.  We increased the dose of your buspirone to 10 mg twice daily.  I sent a new prescription to your pharmacy.  Please schedule a virtual follow-up visit for 1 month.  Please try to schedule an appoint with your therapist.  It was a pleasure to see you today!   Keane Scrape  Carlis Abbott, NP

## 2023-03-30 NOTE — Patient Instructions (Signed)
We increased your dose of Cymbalta to 60 mg daily.  I sent a new prescription to your pharmacy.  We increased the dose of your buspirone to 10 mg twice daily.  I sent a new prescription to your pharmacy.  Please schedule a virtual follow-up visit for 1 month.  Please try to schedule an appoint with your therapist.  It was a pleasure to see you today!

## 2023-03-30 NOTE — Assessment & Plan Note (Signed)
Initially improved, now deteriorated.  Increase Cymbalta to 60 mg daily.  New prescription sent to pharmacy. Increase buspirone to 10 mg twice daily.  New prescription sent to pharmacy.  She will schedule an appointment with her therapist. Close follow-up with me in 1 month.

## 2023-05-10 ENCOUNTER — Encounter: Payer: Self-pay | Admitting: Primary Care

## 2023-05-10 ENCOUNTER — Ambulatory Visit: Payer: BC Managed Care – PPO | Admitting: Primary Care

## 2023-05-10 VITALS — BP 114/66 | HR 71 | Temp 99.4°F | Ht 61.0 in | Wt 221.0 lb

## 2023-05-10 DIAGNOSIS — N3946 Mixed incontinence: Secondary | ICD-10-CM | POA: Insufficient documentation

## 2023-05-10 DIAGNOSIS — N898 Other specified noninflammatory disorders of vagina: Secondary | ICD-10-CM | POA: Diagnosis not present

## 2023-05-10 LAB — POC URINALSYSI DIPSTICK (AUTOMATED)
Bilirubin, UA: NEGATIVE
Blood, UA: NEGATIVE
Glucose, UA: NEGATIVE
Ketones, UA: NEGATIVE
Leukocytes, UA: NEGATIVE
Nitrite, UA: NEGATIVE
Protein, UA: NEGATIVE
Spec Grav, UA: <=1.005 — AB (ref 1.010–1.025)
Urobilinogen, UA: 0.2 E.U./dL
pH, UA: 6 (ref 5.0–8.0)

## 2023-05-10 NOTE — Assessment & Plan Note (Addendum)
Urinalysis today negative.  We discussed options for treatment including Kegel exercises, pelvic floor physical therapy, medication treatment, urology referral. She opts for urology referral.  Referral placed.

## 2023-05-10 NOTE — Progress Notes (Signed)
Subjective:    Patient ID: Katherine Ruiz, female    DOB: Sep 23, 1991, 32 y.o.   MRN: [id number]  HPI  Katherine Ruiz is a very pleasant 32 y.o. female with a history of urinary urgency, asthma, anxiety and depression who presents today to discuss a few concerns.  1) Urinary Incontinence: Chronic since the delivery of her first baby in 2010. Historically, her incontinence was intermittent and occurring only with a deep sneeze or cough. Over the last week she became very anxious which caused her to vomit, this caused urinary incontinence and she soaked her clothing.   Over the last two days her incontinence has become worse and she's been unable to hold her urine. She's also noticed urinary urgency and will lose control of her bladder if she doesn't make it in time to the bathroom. She is soaking through pads.  She denies hematuria, dysuria, flank pain, abdominal pain, recent antibiotic use. She had her fallopian tubes removed two years ago due to an ectopic pregnancy.   Her mother had to have her bladder tacked at an early age due to incontinence.  2) Vaginal Itching: Acute onset two days ago with vaginal itching. She denies vaginal discharge.   She's used Monistat two days who without improvement in itching.   She is sexually active on occasion with her husband.  No recent antibiotic use.   Review of Systems  Gastrointestinal:  Negative for abdominal pain and nausea.  Genitourinary:  Positive for urgency. Negative for dysuria, frequency, hematuria, pelvic pain and vaginal discharge.       Urinary incontinence          Past Medical History:  Diagnosis Date   Anxiety    Arthritis    Asthma    Cervical radiculopathy 09/01/2018   Chest pain 06/01/2022   Chickenpox    COVID-19 virus infection 10/24/2022   Depression    Family history of breast cancer    4/21 cancer genetic testing letter sent   Hematuria 11/17/2022   History of kidney stones    History of MRSA infection  10/17/2014   Formatting of this note might be different from the original. Hospitalized at [facility] 08/2014. I+D, IV antibiotics   History of UTI    MRSA (methicillin resistant staph aureus) culture positive 2004   spider bite   Muscle spasm 03/29/2022   Neck pain 03/29/2022   Pyelonephritis 11/17/2022   S/P endometrial ablation 09/15/2021   S/P tubal ligation 09/15/2021    Social History   Socioeconomic History   Marital status: Married    Spouse name: Not on file   Number of children: Not on file   Years of education: Not on file   Highest education level: Not on file  Occupational History   Not on file  Tobacco Use   Smoking status: Former    Types: Cigarettes    Quit date: 10/04/2012    Years since quitting: 10.6   Smokeless tobacco: Never  Vaping Use   Vaping Use: Never used  Substance and Sexual Activity   Alcohol use: Yes    Comment: occasionally   Drug use: Yes    Types: Marijuana   Sexual activity: Not Currently  Other Topics Concern   Not on file  Social History Narrative   Married   2 children born 2010 and 2011, both girls   Works as an Airline pilot for an [employer]   Enjoys walking, running, spending time with her family  12/22/2016   Social Determinants of Health   Financial Resource Strain: Not on file  Food Insecurity: Not on file  Transportation Needs: Not on file  Physical Activity: Not on file  Stress: Not on file  Social Connections: Not on file  Intimate Partner Violence: Not on file    Past Surgical History:  Procedure Laterality Date   DILATATION AND CURETTAGE/HYSTEROSCOPY WITH MINERVA N/A 09/02/2021   Procedure: DILATATION AND CURETTAGE/HYSTEROSCOPY WITH MINERVA;  Surgeon: Nadara Mustard, MD;  Location: [facility];  Service: Gynecology;  Laterality: N/A;   FOOT SURGERY Left 11/14/2012   screw in foot   LAPAROSCOPIC BILATERAL SALPINGECTOMY Bilateral 09/02/2021   Procedure: LAPAROSCOPIC BILATERAL SALPINGECTOMY;   Surgeon: Nadara Mustard, MD;  Location: [facility];  Service: Gynecology;  Laterality: Bilateral;   TUBAL LIGATION     WOUND DEBRIDEMENT Right 12/2012   inner thigh with drain ( spider bite MRSA)    Family History  Problem Relation Age of Onset   Arthritis Father    Hyperlipidemia Father    Hypertension Father    Mental illness Father    Diabetes Father    Hyperlipidemia Maternal Grandmother    Arthritis Maternal Grandmother    Breast cancer Maternal Grandmother    Hypertension Maternal Grandmother    Mental illness Maternal Grandmother    Diabetes Maternal Grandmother    Cancer Maternal Grandmother 17       Breast   Cancer - Other Maternal Grandmother        bile duct   Arthritis Maternal Grandfather    Mental illness Paternal Grandmother    Hypertension Paternal Grandmother    Arthritis Paternal Grandmother    Arthritis Paternal Grandfather    Mental illness Paternal Grandfather    Hypertension Paternal Grandfather     Allergies  Allergen Reactions   Vancomycin Swelling    Face and throat   Lamotrigine Hives   Septra [Sulfamethoxazole-Trimethoprim] Rash    Current Outpatient Medications on File Prior to Visit  Medication Sig Dispense Refill   albuterol (VENTOLIN HFA) 108 (90 Base) MCG/ACT inhaler Inhale 2 puffs into the lungs every 6 (six) hours as needed for shortness of breath. 1 each 0   Budesonide (PULMICORT FLEXHALER) 90 MCG/ACT inhaler Inhale 1-2 puffs into the lungs 2 (two) times daily. For asthma 1 each 0   busPIRone (BUSPAR) 10 MG tablet Take 1 tablet (10 mg total) by mouth 2 (two) times daily. For anxiety 180 tablet 0   cetirizine (ZYRTEC) 10 MG tablet Take 10 mg by mouth in the morning.     DULoxetine (CYMBALTA) 60 MG capsule Take 1 capsule (60 mg total) by mouth daily. for anxiety and depression. 90 capsule 0   No current facility-administered medications on file prior to visit.    BP 114/66   Pulse 71   Temp 99.4 F (37.4 C) (Temporal)   Ht 5\' 1"   (1.549 m)   Wt 221 lb (100.2 kg)   SpO2 100%   BMI 41.76 kg/m  Objective:   Physical Exam Exam conducted with a chaperone present.  Constitutional:      Appearance: She is not ill-appearing.  Cardiovascular:     Rate and Rhythm: Normal rate.  Pulmonary:     Effort: Pulmonary effort is normal.  Genitourinary:    Labia:        Right: No tenderness or lesion.        Left: No tenderness or lesion.      Vagina: Vaginal discharge present.  Cervix: No cervical motion tenderness.     Comments: Scant amount of whitish/clear discharge Skin:    General: Skin is warm and dry.           Assessment & Plan:  Mixed stress and urge urinary incontinence Assessment & Plan: Urinalysis today negative.  We discussed options for treatment including Kegel exercises, pelvic floor physical therapy, medication treatment, urology referral. She opts for urology referral.  Referral placed.    Orders: -     POCT Urinalysis Dipstick (Automated) -     Ambulatory referral to Urology  Vaginal itching Assessment & Plan: Wet prep collected and pending. Await results.  Orders: -     WET PREP BY MOLECULAR PROBE        Doreene Nest, NP

## 2023-05-10 NOTE — Assessment & Plan Note (Signed)
Wet prep collected and pending. Await results.

## 2023-05-10 NOTE — Patient Instructions (Addendum)
We will be in touch tomorrow regarding your vaginal swab results.  You will either be contacted via phone regarding your referral to Urology, or you may receive a letter on your MyChart portal from our referral team with instructions for scheduling an appointment. Please let us know if you have not been contacted by anyone within two weeks.  It was a pleasure to see you today!

## 2023-05-11 ENCOUNTER — Other Ambulatory Visit: Payer: Self-pay | Admitting: Primary Care

## 2023-05-11 DIAGNOSIS — N760 Acute vaginitis: Secondary | ICD-10-CM

## 2023-05-11 LAB — WET PREP BY MOLECULAR PROBE
Candida species: NOT DETECTED
MICRO NUMBER:: 15130419
SPECIMEN QUALITY:: ADEQUATE
Trichomonas vaginosis: NOT DETECTED

## 2023-05-11 MED ORDER — METRONIDAZOLE 500 MG PO TABS: 500.0000 mg | ORAL_TABLET | Freq: Two times a day (BID) | ORAL | 0 refills | Status: AC

## 2023-05-15 ENCOUNTER — Ambulatory Visit: Payer: Self-pay | Admitting: Urology

## 2023-06-19 ENCOUNTER — Ambulatory Visit (INDEPENDENT_AMBULATORY_CARE_PROVIDER_SITE_OTHER): Payer: BC Managed Care – PPO | Admitting: Urology

## 2023-06-19 ENCOUNTER — Encounter: Payer: Self-pay | Admitting: Urology

## 2023-06-19 ENCOUNTER — Ambulatory Visit: Payer: BC Managed Care – PPO | Admitting: Urology

## 2023-06-19 VITALS — BP 134/83 | HR 82 | Ht 61.2 in | Wt 218.8 lb

## 2023-06-19 DIAGNOSIS — N3946 Mixed incontinence: Secondary | ICD-10-CM

## 2023-06-19 LAB — MICROSCOPIC EXAMINATION: Epithelial Cells (non renal): >10 /hpf — AB (ref 0–10)

## 2023-06-19 LAB — URINALYSIS, COMPLETE
Bilirubin, UA: NEGATIVE
Glucose, UA: NEGATIVE
Ketones, UA: NEGATIVE
Leukocytes,UA: NEGATIVE
Nitrite, UA: NEGATIVE
Protein,UA: NEGATIVE
Specific Gravity, UA: 1.015 (ref 1.005–1.030)
Urobilinogen, Ur: 0.2 mg/dL (ref 0.2–1.0)
pH, UA: 6.5 (ref 5.0–7.5)

## 2023-06-19 NOTE — Progress Notes (Signed)
06/19/2023 10:33 AM   Katherine Ruiz [birth year] [id number]  Referring provider: Doreene Nest, NP [address] [place],  [place] [address]  Chief Complaint  Patient presents with   Urinary Incontinence    NP    HPI: I was consulted to assess the patient's urinary incontinence.  She has urge incontinence with almost no warning.  She can leak with coughing sneezing bending lifting.  She is small-volume bedwetting.  She can soak 6 pads a day.  She said both are significant.  She voids every 2-3 hours gets up 2-3 times a night.  Flow is reasonable  She is get bladder infections.  No hysterectomy.  She has had a kidney stone.  No bladder surgery.  No neurologic issues.  Bowel function normal.  No treatment   PMH: Past Medical History:  Diagnosis Date   Anxiety    Arthritis    Asthma    Cervical radiculopathy 09/01/2018   Chest pain 06/01/2022   Chickenpox    COVID-19 virus infection 10/24/2022   Depression    Family history of breast cancer    4/21 cancer genetic testing letter sent   Hematuria 11/17/2022   History of kidney stones    History of MRSA infection 10/17/2014   Formatting of this note might be different from the original. Hospitalized at [facility] 08/2014. I+D, IV antibiotics   History of UTI    MRSA (methicillin resistant staph aureus) culture positive 2004   spider bite   Muscle spasm 03/29/2022   Neck pain 03/29/2022   Pyelonephritis 11/17/2022   S/P endometrial ablation 09/15/2021   S/P tubal ligation 09/15/2021    Surgical History: Past Surgical History:  Procedure Laterality Date   DILATATION AND CURETTAGE/HYSTEROSCOPY WITH MINERVA N/A 09/02/2021   Procedure: DILATATION AND CURETTAGE/HYSTEROSCOPY WITH MINERVA;  Surgeon: Nadara Mustard, MD;  Location: [facility] ORS;  Service: Gynecology;  Laterality: N/A;   FOOT SURGERY Left 11/14/2012   screw in foot   LAPAROSCOPIC BILATERAL SALPINGECTOMY Bilateral 09/02/2021   Procedure:  LAPAROSCOPIC BILATERAL SALPINGECTOMY;  Surgeon: Nadara Mustard, MD;  Location: [facility] ORS;  Service: Gynecology;  Laterality: Bilateral;   TUBAL LIGATION     WOUND DEBRIDEMENT Right 12/2012   inner thigh with drain ( spider bite MRSA)    Home Medications:  Allergies as of 06/19/2023       Reactions   Vancomycin Swelling   Face and throat   Lamotrigine Hives   Septra [sulfamethoxazole-trimethoprim] Rash        Medication List        Accurate as of June 19, 2023 10:33 AM. If you have any questions, ask your nurse or doctor.          albuterol 108 (90 Base) MCG/ACT inhaler Commonly known as: VENTOLIN HFA Inhale 2 puffs into the lungs every 6 (six) hours as needed for shortness of breath.   busPIRone 10 MG tablet Commonly known as: BUSPAR Take 1 tablet (10 mg total) by mouth 2 (two) times daily. For anxiety   cetirizine 10 MG tablet Commonly known as: ZYRTEC Take 10 mg by mouth in the morning.   DULoxetine 60 MG capsule Commonly known as: Cymbalta Take 1 capsule (60 mg total) by mouth daily. for anxiety and depression.   Pulmicort Flexhaler 90 MCG/ACT inhaler Generic drug: Budesonide Inhale 1-2 puffs into the lungs 2 (two) times daily. For asthma        Allergies:  Allergies  Allergen Reactions   Vancomycin  Swelling    Face and throat   Lamotrigine Hives   Septra [Sulfamethoxazole-Trimethoprim] Rash    Family History: Family History  Problem Relation Age of Onset   Arthritis Father    Hyperlipidemia Father    Hypertension Father    Mental illness Father    Diabetes Father    Hyperlipidemia Maternal Grandmother    Arthritis Maternal Grandmother    Breast cancer Maternal Grandmother    Hypertension Maternal Grandmother    Mental illness Maternal Grandmother    Diabetes Maternal Grandmother    Cancer Maternal Grandmother 57       Breast   Cancer - Other Maternal Grandmother        bile duct   Arthritis Maternal Grandfather    Mental illness  Paternal Grandmother    Hypertension Paternal Grandmother    Arthritis Paternal Grandmother    Arthritis Paternal Grandfather    Mental illness Paternal Grandfather    Hypertension Paternal Grandfather     Social History:  reports that she quit smoking about 10 years ago. Her smoking use included cigarettes. She has never used smokeless tobacco. She reports current alcohol use. She reports current drug use. Drug: Marijuana.  ROS:                                        Physical Exam: BP 134/83   Pulse 82   Ht 5' 1.2" (1.554 m)   Wt 99.2 kg   BMI 41.07 kg/m   Constitutional:  Alert and oriented, No acute distress. HEENT: [place] AT, moist mucus membranes.  Trachea midline, no masses. Cardiovascular: No clubbing, cyanosis, or edema. Respiratory: Normal respiratory effort, no increased work of breathing. GI: Abdomen is soft, nontender, nondistended, no abdominal masses GU: Grade 2 hypermobility the bladder neck.  No stress incontinence with moderate cough.  No prolapse Skin: No rashes, bruises or suspicious lesions. Lymph: No cervical or inguinal adenopathy. Neurologic: Grossly intact, no focal deficits, moving all 4 extremities. Psychiatric: Normal mood and affect.  Laboratory Data: Lab Results  Component Value Date   WBC 8.1 11/21/2022   HGB 13.0 11/21/2022   HCT 38.5 11/21/2022   MCV 95.8 11/21/2022   PLT 304 11/21/2022    Lab Results  Component Value Date   CREATININE 0.78 11/21/2022    No results found for: "PSA"  No results found for: "TESTOSTERONE"  Lab Results  Component Value Date   HGBA1C 5.3 10/06/2020    Urinalysis    Component Value Date/Time   COLORURINE AMBER (A) 07/11/2016 1800   APPEARANCEUR HAZY (A) 07/11/2016 1800   APPEARANCEUR Hazy 03/13/2014 1903   LABSPEC 1.013 07/11/2016 1800   LABSPEC 1.016 03/13/2014 1903   PHURINE 5.0 07/11/2016 1800   GLUCOSEU NEGATIVE 07/11/2016 1800   GLUCOSEU Negative 03/13/2014 1903    HGBUR 2+ (A) 07/11/2016 1800   BILIRUBINUR neg 05/10/2023 0821   BILIRUBINUR Negative 03/13/2014 1903   KETONESUR NEGATIVE 07/11/2016 1800   PROTEINUR Negative 05/10/2023 0821   PROTEINUR 30 (A) 07/11/2016 1800   UROBILINOGEN 0.2 05/10/2023 0821   NITRITE neg 05/10/2023 0821   NITRITE POSITIVE (A) 07/11/2016 1800   LEUKOCYTESUR Negative 05/10/2023 0821   LEUKOCYTESUR Trace 03/13/2014 1903    Pertinent Imaging: Urine reviewed and sent for culture.  Chart reviewed  Assessment & Plan: Patient has mixed incontinence.  For years she is stress incontinence and her incontinence is worsened over  number years.  Am suspect that the overactive bladder component is difficult and she also small-volume bedwetting.  She will return for urodynamics and cystoscopy and we will proceed accordingly  Patient looks like she might have a bladder infection with microscopic hematuria as well.  Call if culture positive.   1. Mixed stress and urge urinary incontinence  - Urinalysis, Complete   No follow-ups on file.  Martina Sinner, MD  [facility] [address] [place], [place] [address] [phone]

## 2023-06-26 ENCOUNTER — Telehealth: Payer: Self-pay

## 2023-06-26 MED ORDER — CIPROFLOXACIN HCL 250 MG PO TABS: 250.0000 mg | ORAL_TABLET | Freq: Two times a day (BID) | ORAL | 0 refills | Status: DC

## 2023-06-26 NOTE — Telephone Encounter (Signed)
Pt aware per VM.   Med Erxed.

## 2023-06-26 NOTE — Telephone Encounter (Signed)
-----   Message from [place] A Macdiarmid sent at 06/24/2023  9:41 AM EDT ----- Ciprofloxacin 250 mg twice a day for 7 days ----- Message ----- From: Sueanne Margarita, CMA Sent: 06/23/2023   1:52 PM EDT To: Alfredo Martinez, MD   ----- Message ----- From: Interface, [facility] Lab Results In Sent: 06/19/2023   1:36 PM EDT To: Jennette Kettle Clinical

## 2023-07-03 ENCOUNTER — Ambulatory Visit: Payer: Self-pay | Admitting: Urology

## 2023-08-01 ENCOUNTER — Encounter: Payer: Self-pay | Admitting: Internal Medicine

## 2023-08-01 ENCOUNTER — Ambulatory Visit: Payer: BC Managed Care – PPO | Admitting: Internal Medicine

## 2023-08-01 ENCOUNTER — Telehealth: Payer: Self-pay | Admitting: Primary Care

## 2023-08-01 VITALS — BP 120/86 | HR 70 | Temp 98.0°F | Ht 61.5 in | Wt 217.0 lb

## 2023-08-01 DIAGNOSIS — J45901 Unspecified asthma with (acute) exacerbation: Secondary | ICD-10-CM | POA: Insufficient documentation

## 2023-08-01 DIAGNOSIS — J4521 Mild intermittent asthma with (acute) exacerbation: Secondary | ICD-10-CM | POA: Diagnosis not present

## 2023-08-01 HISTORY — DX: Unspecified asthma with (acute) exacerbation: J45.901

## 2023-08-01 MED ORDER — DOXYCYCLINE HYCLATE 100 MG PO TABS: 100.0000 mg | ORAL_TABLET | Freq: Two times a day (BID) | ORAL | 0 refills | Status: DC

## 2023-08-01 MED ORDER — PREDNISONE 20 MG PO TABS: 40.0000 mg | ORAL_TABLET | Freq: Every day | ORAL | 0 refills | Status: DC

## 2023-08-01 NOTE — Telephone Encounter (Addendum)
Pt has appt to see Dr Alphonsus Sias 08/01/23 at 10:45. I spoke with pt and she is on her way to [facility] now. Pt said she is in no distress in breathing.Sending note to Dr Alphonsus Sias.

## 2023-08-01 NOTE — Progress Notes (Signed)
Subjective:    Patient ID: Katherine Ruiz, female    DOB: 1991-03-02, 32 y.o.   MRN: [id number]  HPI Here due to cough and SOB  Started with some sinus symptoms several days ago All in her head Yesterday didn't feel good---and started with cough (and pain in chest with cough) Some wheezing--then got bad last night Did try the albuterol and the pulmicort but wheezing persisted Some better this morning--but chest still hurts  Cough is dry Still with head congestion--not too much drainage No fever since 3days ago--the first day of symptoms COVID test negative   No shakes or chills Some back pain--not really muscle aches  Tried nyquil--helped her sleep  Current Outpatient Medications on File Prior to Visit  Medication Sig Dispense Refill   albuterol (VENTOLIN HFA) 108 (90 Base) MCG/ACT inhaler Inhale 2 puffs into the lungs every 6 (six) hours as needed for shortness of breath. 1 each 0   Budesonide (PULMICORT FLEXHALER) 90 MCG/ACT inhaler Inhale 1-2 puffs into the lungs 2 (two) times daily. For asthma 1 each 0   busPIRone (BUSPAR) 10 MG tablet Take 1 tablet (10 mg total) by mouth 2 (two) times daily. For anxiety 180 tablet 0   cetirizine (ZYRTEC) 10 MG tablet Take 10 mg by mouth in the morning.     DULoxetine (CYMBALTA) 60 MG capsule Take 1 capsule (60 mg total) by mouth daily. for anxiety and depression. 90 capsule 0   No current facility-administered medications on file prior to visit.    Allergies  Allergen Reactions   Vancomycin Swelling    Face and throat   Lamotrigine Hives   Septra [Sulfamethoxazole-Trimethoprim] Rash    Past Medical History:  Diagnosis Date   Anxiety    Arthritis    Asthma    Cervical radiculopathy 09/01/2018   Chest pain 06/01/2022   Chickenpox    COVID-19 virus infection 10/24/2022   Depression    Family history of breast cancer    4/21 cancer genetic testing letter sent   Hematuria 11/17/2022   History of kidney stones    History of  MRSA infection 10/17/2014   Formatting of this note might be different from the original. Hospitalized at [facility] 08/2014. I+D, IV antibiotics   History of UTI    MRSA (methicillin resistant staph aureus) culture positive 2004   spider bite   Muscle spasm 03/29/2022   Neck pain 03/29/2022   Pyelonephritis 11/17/2022   S/P endometrial ablation 09/15/2021   S/P tubal ligation 09/15/2021    Past Surgical History:  Procedure Laterality Date   DILATATION AND CURETTAGE/HYSTEROSCOPY WITH MINERVA N/A 09/02/2021   Procedure: DILATATION AND CURETTAGE/HYSTEROSCOPY WITH MINERVA;  Surgeon: Nadara Mustard, MD;  Location: [facility] ORS;  Service: Gynecology;  Laterality: N/A;   FOOT SURGERY Left 11/14/2012   screw in foot   LAPAROSCOPIC BILATERAL SALPINGECTOMY Bilateral 09/02/2021   Procedure: LAPAROSCOPIC BILATERAL SALPINGECTOMY;  Surgeon: Nadara Mustard, MD;  Location: [facility] ORS;  Service: Gynecology;  Laterality: Bilateral;   TUBAL LIGATION     WOUND DEBRIDEMENT Right 12/2012   inner thigh with drain ( spider bite MRSA)    Family History  Problem Relation Age of Onset   Arthritis Father    Hyperlipidemia Father    Hypertension Father    Mental illness Father    Diabetes Father    Hyperlipidemia Maternal Grandmother    Arthritis Maternal Grandmother    Breast cancer Maternal Grandmother    Hypertension Maternal Grandmother  Mental illness Maternal Grandmother    Diabetes Maternal Grandmother    Cancer Maternal Grandmother 47       Breast   Cancer - Other Maternal Grandmother        bile duct   Arthritis Maternal Grandfather    Mental illness Paternal Grandmother    Hypertension Paternal Grandmother    Arthritis Paternal Grandmother    Arthritis Paternal Grandfather    Mental illness Paternal Grandfather    Hypertension Paternal Grandfather     Social History   Socioeconomic History   Marital status: Married    Spouse name: Not on file   Number of children: Not on  file   Years of education: Not on file   Highest education level: Not on file  Occupational History   Not on file  Tobacco Use   Smoking status: Former    Current packs/day: 0.00    Types: Cigarettes    Quit date: 10/04/2012    Years since quitting: 10.8   Smokeless tobacco: Never  Vaping Use   Vaping status: Never Used  Substance and Sexual Activity   Alcohol use: Yes    Comment: occasionally   Drug use: Yes    Types: Marijuana   Sexual activity: Yes  Other Topics Concern   Not on file  Social History Narrative   Married   2 children born 2010 and 2011, both girls   Works as an Airline pilot for an [employer]   Enjoys walking, running, spending time with her family   12/22/2016   Social Determinants of Corporate investment banker Strain: Not on file  Food Insecurity: Not on file  Transportation Needs: Not on file  Physical Activity: Not on file  Stress: Not on file  Social Connections: Unknown (03/17/2022)   Received from [facility]   Social Network    Social Network: Not on file  Intimate Partner Violence: Unknown (02/16/2022)   Received from [facility], [facility]   HITS    Physically Hurt: Not on file    Insult or Talk Down To: Not on file    Threaten Physical Harm: Not on file    Scream or Curse: Not on file   Review of Systems No N/V Able to eat Missed work yesterday     Objective:   Physical Exam Constitutional:      Appearance: Normal appearance.  HENT:     Head:     Comments: No sinus tenderness    Right Ear: Tympanic membrane and ear canal normal.     Left Ear: Tympanic membrane and ear canal normal.     Mouth/Throat:     Pharynx: No oropharyngeal exudate or posterior oropharyngeal erythema.  Pulmonary:     Effort: Pulmonary effort is normal.     Breath sounds: No rales.     Comments: Fair air movement Slight expiratory wheeze and sounds slightly tight--but no prolonged exp phase Musculoskeletal:     Cervical  back: Neck supple.  Lymphadenopathy:     Cervical: No cervical adenopathy.  Neurological:     Mental Status: She is alert.            Assessment & Plan:

## 2023-08-01 NOTE — Telephone Encounter (Signed)
FYI: This call has been transferred to Access Nurse. Once the result note has been entered staff can address the message at that time.  Patient called in with the following symptoms:  Red Word: sob, asthma attack   Please advise at Mobile [phone] (mobile)  Message is routed to Provider Pool and [facility] Triage    Pt called in stating she had a asthma attack on last night, 9/16 & currently still has sob. No other symptoms. Scheduled pt with Dr. Alphonsus Sias for today, 9/17 @ 10:45am. Transferred to access nurse.

## 2023-08-01 NOTE — Telephone Encounter (Signed)
I will assess her at today's visit

## 2023-08-01 NOTE — Assessment & Plan Note (Signed)
Mild and seemed to be brought on by sinus infection Discussed increasing the budesonide inhaler to max dosage with respiratory infections Continue albuterol --use spacer Prednisone 40 x 3, 20 x 3 Will give doxycyline 100 bid x 7 days for the sinusitis

## 2023-08-05 ENCOUNTER — Other Ambulatory Visit: Payer: Self-pay | Admitting: Primary Care

## 2023-08-05 DIAGNOSIS — F4323 Adjustment disorder with mixed anxiety and depressed mood: Secondary | ICD-10-CM

## 2023-08-14 ENCOUNTER — Encounter: Payer: Self-pay | Admitting: Urology

## 2023-08-14 ENCOUNTER — Ambulatory Visit (INDEPENDENT_AMBULATORY_CARE_PROVIDER_SITE_OTHER): Payer: BC Managed Care – PPO | Admitting: Urology

## 2023-08-14 VITALS — BP 118/80 | HR 79

## 2023-08-14 DIAGNOSIS — N3946 Mixed incontinence: Secondary | ICD-10-CM | POA: Diagnosis not present

## 2023-08-14 MED ORDER — GEMTESA 75 MG PO TABS: 1.0000 | ORAL_TABLET | Freq: Every day | ORAL | Status: DC

## 2023-08-14 NOTE — Progress Notes (Signed)
08/14/2023 2:54 PM   Katherine Ruiz Katherine Ruiz 03-04-1991 [id number]  Referring provider: Doreene Nest, NP [address] [place],  [place] [address]  No chief complaint on file.   HPI: I was consulted to assess the patient's urinary incontinence.  She has urge incontinence with almost no warning.  She can leak with coughing sneezing bending lifting.  She is small-volume bedwetting.  She can soak 6 pads a day.  She said both are significant.   She voids every 2-3 hours gets up 2-3 times a night.  Flow is reasonable   She is get bladder infections.  No hysterectomy.    Grade 2 hypermobility the bladder neck. No stress incontinence with moderate cough. No prolapse   Patient has mixed incontinence.  For years she is stress incontinence and her incontinence is worsened over number years.  I'm suspect that the overactive bladder component is difficult and she also small-volume bedwetting.  She will return for urodynamics and cystoscopy and we will proceed accordingly   Patient looks like she might have a bladder infection with microscopic hematuria as well.  Call if culture positive.  Today Frequency stay.  Last culture was positive with 2 organisms.  Incontinence stable.  Clinically not infected On pelvic examination she grade 2 hypermobility bladder neck and negative cough test after cystoscopy Cystoscopy: Patient underwent flexible cystoscopy.  Bladder mucosa and trigone were normal.  No cystitis.  No carcinoma well-tolerated. On urodynamics patient did not void and was catheterized for 200 mL.  Maximum bladder capacity was 580 mL.  Bladder was stable.  Her Valsalva leak point pressure at 400 mL was 61 cm of water with moderately severe leakage.  Valsalva leak point pressure of 500 mL was 50 cm of water with moderate leakage.  She did not generate a detrusor contraction during voluntary voiding in the lab setting.  In a private restroom she voided 400 mL and a residual was 180 mL.  Bladder  neck descent approximately 1 cm.  Voiding phase was prolonged and she was straining.  I do not think she was actually generating detrusor contractions and the increase in pressure on the detrusor pressure was subtraction artifact the same was seen during the filling phase when we were checking leak point pressures.  Having said that there may have been a few low pressure detrusor contractions early on in the voiding phase and halfway through.  She had a small cystocele noted the details of the urodynamics are signed and dictated.   PMH: Past Medical History:  Diagnosis Date   Anxiety    Arthritis    Asthma    Cervical radiculopathy 09/01/2018   Chest pain 06/01/2022   Chickenpox    COVID-19 virus infection 10/24/2022   Depression    Family history of breast cancer    4/21 cancer genetic testing letter sent   Hematuria 11/17/2022   History of kidney stones    History of MRSA infection 10/17/2014   Formatting of this note might be different from the original. Hospitalized at [facility] 08/2014. I+D, IV antibiotics   History of UTI    MRSA (methicillin resistant staph aureus) culture positive 2004   spider bite   Muscle spasm 03/29/2022   Neck pain 03/29/2022   Pyelonephritis 11/17/2022   S/P endometrial ablation 09/15/2021   S/P tubal ligation 09/15/2021    Surgical History: Past Surgical History:  Procedure Laterality Date   DILATATION AND CURETTAGE/HYSTEROSCOPY WITH MINERVA N/A 09/02/2021   Procedure: DILATATION  AND CURETTAGE/HYSTEROSCOPY WITH MINERVA;  Surgeon: Nadara Mustard, MD;  Location: [facility] ORS;  Service: Gynecology;  Laterality: N/A;   FOOT SURGERY Left 11/14/2012   screw in foot   LAPAROSCOPIC BILATERAL SALPINGECTOMY Bilateral 09/02/2021   Procedure: LAPAROSCOPIC BILATERAL SALPINGECTOMY;  Surgeon: Nadara Mustard, MD;  Location: [facility] ORS;  Service: Gynecology;  Laterality: Bilateral;   TUBAL LIGATION     WOUND DEBRIDEMENT Right 12/2012   inner thigh with  drain ( spider bite MRSA)    Home Medications:  Allergies as of 08/14/2023       Reactions   Vancomycin Swelling   Face and throat   Lamotrigine Hives   Septra [sulfamethoxazole-trimethoprim] Rash        Medication List        Accurate as of August 14, 2023  2:54 PM. If you have any questions, ask your nurse or doctor.          albuterol 108 (90 Base) MCG/ACT inhaler Commonly known as: VENTOLIN HFA Inhale 2 puffs into the lungs every 6 (six) hours as needed for shortness of breath.   busPIRone 10 MG tablet Commonly known as: BUSPAR TAKE 1 TABLET BY MOUTH 2 TIMES DAILY FOR ANXIETY   cetirizine 10 MG tablet Commonly known as: ZYRTEC Take 10 mg by mouth in the morning.   doxycycline 100 MG tablet Commonly known as: VIBRA-TABS Take 1 tablet (100 mg total) by mouth 2 (two) times daily.   DULoxetine 60 MG capsule Commonly known as: CYMBALTA TAKE 1 CAPSULE BY MOUTH DAILY FOR ANXIETY AND DEPRESSION.   predniSONE 20 MG tablet Commonly known as: DELTASONE Take 2 tablets (40 mg total) by mouth daily. For 3 days, then 1 tab daily for 3 days   Pulmicort Flexhaler 90 MCG/ACT inhaler Generic drug: Budesonide Inhale 1-2 puffs into the lungs 2 (two) times daily. For asthma        Allergies:  Allergies  Allergen Reactions   Vancomycin Swelling    Face and throat   Lamotrigine Hives   Septra [Sulfamethoxazole-Trimethoprim] Rash    Family History: Family History  Problem Relation Age of Onset   Arthritis Father    Hyperlipidemia Father    Hypertension Father    Mental illness Father    Diabetes Father    Hyperlipidemia Maternal Grandmother    Arthritis Maternal Grandmother    Breast cancer Maternal Grandmother    Hypertension Maternal Grandmother    Mental illness Maternal Grandmother    Diabetes Maternal Grandmother    Cancer Maternal Grandmother 41       Breast   Cancer - Other Maternal Grandmother        bile duct   Arthritis Maternal Grandfather     Mental illness Paternal Grandmother    Hypertension Paternal Grandmother    Arthritis Paternal Grandmother    Arthritis Paternal Grandfather    Mental illness Paternal Grandfather    Hypertension Paternal Grandfather     Social History:  reports that she quit smoking about 10 years ago. Her smoking use included cigarettes. She has never used smokeless tobacco. She reports current alcohol use. She reports current drug use. Drug: Marijuana.  ROS:                                        Physical Exam: There were no vitals taken for this visit.  Constitutional:  Alert and oriented, No acute  distress. HEENT: [place] AT, moist mucus membranes.  Trachea midline, no masses.   Laboratory Data: Lab Results  Component Value Date   WBC 8.1 11/21/2022   HGB 13.0 11/21/2022   HCT 38.5 11/21/2022   MCV 95.8 11/21/2022   PLT 304 11/21/2022    Lab Results  Component Value Date   CREATININE 0.78 11/21/2022    No results found for: "PSA"  No results found for: "TESTOSTERONE"  Lab Results  Component Value Date   HGBA1C 5.3 10/06/2020    Urinalysis    Component Value Date/Time   COLORURINE AMBER (A) 07/11/2016 1800   APPEARANCEUR Clear 06/19/2023 1020   LABSPEC 1.013 07/11/2016 1800   LABSPEC 1.016 03/13/2014 1903   PHURINE 5.0 07/11/2016 1800   GLUCOSEU Negative 06/19/2023 1020   GLUCOSEU Negative 03/13/2014 1903   HGBUR 2+ (A) 07/11/2016 1800   BILIRUBINUR Negative 06/19/2023 1020   BILIRUBINUR Negative 03/13/2014 1903   KETONESUR NEGATIVE 07/11/2016 1800   PROTEINUR Negative 06/19/2023 1020   PROTEINUR 30 (A) 07/11/2016 1800   UROBILINOGEN 0.2 05/10/2023 0821   NITRITE Negative 06/19/2023 1020   NITRITE POSITIVE (A) 07/11/2016 1800   LEUKOCYTESUR Negative 06/19/2023 1020   LEUKOCYTESUR Trace 03/13/2014 1903    Pertinent Imaging:   Assessment & Plan: By history patient has mixed incontinence of both are significant.  She does have moderately low  leak point pressures which could explain her high-volume episodes but this may also be due to bladder overactivity not detected during the test.  She also has bedwetting.  He also says she gets very little warning.  Role of medical and behavioral therapy discussed.  If she does not reach her treatment goal a sling and a bulking agent would need to be discussed recognizing that her OAB symptom complex could persist or worsen.  I would mention refractory therapies to her but if she is bothered by stress incontinence the stress procedures would likely be the best treatment path for her  Assessment 6 weeks on Gemtesa samples and prescription.  Call if culture positive.  Physical therapy consultation given which I think is an excellent idea based on her age and severity   There are no diagnoses linked to this encounter.  No follow-ups on file.  Martina Sinner, MD  [facility] [address], [address] [place], [place] [address] [phone]

## 2023-08-15 LAB — URINALYSIS, COMPLETE
Bilirubin, UA: NEGATIVE
Glucose, UA: NEGATIVE
Ketones, UA: NEGATIVE
Leukocytes,UA: NEGATIVE
Nitrite, UA: NEGATIVE
Protein,UA: NEGATIVE
RBC, UA: NEGATIVE
Specific Gravity, UA: >1.03 — ABNORMAL HIGH (ref 1.005–1.030)
Urobilinogen, Ur: 0.2 mg/dL (ref 0.2–1.0)
pH, UA: 6 (ref 5.0–7.5)

## 2023-08-15 LAB — MICROSCOPIC EXAMINATION

## 2023-08-17 LAB — CULTURE, URINE COMPREHENSIVE

## 2023-09-17 ENCOUNTER — Ambulatory Visit
Admission: EM | Admit: 2023-09-17 | Discharge: 2023-09-17 | Disposition: A | Discharge status: Home / Self Care | Payer: BC Managed Care – PPO | Attending: Emergency Medicine | Admitting: Emergency Medicine

## 2023-09-17 DIAGNOSIS — B349 Viral infection, unspecified: Secondary | ICD-10-CM | POA: Diagnosis not present

## 2023-09-17 LAB — POC COVID19/FLU A&B COMBO
Covid Antigen, POC: NEGATIVE
Influenza A Antigen, POC: NEGATIVE
Influenza B Antigen, POC: NEGATIVE

## 2023-09-17 NOTE — ED Triage Notes (Signed)
Patient to Urgent Care with complaints of generalized body aches/ sinus pressure/ nasal congestion. Possible fevers.   Symptoms started on Friday. Worsened yesterday. Works at an [employer].   Taking Advil cold/ flu.

## 2023-09-17 NOTE — Discharge Instructions (Addendum)
Your COVID and flu tests are negative.    Take Tylenol as needed for fever or discomfort.  Take plain Mucinex as needed for congestion.  Rest and keep yourself hydrated.    Follow-up with your primary care provider if your symptoms are not improving.

## 2023-09-17 NOTE — ED Provider Notes (Signed)
Renaldo Fiddler    CSN: [id number] Arrival date & time: 09/17/23  0801      History   Chief Complaint Chief Complaint  Patient presents with   Generalized Body Aches    HPI Katherine Ruiz is a 32 y.o. female.  Patient presents with 2 day history of fever, body aches, runny nose, congestion, runny nose, hoarse voice, occasional cough.  She has had diarrhea x 2 episodes this morning.  No sore throat, shortness of breath, vomiting, or other symptoms.  No OTC medications taken today; took cold medication yesterday.  Her medical history includes asthma.  She was seen by her PCP on 08/01/2023 for asthma exacerbation; treated with doxycycline and prednisone.   The history is provided by the patient and medical records.    Past Medical History:  Diagnosis Date   Anxiety    Arthritis    Asthma    Cervical radiculopathy 09/01/2018   Chest pain 06/01/2022   Chickenpox    COVID-19 virus infection 10/24/2022   Depression    Family history of breast cancer    4/21 cancer genetic testing letter sent   Hematuria 11/17/2022   History of kidney stones    History of MRSA infection 10/17/2014   Formatting of this note might be different from the original. Hospitalized at [facility] 08/2014. 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   Asthma 08/15/2018   Vaginal itching 02/26/2018    Past Surgical History:  Procedure Laterality Date   DILATATION AND  CURETTAGE/HYSTEROSCOPY WITH MINERVA N/A 09/02/2021   Procedure: DILATATION AND CURETTAGE/HYSTEROSCOPY WITH MINERVA;  Surgeon: Nadara Mustard, MD;  Location: [facility] ORS;  Service: Gynecology;  Laterality: N/A;   FOOT SURGERY Left 11/14/2012   screw in foot   LAPAROSCOPIC BILATERAL SALPINGECTOMY Bilateral 09/02/2021   Procedure: LAPAROSCOPIC BILATERAL SALPINGECTOMY;  Surgeon: Nadara Mustard, MD;  Location: [facility] ORS;  Service: Gynecology;  Laterality: Bilateral;   TUBAL LIGATION     WOUND DEBRIDEMENT Right 12/2012   inner thigh with drain ( spider bite MRSA)    OB History     Gravida  2   Para  2   Term  2   Preterm      AB      Living  2      SAB      IAB      Ectopic      Multiple      Live Births  2            Home Medications    Prior to Admission medications   Medication Sig Start Date End Date Taking? Authorizing Provider  albuterol (VENTOLIN HFA) 108 (90 Base) MCG/ACT inhaler Inhale 2 puffs into the lungs every 6 (six) hours as needed for shortness of breath. 12/20/22   Doreene Nest, NP  Budesonide (PULMICORT FLEXHALER) 90 MCG/ACT inhaler Inhale 1-2 puffs into the lungs 2 (two) times daily. For asthma 01/04/23  Doreene Nest, NP  busPIRone (BUSPAR) 10 MG tablet TAKE 1 TABLET BY MOUTH 2 TIMES DAILY FOR ANXIETY 08/06/23   Doreene Nest, NP  cetirizine (ZYRTEC) 10 MG tablet Take 10 mg by mouth in the morning.    [provider]  DULoxetine (CYMBALTA) 60 MG capsule TAKE 1 CAPSULE BY MOUTH DAILY FOR ANXIETY AND DEPRESSION. 08/06/23   Doreene Nest, NP  Vibegron (GEMTESA) 75 MG TABS Take 1 tablet (75 mg total) by mouth daily. 08/14/23   Alfredo Martinez, MD    Family History Family History  Problem Relation Age of Onset   Arthritis Father    Hyperlipidemia Father    Hypertension Father    Mental illness Father    Diabetes Father    Hyperlipidemia Maternal Grandmother    Arthritis Maternal Grandmother    Breast cancer Maternal  Grandmother    Hypertension Maternal Grandmother    Mental illness Maternal Grandmother    Diabetes Maternal Grandmother    Cancer Maternal Grandmother 31       Breast   Cancer - Other Maternal Grandmother        bile duct   Arthritis Maternal Grandfather    Mental illness Paternal Grandmother    Hypertension Paternal Grandmother    Arthritis Paternal Grandmother    Arthritis Paternal Grandfather    Mental illness Paternal Grandfather    Hypertension Paternal Grandfather     Social History Social History   Tobacco Use   Smoking status: Former    Current packs/day: 0.00    Types: Cigarettes    Quit date: 10/04/2012    Years since quitting: 10.9    Passive exposure: Past   Smokeless tobacco: Never  Vaping Use   Vaping status: Never Used  Substance Use Topics   Alcohol use: Yes    Comment: occasionally   Drug use: Yes    Types: Marijuana     Allergies   Vancomycin, Lamotrigine, and Septra [sulfamethoxazole-trimethoprim]   Review of Systems Review of Systems  Constitutional:  Positive for fever. Negative for chills.  HENT:  Positive for congestion, postnasal drip, rhinorrhea, sinus pressure and voice change. Negative for ear pain and sore throat.   Respiratory:  Positive for cough. Negative for shortness of breath.   Cardiovascular:  Negative for chest pain and palpitations.  Gastrointestinal:  Positive for diarrhea. Negative for abdominal pain and vomiting.  Skin:  Negative for color change and rash.     Physical Exam Triage Vital Signs ED Triage Vitals  Encounter Vitals Group     BP      Systolic BP Percentile      Diastolic BP Percentile      Pulse      Resp      Temp      Temp src      SpO2      Weight      Height      Head Circumference      Peak Flow      Pain Score      Pain Loc      Pain Education      Exclude from Growth Chart    No data found.  Updated Vital Signs BP 119/80   Pulse 72   Temp 99.7 F (37.6 C)   Resp 18   SpO2 97%    Visual Acuity Right Eye Distance:   Left Eye Distance:   Bilateral Distance:    Right Eye Near:   Left  Eye Near:    Bilateral Near:     Physical Exam Constitutional:      General: She is not in acute distress. HENT:     Right Ear: Tympanic membrane normal.     Left Ear: Tympanic membrane normal.     Nose: Congestion present.     Mouth/Throat:     Mouth: Mucous membranes are moist.     Pharynx: Oropharynx is clear.  Cardiovascular:     Rate and Rhythm: Normal rate and regular rhythm.     Heart sounds: Normal heart sounds.  Pulmonary:     Effort: Pulmonary effort is normal. No respiratory distress.     Breath sounds: Normal breath sounds. No wheezing.  Skin:    General: Skin is warm and dry.  Neurological:     Mental Status: She is alert.      UC Treatments / Results  Labs (all labs ordered are listed, but only abnormal results are displayed) Labs Reviewed  POC COVID19/FLU A&B COMBO    EKG   Radiology No results found.  Procedures Procedures (including critical care time)  Medications Ordered in UC Medications - No data to display  Initial Impression / Assessment and Plan / UC Course  I have reviewed the triage vital signs and the nursing notes.  Pertinent labs & imaging results that were available during my care of the patient were reviewed by me and considered in my medical decision making (see chart for details).    Viral illness.  Rapid COVID and flu negative.  Discussed symptomatic treatment including Tylenol, plain Mucinex, rest, hydration.  Instructed patient to follow up with her PCP if her symptoms are not improving.  She agrees to plan of care.   Final Clinical Impressions(s) / UC Diagnoses   Final diagnoses:  Viral illness     Discharge Instructions      Your COVID and flu tests are negative.    Take Tylenol as needed for fever or discomfort.  Take plain Mucinex as needed for congestion.  Rest and keep yourself hydrated.     Follow-up with your primary care provider if your symptoms are not improving.         ED Prescriptions   None    PDMP not reviewed this encounter.   Mickie Bail, NP 09/17/23 [phone]

## 2023-09-18 ENCOUNTER — Encounter: Payer: Self-pay | Admitting: *Deleted

## 2023-09-25 ENCOUNTER — Ambulatory Visit: Payer: BC Managed Care – PPO | Admitting: Urology

## 2023-11-11 ENCOUNTER — Other Ambulatory Visit: Payer: Self-pay | Admitting: Primary Care

## 2023-11-11 DIAGNOSIS — F4323 Adjustment disorder with mixed anxiety and depressed mood: Secondary | ICD-10-CM

## 2023-11-12 NOTE — Telephone Encounter (Signed)
Patient is due for CPE/follow up in January 2025, this will be required prior to any further refills.  Please schedule, thank you!

## 2023-11-13 NOTE — Telephone Encounter (Signed)
LVM for patient to c/b and schedule.  

## 2023-11-27 ENCOUNTER — Ambulatory Visit: Payer: BC Managed Care – PPO | Admitting: Internal Medicine

## 2023-11-27 ENCOUNTER — Encounter: Payer: Self-pay | Admitting: Internal Medicine

## 2023-11-27 ENCOUNTER — Ambulatory Visit: Payer: Self-pay | Admitting: Primary Care

## 2023-11-27 VITALS — BP 102/76 | HR 67 | Temp 98.5°F | Ht 61.5 in | Wt 217.0 lb

## 2023-11-27 DIAGNOSIS — M79672 Pain in left foot: Secondary | ICD-10-CM | POA: Diagnosis not present

## 2023-11-27 NOTE — Progress Notes (Signed)
 Subjective:    Patient ID: Katherine Ruiz, female    DOB: 1991/06/29, 33 y.o.   MRN: [id number]  HPI Here due to left foot pain  Has had about 2 months of trouble with left foot--mostly irritating Does have some pain at times she relates from past surgery--screw there (broke metatarsal as child--then had repair and screw at start of 2nd toe)  Got worse so was trying to use elevation/heat/ice Then really worse last night --noted hard area along side and to bottom (like in a vein) That spot is very painful--avoiding that area when walking---walking on insides of foot  Ibuprofen  600mg  twice---may helps some (but not clear cut)  Current Outpatient Medications on File Prior to Visit  Medication Sig Dispense Refill   albuterol  (VENTOLIN  HFA) 108 (90 Base) MCG/ACT inhaler Inhale 2 puffs into the lungs every 6 (six) hours as needed for shortness of breath. 1 each 0   Budesonide  (PULMICORT  FLEXHALER) 90 MCG/ACT inhaler Inhale 1-2 puffs into the lungs 2 (two) times daily. For asthma 1 each 0   busPIRone  (BUSPAR ) 10 MG tablet TAKE 1 TABLET BY MOUTH 2 TIMES DAILY FOR ANXIETY 180 tablet 0   cetirizine (ZYRTEC) 10 MG tablet Take 10 mg by mouth in the morning.     DULoxetine  (CYMBALTA ) 60 MG capsule TAKE 1 CAPSULE BY MOUTH DAILY FOR ANXIETY AND DEPRESSION. 90 capsule 0   Vibegron  (GEMTESA ) 75 MG TABS Take 1 tablet (75 mg total) by mouth daily.     No current facility-administered medications on file prior to visit.    Allergies  Allergen Reactions   Vancomycin  Swelling    Face and throat   Lamotrigine Hives   Septra [Sulfamethoxazole-Trimethoprim] Rash    Past Medical History:  Diagnosis Date   Anxiety    Arthritis    Asthma    Cervical radiculopathy 09/01/2018   Chest pain 06/01/2022   Chickenpox    COVID-19 virus infection 10/24/2022   Depression    Family history of breast cancer    4/21 cancer genetic testing letter sent   Hematuria 11/17/2022   History of kidney stones     History of MRSA infection 10/17/2014   Formatting of this note might be different from the original. Hospitalized at [facility] 08/2014. I+D, IV antibiotics   History of UTI    MRSA (methicillin resistant staph aureus) culture positive 2004   spider bite   Muscle spasm 03/29/2022   Neck pain 03/29/2022   Pyelonephritis 11/17/2022   S/P endometrial ablation 09/15/2021   S/P tubal ligation 09/15/2021    Past Surgical History:  Procedure Laterality Date   DILATATION AND CURETTAGE/HYSTEROSCOPY WITH MINERVA N/A 09/02/2021   Procedure: DILATATION AND CURETTAGE/HYSTEROSCOPY WITH MINERVA;  Surgeon: Arloa Lamar SQUIBB, MD;  Location: [facility] ORS;  Service: Gynecology;  Laterality: N/A;   FOOT SURGERY Left 11/14/2012   screw in foot   LAPAROSCOPIC BILATERAL SALPINGECTOMY Bilateral 09/02/2021   Procedure: LAPAROSCOPIC BILATERAL SALPINGECTOMY;  Surgeon: Arloa Lamar SQUIBB, MD;  Location: [facility] ORS;  Service: Gynecology;  Laterality: Bilateral;   TUBAL LIGATION     WOUND DEBRIDEMENT Right 12/2012   inner thigh with drain ( spider bite MRSA)    Family History  Problem Relation Age of Onset   Arthritis Father    Hyperlipidemia Father    Hypertension Father    Mental illness Father    Diabetes Father    Hyperlipidemia Maternal Grandmother    Arthritis Maternal Grandmother    Breast cancer Maternal Grandmother  Hypertension Maternal Grandmother    Mental illness Maternal Grandmother    Diabetes Maternal Grandmother    Cancer Maternal Grandmother 76       Breast   Cancer - Other Maternal Grandmother        bile duct   Arthritis Maternal Grandfather    Mental illness Paternal Grandmother    Hypertension Paternal Grandmother    Arthritis Paternal Grandmother    Arthritis Paternal Grandfather    Mental illness Paternal Grandfather    Hypertension Paternal Grandfather     Social History   Socioeconomic History   Marital status: Married    Spouse name: Not on file   Number of  children: Not on file   Years of education: Not on file   Highest education level: Not on file  Occupational History   Not on file  Tobacco Use   Smoking status: Former    Current packs/day: 0.00    Types: Cigarettes    Quit date: 10/04/2012    Years since quitting: 11.1    Passive exposure: Past   Smokeless tobacco: Never  Vaping Use   Vaping status: Never Used  Substance and Sexual Activity   Alcohol use: Yes    Comment: occasionally   Drug use: Yes    Types: Marijuana   Sexual activity: Yes  Other Topics Concern   Not on file  Social History Narrative   Married   2 children born 2010 and 2011, both girls   Works as an Airline Pilot for [employer]   Enjoys walking, running, spending time with her family   12/22/2016   Social Drivers of Corporate Investment Banker Strain: Not on file  Food Insecurity: Not on file  Transportation Needs: Not on file  Physical Activity: Not on file  Stress: Not on file  Social Connections: Unknown (03/17/2022)   Received from [facility], [facility]   Social Network    Social Network: Not on file  Intimate Partner Violence: Unknown (02/16/2022)   Received from [facility], [facility]   HITS    Physically Hurt: Not on file    Insult or Talk Down To: Not on file    Threaten Physical Harm: Not on file    Scream or Curse: Not on file   Review of Systems No known injury No fever No change in shoes     Objective:   Physical Exam Musculoskeletal:     Comments: Left foot ---no tenderness in metatarsals, MTP or toes Hard linear area across mid arch medially to plantar surface--some tenderness  Both feet with symmetric but not very preserved arches            Assessment & Plan:

## 2023-11-27 NOTE — Telephone Encounter (Addendum)
 Copied from CRM [id number]. Topic: Clinical - Red Word Triage >> Nov 27, 2023  8:38 AM Susanna ORN wrote: Red Word that prompted transfer to Nurse Triage: Patient states she has a vein/artery going to bottom of her left foot. States it hurts & it's very hard & swollen. Also states it hurts to touch or walk.   Chief Complaint: left foot swelling Symptoms: 7/10 pain with walking calf cramping Frequency: ongoing since 11/26/2023 Pertinent Negatives: Patient denies redness Disposition: [] ED /[] Urgent Care (no appt availability in office) / [x] Appointment(In office/virtual)/ []  [facility]/ [] Home Care/ [] Refused Recommended Disposition /[] [facility]/ []  Follow-up with PCP Additional Notes: The patient reported ongoing left foot pain that goes from her arch midway to the sole of her foot.  She had surgery on that foot about 10 years ago.  The pain has been ongoing for two months however, left foot swelling and calf cramping started yesterday.  She reported a hard vein in her foot and increased pain with walking 7/10.  She was scheduled for a same day appointment for further evaluation.   Reason for Disposition  [1] Swollen foot AND [2] no fever  (Exceptions: localized bump from bunions, calluses, insect bite, sting)  Answer Assessment - Initial Assessment Questions 1. ONSET: When did the pain start?  Foot pain ongoing for two months      11/26/23 - swelling and leg cramp  2. LOCATION: Where is the pain located?      Left foot  3. PAIN: How bad is the pain?    (Scale 1-10; or mild, moderate, severe)  - MILD (1-3): doesn't interfere with normal activities.   - MODERATE (4-7): interferes with normal activities (e.g., work or school) or awakens from sleep, limping.   - SEVERE (8-10): excruciating pain, unable to do any normal activities, unable to walk.      7/10 with stepping Feels tight at rest  4. WORK OR EXERCISE: Has there been any recent work or exercise that  involved this part of the body?      No  5. CAUSE: What do you think is causing the foot pain?     Previous surgery 10 years ago  - bunion surgery and placed screw 6. OTHER SYMPTOMS: Do you have any other symptoms? (e.g., leg pain, rash, fever, numbness)     Left calf feels cramp Some swelling in foot  Protocols used: Foot Pain-A-AH

## 2023-11-27 NOTE — Assessment & Plan Note (Signed)
 Has pain and a cord along the left arch Doesn't seem to be bony related Will set up with podiatry

## 2023-11-27 NOTE — Telephone Encounter (Signed)
 Patient is currently in office seeing Dr. Alphonsus Sias.

## 2024-03-26 ENCOUNTER — Encounter: Payer: Self-pay | Admitting: Primary Care

## 2024-03-26 ENCOUNTER — Ambulatory Visit: Admitting: Primary Care

## 2024-03-26 VITALS — BP 118/70 | HR 71 | Temp 97.2°F | Ht 61.5 in | Wt 210.0 lb

## 2024-03-26 DIAGNOSIS — R102 Pelvic and perineal pain: Secondary | ICD-10-CM | POA: Diagnosis not present

## 2024-03-26 DIAGNOSIS — R3915 Urgency of urination: Secondary | ICD-10-CM

## 2024-03-26 LAB — POC URINALSYSI DIPSTICK (AUTOMATED)
Bilirubin, UA: NEGATIVE
Blood, UA: NEGATIVE
Glucose, UA: NEGATIVE
Ketones, UA: NEGATIVE
Leukocytes, UA: NEGATIVE
Nitrite, UA: NEGATIVE
Protein, UA: NEGATIVE
Spec Grav, UA: 1.01 (ref 1.010–1.025)
Urobilinogen, UA: 0.2 U/dL
pH, UA: 6 (ref 5.0–8.0)

## 2024-03-26 NOTE — Patient Instructions (Signed)
 You will be in touch soon regarding your swab results.  It was a pleasure to see you today!

## 2024-03-26 NOTE — Progress Notes (Signed)
 Subjective:    Patient ID: Katherine Ruiz, female    DOB: 25-Oct-1991, 33 y.o.   MRN: [id number]  Hematuria Pertinent negatives include no dysuria, fever or flank pain.    Katherine Ruiz is a very pleasant 33 y.o. female with a history of acute cystitis, vaginal itching, urinary urgency, urinary incontinence who presents today to discuss UTI symptoms.  Two days ago she noticed urinary urgency with pelvic pressure and little urine output. She took AZO and drank cranberry juice and noticed an improvement in her urine flow but has noticed bright red blood when urinating.   She continues to notice lower back pain and pelvic cramping. She has not had a menstrual cycle in 4 years. She has had unprotected intercourse with a new partner.   She denies fevers, vaginal itching, vaginal discharge.   Review of Systems  Constitutional:  Negative for fever.  Genitourinary:  Positive for hematuria and pelvic pain. Negative for dysuria, flank pain, vaginal bleeding and vaginal discharge.  Musculoskeletal:  Positive for back pain.         Past Medical History:  Diagnosis Date   Anxiety    Arthritis    Asthma    Cervical radiculopathy 09/01/2018   Chest pain 06/01/2022   Chickenpox    COVID-19 virus infection 10/24/2022   Depression    Family history of breast cancer    4/21 cancer genetic testing letter sent   Hematuria 11/17/2022   History of kidney stones    History of MRSA infection 10/17/2014   Formatting of this note might be different from the original. Hospitalized at [facility] 08/2014. I+D, IV antibiotics   History of UTI    MRSA (methicillin resistant staph aureus) culture positive 2004   spider bite   Muscle spasm 03/29/2022   Neck pain 03/29/2022   Pyelonephritis 11/17/2022   S/P endometrial ablation 09/15/2021   S/P tubal ligation 09/15/2021    Social History   Socioeconomic History   Marital status: Married    Spouse name: Not on file   Number of  children: Not on file   Years of education: Not on file   Highest education level: Not on file  Occupational History   Not on file  Tobacco Use   Smoking status: Former    Current packs/day: 0.00    Types: Cigarettes    Quit date: 10/04/2012    Years since quitting: 11.4    Passive exposure: Past   Smokeless tobacco: Never  Vaping Use   Vaping status: Never Used  Substance and Sexual Activity   Alcohol use: Yes    Comment: occasionally   Drug use: Yes    Types: Marijuana   Sexual activity: Yes  Other Topics Concern   Not on file  Social History Narrative   Married   2 children born 2010 and 2011, both girls   Works as an Airline pilot for an [employer]   Enjoys walking, running, spending time with her family   12/22/2016   Social Drivers of Corporate investment banker Strain: Not on file  Food Insecurity: Not on file  Transportation Needs: Not on file  Physical Activity: Not on file  Stress: Not on file  Social Connections: Unknown (03/17/2022)   Received from [facility], [facility]   Social Network    Social Network: Not on file  Intimate Partner Violence: Unknown (02/16/2022)   Received from [facility], [facility]   HITS    Physically Hurt:  Not on file    Insult or Talk Down To: Not on file    Threaten Physical Harm: Not on file    Scream or Curse: Not on file    Past Surgical History:  Procedure Laterality Date   DILATATION AND CURETTAGE/HYSTEROSCOPY WITH MINERVA N/A 09/02/2021   Procedure: DILATATION AND CURETTAGE/HYSTEROSCOPY WITH MINERVA;  Surgeon: Alben Alma, MD;  Location: [facility] ORS;  Service: Gynecology;  Laterality: N/A;   FOOT SURGERY Left 11/14/2012   screw in foot   LAPAROSCOPIC BILATERAL SALPINGECTOMY Bilateral 09/02/2021   Procedure: LAPAROSCOPIC BILATERAL SALPINGECTOMY;  Surgeon: Alben Alma, MD;  Location: [facility] ORS;  Service: Gynecology;  Laterality: Bilateral;   TUBAL LIGATION     WOUND DEBRIDEMENT Right 12/2012    inner thigh with drain ( spider bite MRSA)    Family History  Problem Relation Age of Onset   Arthritis Father    Hyperlipidemia Father    Hypertension Father    Mental illness Father    Diabetes Father    Hyperlipidemia Maternal Grandmother    Arthritis Maternal Grandmother    Breast cancer Maternal Grandmother    Hypertension Maternal Grandmother    Mental illness Maternal Grandmother    Diabetes Maternal Grandmother    Cancer Maternal Grandmother 48       Breast   Cancer - Other Maternal Grandmother        bile duct   Arthritis Maternal Grandfather    Mental illness Paternal Grandmother    Hypertension Paternal Grandmother    Arthritis Paternal Grandmother    Arthritis Paternal Grandfather    Mental illness Paternal Grandfather    Hypertension Paternal Grandfather     Allergies  Allergen Reactions   Vancomycin  Swelling    Face and throat   Lamotrigine Hives   Septra [Sulfamethoxazole-Trimethoprim] Rash    Current Outpatient Medications on File Prior to Visit  Medication Sig Dispense Refill   albuterol  (VENTOLIN  HFA) 108 (90 Base) MCG/ACT inhaler Inhale 2 puffs into the lungs every 6 (six) hours as needed for shortness of breath. 1 each 0   Budesonide  (PULMICORT  FLEXHALER) 90 MCG/ACT inhaler Inhale 1-2 puffs into the lungs 2 (two) times daily. For asthma 1 each 0   busPIRone  (BUSPAR ) 10 MG tablet TAKE 1 TABLET BY MOUTH 2 TIMES DAILY FOR ANXIETY 180 tablet 0   cetirizine (ZYRTEC) 10 MG tablet Take 10 mg by mouth in the morning.     DULoxetine  (CYMBALTA ) 60 MG capsule TAKE 1 CAPSULE BY MOUTH DAILY FOR ANXIETY AND DEPRESSION. 90 capsule 0   Vibegron  (GEMTESA ) 75 MG TABS Take 1 tablet (75 mg total) by mouth daily.     No current facility-administered medications on file prior to visit.    BP 118/70   Pulse 71   Temp (!) 97.2 F (36.2 C) (Temporal)   Ht 5' 1.5" (1.562 m)   Wt 210 lb (95.3 kg)   SpO2 98%   BMI 39.04 kg/m  Objective:   Physical Exam Exam  conducted with a chaperone present.  Constitutional:      General: She is not in acute distress. Cardiovascular:     Rate and Rhythm: Normal rate.  Pulmonary:     Effort: Pulmonary effort is normal.  Genitourinary:    Labia:        Right: No rash, tenderness or lesion.        Left: No rash, tenderness or lesion.      Vagina: Normal.     Cervix: Discharge  present.     Uterus: Normal.      Comments: Scant amount of brown discharge Skin:    General: Skin is warm and dry.  Neurological:     Mental Status: She is alert.           Assessment & Plan:  Pelvic cramping Assessment & Plan: Urinalysis negative.  Wet prep, gonorrhea, chlamydia swabs ordered and pending. Exam today slightly representative of vaginal bleeding from menses.  Difficult to tell.  Await results.  Orders: -     C. trachomatis/N. gonorrhoeae RNA -     WET PREP BY MOLECULAR PROBE  Urinary urgency -     POCT Urinalysis Dipstick (Automated) -     C. trachomatis/N. gonorrhoeae RNA -     WET PREP BY MOLECULAR PROBE        Gabriel John, NP

## 2024-03-26 NOTE — Assessment & Plan Note (Signed)
 Urinalysis negative.  Wet prep, gonorrhea, chlamydia swabs ordered and pending. Exam today slightly representative of vaginal bleeding from menses.  Difficult to tell.  Await results.

## 2024-03-28 ENCOUNTER — Ambulatory Visit: Payer: Self-pay | Admitting: Primary Care

## 2024-03-28 LAB — WET PREP BY MOLECULAR PROBE
Candida species: NOT DETECTED
Gardnerella vaginalis: NOT DETECTED
MICRO NUMBER:: 16448908
SPECIMEN QUALITY:: ADEQUATE
Trichomonas vaginosis: NOT DETECTED

## 2024-03-28 LAB — C. TRACHOMATIS/N. GONORRHOEAE RNA
C. trachomatis RNA, TMA: NOT DETECTED
N. gonorrhoeae RNA, TMA: NOT DETECTED

## 2024-04-13 ENCOUNTER — Other Ambulatory Visit: Payer: Self-pay | Admitting: Primary Care

## 2024-04-13 DIAGNOSIS — F4323 Adjustment disorder with mixed anxiety and depressed mood: Secondary | ICD-10-CM

## 2024-04-14 NOTE — Telephone Encounter (Signed)
 Patient is due for CPE/follow up, this will be required prior to any further refills.  Please schedule, thank you!

## 2024-04-15 NOTE — Telephone Encounter (Signed)
 Spoke to pt, scheduled cpe for 04/26/24

## 2024-04-26 ENCOUNTER — Encounter: Admitting: Primary Care

## 2024-04-30 ENCOUNTER — Encounter: Payer: Self-pay | Admitting: Primary Care

## 2024-04-30 ENCOUNTER — Ambulatory Visit (INDEPENDENT_AMBULATORY_CARE_PROVIDER_SITE_OTHER): Admitting: Primary Care

## 2024-04-30 ENCOUNTER — Other Ambulatory Visit (HOSPITAL_COMMUNITY)
Admission: RE | Admit: 2024-04-30 | Discharge: 2024-04-30 | Disposition: A | Discharge status: Home / Self Care | Source: Ambulatory Visit | Attending: Primary Care | Admitting: Primary Care

## 2024-04-30 VITALS — BP 118/76 | HR 62 | Temp 97.9°F | Ht 61.5 in | Wt 211.0 lb

## 2024-04-30 DIAGNOSIS — Z23 Encounter for immunization: Secondary | ICD-10-CM

## 2024-04-30 DIAGNOSIS — Z124 Encounter for screening for malignant neoplasm of cervix: Secondary | ICD-10-CM | POA: Diagnosis present

## 2024-04-30 DIAGNOSIS — N3946 Mixed incontinence: Secondary | ICD-10-CM

## 2024-04-30 DIAGNOSIS — F4323 Adjustment disorder with mixed anxiety and depressed mood: Secondary | ICD-10-CM | POA: Diagnosis not present

## 2024-04-30 DIAGNOSIS — J4520 Mild intermittent asthma, uncomplicated: Secondary | ICD-10-CM | POA: Diagnosis not present

## 2024-04-30 DIAGNOSIS — Z0001 Encounter for general adult medical examination with abnormal findings: Secondary | ICD-10-CM

## 2024-04-30 DIAGNOSIS — R21 Rash and other nonspecific skin eruption: Secondary | ICD-10-CM | POA: Diagnosis not present

## 2024-04-30 MED ORDER — DULOXETINE HCL 30 MG PO CPEP: 30.0000 mg | ORAL_CAPSULE | Freq: Every day | ORAL | 0 refills | Status: DC

## 2024-04-30 MED ORDER — ALBUTEROL SULFATE HFA 108 (90 BASE) MCG/ACT IN AERS: 2.0000 | INHALATION_SPRAY | Freq: Four times a day (QID) | RESPIRATORY_TRACT | 0 refills | Status: DC | PRN

## 2024-04-30 MED ORDER — TRIAMCINOLONE ACETONIDE 0.5 % EX OINT: 1.0000 | TOPICAL_OINTMENT | Freq: Two times a day (BID) | CUTANEOUS | 0 refills | Status: DC

## 2024-04-30 NOTE — Patient Instructions (Addendum)
 We increased the dose of your Cymbalta  to 90 mg daily.  Take both the 60 mg pill and the 30 mg pill together.  You may apply the triamcinolone  ointment twice daily for 1 to 2 weeks.  We will be in touch again once we have the Pap smear results.  It was a pleasure to see you today!

## 2024-04-30 NOTE — Assessment & Plan Note (Signed)
 Deteriorated.  Increase Cymbalta  to 90 mg daily.  Continue 60 mg capsule, add 30 mg capsule daily. She will update.  Discontinue buspirone .

## 2024-04-30 NOTE — Assessment & Plan Note (Signed)
Following with Urology.  Continue Gemtesa 75 mg daily.

## 2024-04-30 NOTE — Assessment & Plan Note (Signed)
 Overall controlled.  Continue albuterol  PRN for which she uses sparingly. Continue pulmicort  daily during seasonal changes.

## 2024-04-30 NOTE — Assessment & Plan Note (Addendum)
 Tetanus vaccine provided today  Pap smear due, completed today.  Discussed the importance of a healthy diet and regular exercise in order for weight loss, and to reduce the risk of further co-morbidity.  Exam stable.  Follow up in 1 year for repeat physical.

## 2024-04-30 NOTE — Progress Notes (Signed)
 Subjective:    Patient ID: Katherine Ruiz, female    DOB: [birth year], 33 y.o.   MRN: [id number]  HPI  Katherine Ruiz is a very pleasant 33 y.o. female who presents today for complete physical and follow up of chronic conditions.  She would also like to discuss chronic anxiety. Chronic anxiety for years, over the last year she's felt more overwhelmed and anxious as she's going through a divorce, raising 2 teenagers, and is stressed with work. Currently managed on Cymbalta  60 mg daily which has helped some and buspirone  10 mg once daily which causes her to feel more nervous and anxious. She did notice improvement with the dose increase from 30 mg to 60 mg last year.   She would also like to discuss chronic rash. Chronic itchy rash to right anterior lower extremity since a terrible sunburn several years ago.  Since then the rash intermittently presents itself, typically with warmer weather, it is itchy and annoying.  She had a tattoo placed over the site of her terrible sunburn to cover the scarring.  She has tried over-the-counter cortisone cream and a topical steroid cream she had from an older prescription without improvement.  Immunizations: -Tetanus: Completed in 2015  Diet: Fair diet.  Exercise: No regular exercise.  Eye exam: Completes annually  Dental exam: Completes semi-annually    Pap Smear: Completed in 2021  BP Readings from Last 3 Encounters:  04/30/24 118/76  03/26/24 118/70  11/27/23 102/76       Review of Systems  Constitutional:  Negative for unexpected weight change.  HENT:  Negative for rhinorrhea.   Respiratory:  Negative for cough and shortness of breath.   Cardiovascular:  Negative for chest pain.  Gastrointestinal:  Negative for constipation and diarrhea.  Genitourinary:  Negative for difficulty urinating.  Musculoskeletal:  Negative for arthralgias and myalgias.  Skin:  Positive for rash.  Allergic/Immunologic: Negative for environmental allergies.   Neurological:  Negative for dizziness, numbness and headaches.  Psychiatric/Behavioral:  The patient is nervous/anxious.          Past Medical History:  Diagnosis Date   Anxiety    Arthritis    Asthma    Asthma exacerbation 08/01/2023   Cervical radiculopathy 09/01/2018   Chest pain 06/01/2022   Chickenpox    COVID-19 virus infection 10/24/2022   Depression    Elevated LFTs 11/30/2022   Family history of breast cancer    4/21 cancer genetic testing letter sent   Hematuria 11/17/2022   History of kidney stones    History of MRSA infection 10/17/2014   Formatting of this note might be different from the original. Hospitalized at [facility] 08/2014. I+D, IV antibiotics   History of UTI    MRSA (methicillin resistant staph aureus) culture positive 2004   spider bite   Muscle spasm 03/29/2022   Neck pain 03/29/2022   Peripheral edema 11/17/2022   Pyelonephritis 11/17/2022   S/P endometrial ablation 09/15/2021   S/P tubal ligation 09/15/2021    Social History   Socioeconomic History   Marital status: Married    Spouse name: Not on file   Number of children: Not on file   Years of education: Not on file   Highest education level: Not on file  Occupational History   Not on file  Tobacco Use   Smoking status: Former    Current packs/day: 0.00    Types: Cigarettes    Quit date: 10/04/2012    Years since quitting: 11.5  Passive exposure: Past   Smokeless tobacco: Never  Vaping Use   Vaping status: Never Used  Substance and Sexual Activity   Alcohol use: Yes    Comment: occasionally   Drug use: Yes    Types: Marijuana   Sexual activity: Yes  Other Topics Concern   Not on file  Social History Narrative   Married   2 children born 2010 and 2011, both girls   Works as an Airline pilot for an [employer]   Enjoys walking, running, spending time with her family   12/22/2016   Social Drivers of Corporate investment banker Strain: Not on file  Food  Insecurity: Not on file  Transportation Needs: Not on file  Physical Activity: Not on file  Stress: Not on file  Social Connections: Unknown (03/17/2022)   Received from [facility]   Social Network    Social Network: Not on file  Intimate Partner Violence: Unknown (02/16/2022)   Received from [facility]   HITS    Physically Hurt: Not on file    Insult or Talk Down To: Not on file    Threaten Physical Harm: Not on file    Scream or Curse: Not on file    Past Surgical History:  Procedure Laterality Date   DILATATION AND CURETTAGE/HYSTEROSCOPY WITH MINERVA N/A 09/02/2021   Procedure: DILATATION AND CURETTAGE/HYSTEROSCOPY WITH MINERVA;  Surgeon: Alben Alma, MD;  Location: [facility] ORS;  Service: Gynecology;  Laterality: N/A;   FOOT SURGERY Left 11/14/2012   screw in foot   LAPAROSCOPIC BILATERAL SALPINGECTOMY Bilateral 09/02/2021   Procedure: LAPAROSCOPIC BILATERAL SALPINGECTOMY;  Surgeon: Alben Alma, MD;  Location: [facility] ORS;  Service: Gynecology;  Laterality: Bilateral;   TUBAL LIGATION     WOUND DEBRIDEMENT Right 12/2012   inner thigh with drain ( spider bite MRSA)    Family History  Problem Relation Age of Onset   Arthritis Father    Hyperlipidemia Father    Hypertension Father    Mental illness Father    Diabetes Father    Hyperlipidemia Maternal Grandmother    Arthritis Maternal Grandmother    Breast cancer Maternal Grandmother    Hypertension Maternal Grandmother    Mental illness Maternal Grandmother    Diabetes Maternal Grandmother    Cancer Maternal Grandmother 21       Breast   Cancer - Other Maternal Grandmother        bile duct   Arthritis Maternal Grandfather    Mental illness Paternal Grandmother    Hypertension Paternal Grandmother    Arthritis Paternal Grandmother    Arthritis Paternal Grandfather    Mental illness Paternal Grandfather    Hypertension Paternal Grandfather     Allergies  Allergen Reactions   Vancomycin  Swelling    Face  and throat   Lamotrigine Hives   Septra [Sulfamethoxazole-Trimethoprim] Rash    Current Outpatient Medications on File Prior to Visit  Medication Sig Dispense Refill   Budesonide  (PULMICORT  FLEXHALER) 90 MCG/ACT inhaler Inhale 1-2 puffs into the lungs 2 (two) times daily. For asthma 1 each 0   busPIRone  (BUSPAR ) 10 MG tablet TAKE 1 TABLET BY MOUTH 2 TIMES DAILY FOR ANXIETY 180 tablet 0   cetirizine (ZYRTEC) 10 MG tablet Take 10 mg by mouth in the morning.     DULoxetine  (CYMBALTA ) 60 MG capsule TAKE 1 CAPSULE BY MOUTH DAILY FOR ANXIETY AND DEPRESSION. 90 capsule 0   Vibegron  (GEMTESA ) 75 MG TABS Take 1 tablet (75 mg total) by mouth  daily.     No current facility-administered medications on file prior to visit.    BP 118/76   Pulse 62   Temp 97.9 F (36.6 C) (Temporal)   Ht 5' 1.5 (1.562 m)   Wt 211 lb (95.7 kg)   BMI 39.22 kg/m  Objective:   Physical Exam Exam conducted with a chaperone present.  HENT:     Right Ear: Tympanic membrane and ear canal normal.     Left Ear: Tympanic membrane and ear canal normal.   Eyes:     Pupils: Pupils are equal, round, and reactive to light.    Cardiovascular:     Rate and Rhythm: Normal rate and regular rhythm.  Pulmonary:     Effort: Pulmonary effort is normal.     Breath sounds: Normal breath sounds.  Abdominal:     General: Bowel sounds are normal.     Palpations: Abdomen is soft.     Tenderness: There is no abdominal tenderness.  Genitourinary:    Labia:        Right: No rash, tenderness or lesion.        Left: No rash, tenderness or lesion.      Vagina: Normal.     Cervix: Normal.     Uterus: Normal.      Adnexa: Right adnexa normal and left adnexa normal.   Musculoskeletal:        General: Normal range of motion.     Cervical back: Neck supple.   Skin:    General: Skin is warm and dry.     Comments: Mild, raised, erythematous rash to right anterior lower extremity   Neurological:     Mental Status: She is alert  and oriented to person, place, and time.     Cranial Nerves: No cranial nerve deficit.     Deep Tendon Reflexes:     Reflex Scores:      Patellar reflexes are 2+ on the right side and 2+ on the left side.  Psychiatric:        Mood and Affect: Mood normal.           Assessment & Plan:  Encounter for annual general medical examination with abnormal findings in adult Assessment & Plan: Tetanus vaccine provided today  Pap smear due, completed today.  Discussed the importance of a healthy diet and regular exercise in order for weight loss, and to reduce the risk of further co-morbidity.  Exam stable.  Follow up in 1 year for repeat physical.    Mild intermittent asthma without complication Assessment & Plan: Overall controlled.  Continue albuterol  PRN for which she uses sparingly. Continue pulmicort  daily during seasonal changes.   Orders: -     Albuterol  Sulfate HFA; Inhale 2 puffs into the lungs every 6 (six) hours as needed for shortness of breath.  Dispense: 1 each; Refill: 0  Mixed stress and urge urinary incontinence Assessment & Plan: Following with Urology.  Continue Gemtesa  75 mg daily.   Adjustment reaction with anxiety and depression Assessment & Plan: Deteriorated.  Increase Cymbalta  to 90 mg daily.  Continue 60 mg capsule, add 30 mg capsule daily. She will update.  Discontinue buspirone .  Orders: -     DULoxetine  HCl; Take 1 capsule (30 mg total) by mouth daily. for anxiety and depression. Take with 60 mg pill  Dispense: 90 capsule; Refill: 0  Rash and nonspecific skin eruption Assessment & Plan: Exam today does appear eczema-like.  Start triamcinolone  0.5% ointment twice  daily x 1 to 2 weeks. She will update.  Orders: -     Triamcinolone  Acetonide; Apply 1 Application topically 2 (two) times daily.  Dispense: 30 g; Refill: 0  Screening for cervical cancer -     Cytology - PAP        Gabriel John, NP

## 2024-04-30 NOTE — Assessment & Plan Note (Signed)
 Exam today does appear eczema-like.  Start triamcinolone  0.5% ointment twice daily x 1 to 2 weeks. She will update.

## 2024-05-02 ENCOUNTER — Ambulatory Visit: Payer: Self-pay | Admitting: Primary Care

## 2024-05-02 LAB — CYTOLOGY - PAP
Adequacy: ABSENT
Comment: NEGATIVE
Diagnosis: NEGATIVE
High risk HPV: NEGATIVE

## 2024-05-24 ENCOUNTER — Other Ambulatory Visit: Payer: Self-pay | Admitting: Primary Care

## 2024-05-24 DIAGNOSIS — J4520 Mild intermittent asthma, uncomplicated: Secondary | ICD-10-CM

## 2024-07-23 ENCOUNTER — Other Ambulatory Visit: Payer: Self-pay | Admitting: Primary Care

## 2024-07-23 DIAGNOSIS — F4323 Adjustment disorder with mixed anxiety and depressed mood: Secondary | ICD-10-CM

## 2024-07-28 ENCOUNTER — Other Ambulatory Visit: Payer: Self-pay | Admitting: Primary Care

## 2024-07-28 DIAGNOSIS — F4323 Adjustment disorder with mixed anxiety and depressed mood: Secondary | ICD-10-CM

## 2024-08-24 ENCOUNTER — Other Ambulatory Visit: Payer: Self-pay

## 2024-08-24 ENCOUNTER — Encounter: Payer: Self-pay | Admitting: Emergency Medicine

## 2024-08-24 ENCOUNTER — Emergency Department

## 2024-08-24 ENCOUNTER — Emergency Department
Admission: EM | Admit: 2024-08-24 | Discharge: 2024-08-24 | Disposition: A | Discharge status: Home / Self Care | Attending: Emergency Medicine | Admitting: Emergency Medicine

## 2024-08-24 DIAGNOSIS — R350 Frequency of micturition: Secondary | ICD-10-CM | POA: Insufficient documentation

## 2024-08-24 DIAGNOSIS — R32 Unspecified urinary incontinence: Secondary | ICD-10-CM | POA: Diagnosis not present

## 2024-08-24 DIAGNOSIS — R109 Unspecified abdominal pain: Secondary | ICD-10-CM | POA: Insufficient documentation

## 2024-08-24 DIAGNOSIS — R10A1 Flank pain, right side: Secondary | ICD-10-CM

## 2024-08-24 LAB — URINALYSIS, ROUTINE W REFLEX MICROSCOPIC
Bacteria, UA: NONE SEEN
Bilirubin Urine: NEGATIVE
Glucose, UA: NEGATIVE mg/dL
Ketones, ur: NEGATIVE mg/dL
Leukocytes,Ua: NEGATIVE
Nitrite: NEGATIVE
Protein, ur: NEGATIVE mg/dL
RBC / HPF: >50 RBC/hpf (ref 0–5)
Specific Gravity, Urine: 1.015 (ref 1.005–1.030)
pH: 5 (ref 5.0–8.0)

## 2024-08-24 LAB — BASIC METABOLIC PANEL WITH GFR
Anion gap: 8 (ref 5–15)
BUN: 17 mg/dL (ref 6–20)
CO2: 27 mmol/L (ref 22–32)
Calcium: 9.1 mg/dL (ref 8.9–10.3)
Chloride: 103 mmol/L (ref 98–111)
Creatinine, Ser: 0.92 mg/dL (ref 0.44–1.00)
GFR, Estimated: >60 mL/min (ref >60)
Glucose, Bld: 84 mg/dL (ref 70–99)
Potassium: 3.8 mmol/L (ref 3.5–5.1)
Sodium: 138 mmol/L (ref 135–145)

## 2024-08-24 LAB — CBC
HCT: 37.1 % (ref 36.0–46.0)
Hemoglobin: 12.6 g/dL (ref 12.0–15.0)
MCH: 32.7 pg (ref 26.0–34.0)
MCHC: 34 g/dL (ref 30.0–36.0)
MCV: 96.4 fL (ref 80.0–100.0)
Platelets: 307 K/uL (ref 150–400)
RBC: 3.85 MIL/uL — ABNORMAL LOW (ref 3.87–5.11)
RDW: 12.4 % (ref 11.5–15.5)
WBC: 10.1 K/uL (ref 4.0–10.5)
nRBC: 0 % (ref 0.0–0.2)

## 2024-08-24 MED ORDER — ONDANSETRON 4 MG PO TBDP
4.0000 mg | ORAL_TABLET | Freq: Once | ORAL | Status: AC
  Administered 2024-08-24: 4 mg via ORAL
  Filled 2024-08-24: qty 1

## 2024-08-24 MED ORDER — OXYCODONE-ACETAMINOPHEN 5-325 MG PO TABS
1.0000 | ORAL_TABLET | Freq: Once | ORAL | Status: AC
  Administered 2024-08-24: 1 via ORAL
  Filled 2024-08-24: qty 1

## 2024-08-24 MED ORDER — OXYCODONE-ACETAMINOPHEN 5-325 MG PO TABS: 1.0000 | ORAL_TABLET | Freq: Four times a day (QID) | ORAL | 0 refills | Status: AC | PRN

## 2024-08-24 MED ORDER — KETOROLAC TROMETHAMINE 15 MG/ML IJ SOLN
15.0000 mg | Freq: Once | INTRAMUSCULAR | Status: AC
  Administered 2024-08-24: 15 mg via INTRAMUSCULAR
  Filled 2024-08-24: qty 1

## 2024-08-24 NOTE — ED Triage Notes (Signed)
 Pt via POV from home. Pt c/o R sided flank pain that started yesterday, started dull but became sharp this morning and now radiates to the R side of her abd. Denies any nausea but reports urinary frequency. Pt is A&Ox4 but does seem uncomfortable from the pain.

## 2024-08-24 NOTE — Discharge Instructions (Addendum)
 Your blood work was normal today, your urinalysis showed some microscopic blood in it. The CT scan did not show a kidney stone so I believe you may have already passed it or it is too small to see on the scan. Your symptoms continue to improve over time.  If things are not getting better please return to the emergency department, urgent care or your primary care provider for reevaluation.  You can take 650 mg of Tylenol  and 600 mg of ibuprofen  every 6 hours as needed for pain. You can use ice, heat, muscle creams and other topical pain relievers as well.  I have sent a strong pain medication called Percocet to the pharmacy.  This medication can be taken every 4-6 hours as needed for severe or breakthrough pain.  This medication can cause dependency so only take if you are unable to control your pain with other medications.  It will make you sleepy so do not drive or operate heavy machinery after taking it.  Do not drink alcohol while taking this medication.  This medication can also cause constipation so please take an over-the-counter stool softener like MiraLAX or Colace while taking it.  This medication contains both oxycodone  and acetaminophen .  Do not take Tylenol  at the same time.  You can take the Percocet or Tylenol  but not both.

## 2024-08-24 NOTE — ED Provider Notes (Signed)
 [facility] Provider Note    Event Date/Time   First MD Initiated Contact with Patient 08/24/24 1406     (approximate)   History   Flank Pain   HPI  Katherine Ruiz is a 33 y.o. female with PMH of stress and urge urinary incontinence, kidney stones presents for evaluation of right sided flank pain that began yesterday.  Patient reports that the pain started dull and has become more sharp.  The pain is constant but there.  Some increasing severity.  Patient denies nausea, vomiting, diarrhea, constipation, fevers and dysuria.  She does report a few episodes of urinary incontinence but this is normal for her.  She also endorses urinary frequency.      Physical Exam   Triage Vital Signs: ED Triage Vitals  Encounter Vitals Group     BP 08/24/24 1342 134/80     Girls Systolic BP Percentile --      Girls Diastolic BP Percentile --      Boys Systolic BP Percentile --      Boys Diastolic BP Percentile --      Pulse Rate 08/24/24 1342 70     Resp 08/24/24 1342 18     Temp 08/24/24 1342 98.4 F (36.9 C)     Temp Source 08/24/24 1342 Oral     SpO2 08/24/24 1342 98 %     Weight 08/24/24 1339 200 lb (90.7 kg)     Height 08/24/24 1339 5' 1 (1.549 m)     Head Circumference --      Peak Flow --      Pain Score 08/24/24 1338 9     Pain Loc --      Pain Education --      Exclude from Growth Chart --     Most recent vital signs: Vitals:   08/24/24 1342 08/24/24 1600  BP: 134/80 100/84  Pulse: 70 64  Resp: 18 16  Temp: 98.4 F (36.9 C)   SpO2: 98% 96%   General: Awake, does appear quite uncomfortable on exam. CV:  Good peripheral perfusion.  RRR. Resp:  Normal effort.  CTAB Abd:  No distention.  Soft, mild tenderness to palpation on the right side, no CVA tenderness bilaterally. Other:     ED Results / Procedures / Treatments   Labs (all labs ordered are listed, but only abnormal results are displayed) Labs Reviewed  URINALYSIS, ROUTINE W  REFLEX MICROSCOPIC - Abnormal; Notable for the following components:      Result Value   Color, Urine YELLOW (*)    APPearance HAZY (*)    Hgb urine dipstick SMALL (*)    All other components within normal limits  CBC - Abnormal; Notable for the following components:   RBC 3.85 (*)    All other components within normal limits  BASIC METABOLIC PANEL WITH GFR     RADIOLOGY  CT renal stone study obtained, interpreted the images as well as reviewed the radiologist report.  PROCEDURES:  Critical Care performed: No  Procedures   MEDICATIONS ORDERED IN ED: Medications  oxyCODONE -acetaminophen  (PERCOCET/ROXICET) 5-325 MG per tablet 1 tablet (1 tablet Oral Given 08/24/24 1427)  ketorolac  (TORADOL ) 15 MG/ML injection 15 mg (15 mg Intramuscular Given 08/24/24 1600)  ondansetron  (ZOFRAN -ODT) disintegrating tablet 4 mg (4 mg Oral Given 08/24/24 1601)     IMPRESSION / MDM / ASSESSMENT AND PLAN / ED COURSE  I reviewed the triage vital signs and the nursing notes.  33 year old female presents for evaluation of right sided flank pain.  Vital signs are stable patient is quite uncomfortable on exam.  Differential diagnosis includes, but is not limited to, UTI, muscle strain, pyelonephritis, nephrolithiasis, appendicitis, gastroenteritis.  Patient's presentation is most consistent with acute complicated illness / injury requiring diagnostic workup.  BMP and CBC are both unremarkable.  Urinalysis shows small amount of hemoglobin with greater than 50 RBCs and WBCs but no nitrites, leukocytes or bacteria.  Based on the presence of blood in the urine I am most concerned about a kidney stone.  Will obtain CT renal stone for further evaluation.  Patient was very uncomfortable on exam we will treat her pain with Percocet.   Clinical Course as of 08/24/24 1654  Sat Aug 24, 2024  1530 CT Renal Stone Study Negative, do not see a stone or evidence of pyelonephritis.   Suspect that patient may have passed the stone or that the stone is too small to see and clinical history is very consistent with ureterolithiasis. [LD]  1552 Patient reassessed and she has not had much improvement in her pain, will treat with toradol . Also feeling nauseous now so will give some zofran . [LD]  1648 Patient reports she feels a little bit better after the medications and is ready to go home.  [LD]    Clinical Course User Index [LD] Cleaster Tinnie LABOR, PA-C     FINAL CLINICAL IMPRESSION(S) / ED DIAGNOSES   Final diagnoses:  Right flank pain     Rx / DC Orders   ED Discharge Orders          Ordered    oxyCODONE -acetaminophen  (PERCOCET) 5-325 MG tablet  Every 6 hours PRN        08/24/24 1654             Note:  This document was prepared using Dragon voice recognition software and may include unintentional dictation errors.   Cleaster Tinnie LABOR, PA-C 08/24/24 1654    Floy Roberts, MD 08/24/24 [phone]

## 2024-08-24 NOTE — ED Notes (Signed)
 Pt states pain unchanged. Partner at bedside.

## 2024-08-24 NOTE — ED Notes (Signed)
 Pt in CT.

## 2024-08-24 NOTE — ED Notes (Signed)
 Gave cool rag and turned down light in room.

## 2024-09-13 ENCOUNTER — Telehealth: Admitting: Physician Assistant

## 2024-09-13 DIAGNOSIS — J4531 Mild persistent asthma with (acute) exacerbation: Secondary | ICD-10-CM | POA: Diagnosis not present

## 2024-09-13 DIAGNOSIS — B9689 Other specified bacterial agents as the cause of diseases classified elsewhere: Secondary | ICD-10-CM

## 2024-09-13 DIAGNOSIS — J019 Acute sinusitis, unspecified: Secondary | ICD-10-CM | POA: Diagnosis not present

## 2024-09-13 MED ORDER — PROMETHAZINE-DM 6.25-15 MG/5ML PO SYRP: 5.0000 mL | ORAL_SOLUTION | Freq: Four times a day (QID) | ORAL | 0 refills | Status: DC | PRN

## 2024-09-13 MED ORDER — AMOXICILLIN-POT CLAVULANATE 875-125 MG PO TABS: 1.0000 | ORAL_TABLET | Freq: Two times a day (BID) | ORAL | 0 refills | Status: DC

## 2024-09-13 MED ORDER — PREDNISONE 20 MG PO TABS: 40.0000 mg | ORAL_TABLET | Freq: Every day | ORAL | 0 refills | Status: DC

## 2024-09-13 NOTE — Patient Instructions (Signed)
 Ginger CHRISTELLA Shallow, thank you for joining Delon CHRISTELLA Dickinson, PA-C for today's virtual visit.  While this provider is not your primary care provider (PCP), if your PCP is located in our provider database this encounter information will be shared with them immediately following your visit.   A [place] MyChart account gives you access to today's visit and all your visits, tests, and labs performed at [facility]  click here if you don't have a [facility] MyChart account or go to [email or web address]  Consent: (Patient) Katherine Ruiz provided verbal consent for this virtual visit at the beginning of the encounter.  Current Medications:  Current Outpatient Medications:    amoxicillin-clavulanate (AUGMENTIN) 875-125 MG tablet, Take 1 tablet by mouth 2 (two) times daily., Disp: 14 tablet, Rfl: 0   predniSONE  (DELTASONE ) 20 MG tablet, Take 2 tablets (40 mg total) by mouth daily with breakfast., Disp: 10 tablet, Rfl: 0   promethazine-dextromethorphan (PROMETHAZINE-DM) 6.25-15 MG/5ML syrup, Take 5 mLs by mouth 4 (four) times daily as needed., Disp: 118 mL, Rfl: 0   albuterol  (VENTOLIN  HFA) 108 (90 Base) MCG/ACT inhaler, Inhale 2 puffs into the lungs every 6 (six) hours as needed for shortness of breath., Disp: 1 each, Rfl: 0   Budesonide  (PULMICORT  FLEXHALER) 90 MCG/ACT inhaler, Inhale 1-2 puffs into the lungs 2 (two) times daily. For asthma, Disp: 1 each, Rfl: 0   busPIRone  (BUSPAR ) 10 MG tablet, TAKE 1 TABLET BY MOUTH 2 TIMES DAILY FOR ANXIETY, Disp: 180 tablet, Rfl: 2   cetirizine (ZYRTEC) 10 MG tablet, Take 10 mg by mouth in the morning., Disp: , Rfl:    DULoxetine  (CYMBALTA ) 30 MG capsule, TAKE 1 CAPSULE (30 MG TOTAL) BY MOUTH DAILY. FOR ANXIETY AND DEPRESSION. TAKE WITH 60 MG PILL, Disp: 90 capsule, Rfl: 2   DULoxetine  (CYMBALTA ) 60 MG capsule, TAKE 1 CAPSULE BY MOUTH DAILY FOR ANXIETY AND DEPRESSION., Disp: 90 capsule, Rfl: 2   triamcinolone  ointment (KENALOG ) 0.5 %, Apply 1  Application topically 2 (two) times daily., Disp: 30 g, Rfl: 0   Vibegron  (GEMTESA ) 75 MG TABS, Take 1 tablet (75 mg total) by mouth daily., Disp: , Rfl:    Medications ordered in this encounter:  Meds ordered this encounter  Medications   amoxicillin-clavulanate (AUGMENTIN) 875-125 MG tablet    Sig: Take 1 tablet by mouth 2 (two) times daily.    Dispense:  14 tablet    Refill:  0    Supervising Provider:   LAMPTEY, PHILIP O [[id number]]   promethazine-dextromethorphan (PROMETHAZINE-DM) 6.25-15 MG/5ML syrup    Sig: Take 5 mLs by mouth 4 (four) times daily as needed.    Dispense:  118 mL    Refill:  0    Supervising Provider:   BLAISE ALEENE KIDD [[id number]]   predniSONE  (DELTASONE ) 20 MG tablet    Sig: Take 2 tablets (40 mg total) by mouth daily with breakfast.    Dispense:  10 tablet    Refill:  0    Supervising Provider:   BLAISE ALEENE KIDD [[id number]]     *If you need refills on other medications prior to your next appointment, please contact your pharmacy*  Follow-Up: Call back or seek an in-person evaluation if the symptoms worsen or if the condition fails to improve as anticipated.  [facility] [phone]  Other Instructions Sinus Infection, Adult A sinus infection, also called sinusitis, is inflammation of your sinuses. Sinuses are hollow spaces in the bones around your face. Your sinuses are  located: Around your eyes. In the middle of your forehead. Behind your nose. In your cheekbones. Mucus normally drains out of your sinuses. When your nasal tissues become inflamed or swollen, mucus can become trapped or blocked. This allows bacteria, viruses, and fungi to grow, which leads to infection. Most infections of the sinuses are caused by a virus. A sinus infection can develop quickly. It can last for up to 4 weeks (acute) or for more than 12 weeks (chronic). A sinus infection often develops after a cold. What are the causes? This condition is caused by anything  that creates swelling in the sinuses or stops mucus from draining. This includes: Allergies. Asthma. Infection from bacteria or viruses. Deformities or blockages in your nose or sinuses. Abnormal growths in the nose (nasal polyps). Pollutants, such as chemicals or irritants in the air. Infection from fungi. This is rare. What increases the risk? You are more likely to develop this condition if you: Have a weak body defense system (immune system). Do a lot of swimming or diving. Overuse nasal sprays. Smoke. What are the signs or symptoms? The main symptoms of this condition are pain and a feeling of pressure around the affected sinuses. Other symptoms include: Stuffy nose or congestion that makes it difficult to breathe through your nose. Thick yellow or greenish drainage from your nose. Tenderness, swelling, and warmth over the affected sinuses. A cough that may get worse at night. Decreased sense of smell and taste. Extra mucus that collects in the throat or the back of the nose (postnasal drip) causing a sore throat or bad breath. Tiredness (fatigue). Fever. How is this diagnosed? This condition is diagnosed based on: Your symptoms. Your medical history. A physical exam. Tests to find out if your condition is acute or chronic. This may include: Checking your nose for nasal polyps. Viewing your sinuses using a device that has a light (endoscope). Testing for allergies or bacteria. Imaging tests, such as an MRI or CT scan. In rare cases, a bone biopsy may be done to rule out more serious types of fungal sinus disease. How is this treated? Treatment for a sinus infection depends on the cause and whether your condition is chronic or acute. If caused by a virus, your symptoms should go away on their own within 10 days. You may be given medicines to relieve symptoms. They include: Medicines that shrink swollen nasal passages (decongestants). A spray that eases inflammation of the  nostrils (topical intranasal corticosteroids). Rinses that help get rid of thick mucus in your nose (nasal saline washes). Medicines that treat allergies (antihistamines). Over-the-counter pain relievers. If caused by bacteria, your health care provider may recommend waiting to see if your symptoms improve. Most bacterial infections will get better without antibiotic medicine. You may be given antibiotics if you have: A severe infection. A weak immune system. If caused by narrow nasal passages or nasal polyps, surgery may be needed. Follow these instructions at home: Medicines Take, use, or apply over-the-counter and prescription medicines only as told by your health care provider. These may include nasal sprays. If you were prescribed an antibiotic medicine, take it as told by your health care provider. Do not stop taking the antibiotic even if you start to feel better. Hydrate and humidify  Drink enough fluid to keep your urine pale yellow. Staying hydrated will help to thin your mucus. Use a cool mist humidifier to keep the humidity level in your home above 50%. Inhale steam for 10-15 minutes, 3-4 times  a day, or as told by your health care provider. You can do this in the bathroom while a hot shower is running. Limit your exposure to cool or dry air. Rest Rest as much as possible. Sleep with your head raised (elevated). Make sure you get enough sleep each night. General instructions  Apply a warm, moist washcloth to your face 3-4 times a day or as told by your health care provider. This will help with discomfort. Use nasal saline washes as often as told by your health care provider. Wash your hands often with soap and water to reduce your exposure to germs. If soap and water are not available, use hand sanitizer. Do not smoke. Avoid being around people who are smoking (secondhand smoke). Keep all follow-up visits. This is important. Contact a health care provider if: You have a  fever. Your symptoms get worse. Your symptoms do not improve within 10 days. Get help right away if: You have a severe headache. You have persistent vomiting. You have severe pain or swelling around your face or eyes. You have vision problems. You develop confusion. Your neck is stiff. You have trouble breathing. These symptoms may be an emergency. Get help right away. Call 911. Do not wait to see if the symptoms will go away. Do not drive yourself to the hospital. Summary A sinus infection is soreness and inflammation of your sinuses. Sinuses are hollow spaces in the bones around your face. This condition is caused by nasal tissues that become inflamed or swollen. The swelling traps or blocks the flow of mucus. This allows bacteria, viruses, and fungi to grow, which leads to infection. If you were prescribed an antibiotic medicine, take it as told by your health care provider. Do not stop taking the antibiotic even if you start to feel better. Keep all follow-up visits. This is important. This information is not intended to replace advice given to you by your health care provider. Make sure you discuss any questions you have with your health care provider. Document Revised: 10/05/2021 Document Reviewed: 10/05/2021 Elsevier Patient Education  2024 Elsevier Inc.   If you have been instructed to have an in-person evaluation today at a local Urgent Care facility, please use the link below. It will take you to a list of all of our available [facility], including address, phone number and hours of operation. Please do not delay care.  [facility]  If you or a family member do not have a primary care provider, use the link below to schedule a visit and establish care. When you choose a [facility] primary care physician or advanced practice provider, you gain a long-term partner in health. Find a Primary Care Provider  Learn more about [facility] in-office and  virtual care options:  - Get Care Now

## 2024-09-13 NOTE — Progress Notes (Signed)
 Virtual Visit Consent   Katherine Ruiz, you are scheduled for a virtual visit with a [facility] provider today. Just as with appointments in the office, your consent must be obtained to participate. Your consent will be active for this visit and any virtual visit you may have with one of our providers in the next 365 days. If you have a MyChart account, a copy of this consent can be sent to you electronically.  As this is a virtual visit, video technology does not allow for your provider to perform a traditional examination. This may limit your provider's ability to fully assess your condition. If your provider identifies any concerns that need to be evaluated in person or the need to arrange testing (such as labs, EKG, etc.), we will make arrangements to do so. Although advances in technology are sophisticated, we cannot ensure that it will always work on either your end or our end. If the connection with a video visit is poor, the visit may have to be switched to a telephone visit. With either a video or telephone visit, we are not always able to ensure that we have a secure connection.  By engaging in this virtual visit, you consent to the provision of healthcare and authorize for your insurance to be billed (if applicable) for the services provided during this visit. Depending on your insurance coverage, you may receive a charge related to this service.  I need to obtain your verbal consent now. Are you willing to proceed with your visit today? Katherine Ruiz has provided verbal consent on 09/13/2024 for a virtual visit (video or telephone). Delon CHRISTELLA Dickinson, PA-C  Date: 09/13/2024 8:07 AM   Virtual Visit via Video Note   I, Delon CHRISTELLA Dickinson, connected with  Katherine Ruiz  ([id number], 1991/09/23) on 09/13/24 at  8:00 AM EDT by a video-enabled telemedicine application and verified that I am speaking with the correct person using two identifiers.  Location: Patient: Virtual Visit  Location Patient: Home Provider: Virtual Visit Location Provider: Home Office   I discussed the limitations of evaluation and management by telemedicine and the availability of in person appointments. The patient expressed understanding and agreed to proceed.    History of Present Illness: Katherine Ruiz is a 33 y.o. who identifies as a female who was assigned female at birth, and is being seen today for sinus congestion and worsening asthma.  HPI: URI  This is a new problem. The current episode started 1 to 4 weeks ago. The problem has been gradually worsening. Associated symptoms include chest pain (tightness not pain), congestion, coughing, headaches, a plugged ear sensation, rhinorrhea (and post nasal drainage), sinus pain, a sore throat (initial, now resolved) and wheezing. Pertinent negatives include no diarrhea, ear pain, nausea or vomiting.    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   Asthma 08/15/2018    Allergies:  Allergies  Allergen Reactions   Vancomycin  Swelling    Face and throat   Lamotrigine Hives   Septra [Sulfamethoxazole-Trimethoprim] Rash   Medications:  Current Outpatient Medications:    amoxicillin-clavulanate (AUGMENTIN) 875-125 MG tablet, Take 1 tablet by mouth 2 (two) times Ruiz., Disp: 14  tablet, Rfl: 0   predniSONE  (DELTASONE ) 20 MG tablet, Take 2 tablets (40 mg total) by mouth Ruiz with breakfast., Disp: 10 tablet, Rfl: 0   promethazine-dextromethorphan (PROMETHAZINE-DM) 6.25-15 MG/5ML syrup, Take 5 mLs by mouth 4 (four) times Ruiz as needed., Disp: 118 mL, Rfl: 0   albuterol  (VENTOLIN  HFA) 108 (90 Base) MCG/ACT  inhaler, Inhale 2 puffs into the lungs every 6 (six) hours as needed for shortness of breath., Disp: 1 each, Rfl: 0   Budesonide  (PULMICORT  FLEXHALER) 90 MCG/ACT inhaler, Inhale 1-2 puffs into the lungs 2 (two) times Ruiz. For asthma, Disp: 1 each, Rfl: 0   busPIRone  (BUSPAR ) 10 MG tablet, TAKE 1 TABLET BY MOUTH 2 TIMES Ruiz FOR ANXIETY, Disp: 180 tablet, Rfl: 2   cetirizine (ZYRTEC) 10 MG tablet, Take 10 mg by mouth in the morning., Disp: , Rfl:    DULoxetine  (CYMBALTA ) 30 MG capsule, TAKE 1 CAPSULE (30 MG TOTAL) BY MOUTH Ruiz. FOR ANXIETY AND DEPRESSION. TAKE WITH 60 MG PILL, Disp: 90 capsule, Rfl: 2   DULoxetine  (CYMBALTA ) 60 MG capsule, TAKE 1 CAPSULE BY MOUTH Ruiz FOR ANXIETY AND DEPRESSION., Disp: 90 capsule, Rfl: 2   triamcinolone  ointment (KENALOG ) 0.5 %, Apply 1 Application topically 2 (two) times Ruiz., Disp: 30 g, Rfl: 0   Vibegron  (GEMTESA ) 75 MG TABS, Take 1 tablet (75 mg total) by mouth Ruiz., Disp: , Rfl:   Observations/Objective: Patient is well-developed, well-nourished in no acute distress.  Resting comfortably at home.  Head is normocephalic, atraumatic.  No labored breathing. Speech is clear and coherent with logical content.  Patient is alert and oriented at baseline.    Assessment and Plan: 1. Acute bacterial sinusitis (Primary) - amoxicillin-clavulanate (AUGMENTIN) 875-125 MG tablet; Take 1 tablet by mouth 2 (two) times Ruiz.  Dispense: 14 tablet; Refill: 0 - promethazine-dextromethorphan (PROMETHAZINE-DM) 6.25-15 MG/5ML syrup; Take 5 mLs by mouth 4 (four) times Ruiz as needed.  Dispense: 118 mL; Refill: 0  2. Mild persistent asthma with exacerbation - promethazine-dextromethorphan (PROMETHAZINE-DM) 6.25-15 MG/5ML syrup; Take 5 mLs by mouth 4 (four) times Ruiz as needed.  Dispense: 118 mL; Refill: 0 - predniSONE  (DELTASONE ) 20 MG tablet; Take 2 tablets (40 mg total) by mouth Ruiz with breakfast.  Dispense: 10 tablet; Refill: 0  - Worsening over a week  despite OTC medications - Will treat with Augmentin - Promethazine DM for nighttime cough - Prednisone  burst added - Continue albuterol  inhaler as needed - Can continue Mucinex  (PLAIN) during the day - Push fluids.  - Rest.  - Steam and humidifier can help - Seek in person evaluation if worsening or symptoms fail to improve    Follow Up Instructions: I discussed the assessment and treatment plan with the patient. The patient was provided an opportunity to ask questions and all were answered. The patient agreed with the plan and demonstrated an understanding of the instructions.  A copy of instructions were sent to the patient via MyChart unless otherwise noted below.    The patient was advised to call back or seek an in-person evaluation if the symptoms worsen or if the condition fails to improve as anticipated.    Delon CHRISTELLA Dickinson, PA-C

## 2024-09-16 ENCOUNTER — Telehealth: Payer: Self-pay | Admitting: Primary Care

## 2024-09-16 ENCOUNTER — Ambulatory Visit (INDEPENDENT_AMBULATORY_CARE_PROVIDER_SITE_OTHER)
Admission: RE | Admit: 2024-09-16 | Discharge: 2024-09-16 | Disposition: A | Discharge status: Home / Self Care | Source: Ambulatory Visit | Attending: Nurse Practitioner | Admitting: Nurse Practitioner

## 2024-09-16 ENCOUNTER — Ambulatory Visit: Admitting: Nurse Practitioner

## 2024-09-16 VITALS — BP 106/70 | HR 61 | Temp 98.1°F | Wt 212.6 lb

## 2024-09-16 DIAGNOSIS — R051 Acute cough: Secondary | ICD-10-CM | POA: Diagnosis not present

## 2024-09-16 DIAGNOSIS — J4531 Mild persistent asthma with (acute) exacerbation: Secondary | ICD-10-CM

## 2024-09-16 DIAGNOSIS — R042 Hemoptysis: Secondary | ICD-10-CM

## 2024-09-16 DIAGNOSIS — R509 Fever, unspecified: Secondary | ICD-10-CM

## 2024-09-16 DIAGNOSIS — B379 Candidiasis, unspecified: Secondary | ICD-10-CM

## 2024-09-16 DIAGNOSIS — B9689 Other specified bacterial agents as the cause of diseases classified elsewhere: Secondary | ICD-10-CM

## 2024-09-16 DIAGNOSIS — T3695XA Adverse effect of unspecified systemic antibiotic, initial encounter: Secondary | ICD-10-CM

## 2024-09-16 DIAGNOSIS — J019 Acute sinusitis, unspecified: Secondary | ICD-10-CM

## 2024-09-16 LAB — POC COVID19 BINAXNOW: SARS Coronavirus 2 Ag: NEGATIVE

## 2024-09-16 MED ORDER — PREDNISONE 20 MG PO TABS: 20.0000 mg | ORAL_TABLET | Freq: Every day | ORAL | 0 refills | Status: DC

## 2024-09-16 MED ORDER — FLUCONAZOLE 150 MG PO TABS: 150.0000 mg | ORAL_TABLET | Freq: Once | ORAL | 0 refills | Status: AC

## 2024-09-16 MED ORDER — GUAIFENESIN-CODEINE 100-10 MG/5ML PO SOLN: 5.0000 mL | Freq: Three times a day (TID) | ORAL | 0 refills | Status: DC | PRN

## 2024-09-16 MED ORDER — PROMETHAZINE-DM 6.25-15 MG/5ML PO SYRP: 5.0000 mL | ORAL_SOLUTION | Freq: Four times a day (QID) | ORAL | 0 refills | Status: DC | PRN

## 2024-09-16 NOTE — Progress Notes (Signed)
 Established Patient Office Visit  Subjective   Patient ID: Katherine Ruiz, female    DOB: [birth year]  Age: 33 y.o. MRN: [id number]  Chief Complaint  Patient presents with   Pneumonia    Pt complains of having sinus and asthma issues for over a week. States fever came back yesterday. Complains of coughing up blood. She still has some antibiotics left. Complains of chest pain. Feels like someone is sitting on my chest    Discussed the use of AI scribe software for clinical note transcription with the patient, who gave verbal consent to proceed.  History of Present Illness Katherine Ruiz is a 33 year old female with asthma who presents with persistent respiratory symptoms and fever.  Her symptoms began over a week ago, initially resembling a common cold similar to what her children experienced. She developed a fever of 101F incidentally discovered while at work. She works at an [employer] and is frequently exposed to illnesses.  By Tuesday night into Wednesday morning, her symptoms progressed to severe congestion, headache, body aches, and mild chest discomfort. She was unable to get out of bed due to the severity of her symptoms, prompting her to seek a virtual appointment. She was diagnosed with a sinus infection and prescribed Augmentin, cough medicine, and prednisone .  She has been taking the prescribed medications, with two more days remaining on prednisone  and a few days left on Augmentin. She has finished her cough medicine. Prednisone  is effective but causes mood changes, making her feel 'angry'.  She has asthma and uses a steroid inhaler daily. She has been using her albuterol  inhaler more frequently than usual, especially when not on cough medicine, which she states has been very effective in managing her symptoms. She does not have a nebulizer.  Her current symptoms include a heavy feeling in her chest, productive cough with green and occasionally bloody sputum, and a  fever that returned yesterday at 100.64F. She feels weak and tired, but her throat is not sore. Her left ear feels clogged, but there is no pain. Facial pain and pressure have improved with antibiotics.  She has received her flu vaccine this season and completed the original COVID-19 vaccine series. She has been taking over-the-counter medications like Tylenol  Cold and Flu, DayQuil, and NyQuil to manage her symptoms.      Review of Systems  Constitutional:  Positive for fever and malaise/fatigue.  HENT:  Positive for ear pain and sinus pain.   Respiratory:  Positive for cough, hemoptysis, sputum production and shortness of breath.   Cardiovascular:  Negative for chest pain.  Gastrointestinal:  Negative for abdominal pain, diarrhea, nausea and vomiting.  Musculoskeletal:  Negative for myalgias.  Neurological:  Positive for headaches.      Objective:     BP 106/70   Pulse 61   Temp 98.1 F (36.7 C) (Oral)   Wt 212 lb 9.6 oz (96.4 kg)   LMP 09/13/2021 (Approximate) Comment: Tubal Ligation  SpO2 99%   BMI 40.17 kg/m  BP Readings from Last 3 Encounters:  09/16/24 106/70  08/24/24 111/63  04/30/24 118/76   Wt Readings from Last 3 Encounters:  09/16/24 212 lb 9.6 oz (96.4 kg)  08/24/24 200 lb (90.7 kg)  04/30/24 211 lb (95.7 kg)   SpO2 Readings from Last 3 Encounters:  09/16/24 99%  08/24/24 96%  03/26/24 98%      Physical Exam Vitals and nursing note reviewed.  Constitutional:      Appearance: Normal  appearance.  HENT:     Right Ear: Tympanic membrane, ear canal and external ear normal.     Left Ear: Tympanic membrane, ear canal and external ear normal.     Mouth/Throat:     Mouth: Mucous membranes are moist.     Pharynx: Oropharynx is clear.  Cardiovascular:     Rate and Rhythm: Normal rate and regular rhythm.     Heart sounds: Normal heart sounds.  Pulmonary:     Effort: Pulmonary effort is normal.     Breath sounds: Wheezing present.  Lymphadenopathy:      Cervical: No cervical adenopathy.  Neurological:     Mental Status: She is alert.      Results for orders placed or performed in visit on 09/16/24  POC COVID-19 BinaxNow  Result Value Ref Range   SARS Coronavirus 2 Ag Negative Negative      The ASCVD Risk score (Arnett DK, et al., 2019) failed to calculate for the following reasons:   The 2019 ASCVD risk score is only valid for ages 56 to 72    Assessment & Plan:   Problem List Items Addressed This Visit   None Visit Diagnoses       Acute cough    -  Primary   Relevant Orders   DG Chest 2 View   POC COVID-19 BinaxNow (Completed)     Fever, unspecified fever cause       Relevant Orders   POC COVID-19 BinaxNow (Completed)     Hemoptysis       Relevant Orders   DG Chest 2 View     Mild persistent asthma with exacerbation       Relevant Medications   promethazine-dextromethorphan (PROMETHAZINE-DM) 6.25-15 MG/5ML syrup   predniSONE  (DELTASONE ) 20 MG tablet   Other Relevant Orders   DG Chest 2 View     Acute bacterial sinusitis       Relevant Medications   promethazine-dextromethorphan (PROMETHAZINE-DM) 6.25-15 MG/5ML syrup   predniSONE  (DELTASONE ) 20 MG tablet   fluconazole  (DIFLUCAN ) 150 MG tablet     Antibiotic-induced yeast infection       Relevant Medications   fluconazole  (DIFLUCAN ) 150 MG tablet     Assessment and Plan Assessment & Plan Acute lower respiratory tract infection with asthma exacerbation Persistent cough with green and bloody sputum, chest heaviness, wheezing, and shortness of breath. Effective response to albuterol  and prednisone , but mood changes noted. Concern for atypical infection due to recurrent fever and persistent symptoms despite Augmentin. - Order chest x-ray to evaluate for pneumonia or complications. - Continue Augmentin. - Prescribe azithromycin or doxycycline  for atypical pathogens, pending chest xray  - Continue prednisone  with tapering schedule. - Advise daily Pulmicort  inhaler  and albuterol  as needed, cautioning against excessive use.  Acute sinusitis Persistent ear fullness, especially in the left ear, with some relief of facial pain and pressure from Augmentin. - Continue Augmentin for sinusitis.   Return if symptoms worsen or fail to improve.    Adina Crandall, NP

## 2024-09-16 NOTE — Telephone Encounter (Addendum)
 Called pharmacy to cancel promethazine.  Called patient to inform her of codeine  sent into pharmacy No questions or concerns.

## 2024-09-16 NOTE — Telephone Encounter (Signed)
 I sent in a codeine  cough medication.  She can take this up to 3 times a day.  This can cause sedation so use caution.  Please call and cancel the promethazine cough medication prescription

## 2024-09-16 NOTE — Telephone Encounter (Signed)
 Copied from CRM #[id number]. Topic: Clinical - Prescription Issue >> Sep 16, 2024  3:58 PM Nessti S wrote: Reason for CRM: pt called because insurance wont pay for promethazine-dextromethorphan (PROMETHAZINE-DM) 6.25-15 MG/5ML syrup; pt would like something else to get her through the night. Call back number [phone]

## 2024-09-16 NOTE — Addendum Note (Signed)
 Addended by: WENDEE LYNWOOD HERO on: 09/16/2024 04:21 PM   Modules accepted: Orders

## 2024-09-16 NOTE — Patient Instructions (Signed)
 Nice to see you today  I have extended the steroids Covid test was negative I have renewed the cough syrup and will be in touch with the xray once I have reviewed it

## 2024-09-17 ENCOUNTER — Ambulatory Visit: Payer: Self-pay | Admitting: Nurse Practitioner

## 2024-09-23 ENCOUNTER — Encounter: Payer: Self-pay | Admitting: Physician Assistant

## 2024-09-23 ENCOUNTER — Ambulatory Visit (INDEPENDENT_AMBULATORY_CARE_PROVIDER_SITE_OTHER): Admitting: Physician Assistant

## 2024-09-23 VITALS — BP 115/79 | HR 78 | Ht 61.0 in | Wt 200.0 lb

## 2024-09-23 DIAGNOSIS — N3946 Mixed incontinence: Secondary | ICD-10-CM | POA: Diagnosis not present

## 2024-09-23 LAB — MICROSCOPIC EXAMINATION

## 2024-09-23 LAB — URINALYSIS, COMPLETE
Bilirubin, UA: NEGATIVE
Glucose, UA: NEGATIVE
Ketones, UA: NEGATIVE
Leukocytes,UA: NEGATIVE
Nitrite, UA: NEGATIVE
Protein,UA: NEGATIVE
RBC, UA: NEGATIVE
Specific Gravity, UA: 1.015 (ref 1.005–1.030)
Urobilinogen, Ur: 0.2 mg/dL (ref 0.2–1.0)
pH, UA: 7 (ref 5.0–7.5)

## 2024-09-23 NOTE — Progress Notes (Signed)
 09/23/2024 3:40 PM   Katherine Ruiz [birth year] [id number]  CC: Chief Complaint  Patient presents with   Mixed incontinence   HPI: Katherine Ruiz is a 33 y.o. female with PMH OAB wet with mixed urge and stress incontinence who presents today for follow-up.   She saw Dr. Gaston most recently on 08/14/2023.  She had completed urodynamics.  She was primarily bothered by her stress symptoms, so he was considering a sling and bulking agent.  He scheduled her for 6-week follow-up on Gemtesa  and referred her to pelvic floor PT, but she was lost to follow-up.  Today she reports she remains on Gemtesa , though she feels it is not helping her much.  She has been doing Kegel exercises on her own.  She is still primarily bothered by her stress incontinence, which is rather significant and has been worse over the past year.  She wears 3-4 pads daily, and they can be wet.  She is using multiple layers of absorbent products and moisture barriers in the car and at home due to sometimes large volume leaks.  She is incredibly bothered by this and would like to pursue surgery.  In-office UA and microscopy pan negative.  PMH: Past Medical History:  Diagnosis Date   Anxiety    Arthritis    Asthma    Asthma exacerbation 08/01/2023   Cervical radiculopathy 09/01/2018   Chest pain 06/01/2022   Chickenpox    COVID-19 virus infection 10/24/2022   Depression    Elevated LFTs 11/30/2022   Family history of breast cancer    4/21 cancer genetic testing letter sent   Hematuria 11/17/2022   History of kidney stones    History of MRSA infection 10/17/2014   Formatting of this note might be different from the original. Hospitalized at [facility] 08/2014. I+D, IV antibiotics   History of UTI    MRSA (methicillin resistant staph aureus) culture positive 2004   spider bite   Muscle spasm 03/29/2022   Neck pain 03/29/2022   Peripheral edema 11/17/2022   Pyelonephritis 11/17/2022   S/P  endometrial ablation 09/15/2021   S/P tubal ligation 09/15/2021    Surgical History: Past Surgical History:  Procedure Laterality Date   DILATATION AND CURETTAGE/HYSTEROSCOPY WITH MINERVA N/A 09/02/2021   Procedure: DILATATION AND CURETTAGE/HYSTEROSCOPY WITH MINERVA;  Surgeon: Arloa Lamar SQUIBB, MD;  Location: [facility] ORS;  Service: Gynecology;  Laterality: N/A;   FOOT SURGERY Left 11/14/2012   screw in foot   LAPAROSCOPIC BILATERAL SALPINGECTOMY Bilateral 09/02/2021   Procedure: LAPAROSCOPIC BILATERAL SALPINGECTOMY;  Surgeon: Arloa Lamar SQUIBB, MD;  Location: [facility] ORS;  Service: Gynecology;  Laterality: Bilateral;   TUBAL LIGATION     WOUND DEBRIDEMENT Right 12/2012   inner thigh with drain ( spider bite MRSA)    Home Medications:  Allergies as of 09/23/2024       Reactions   Vancomycin  Swelling   Face and throat   Lamotrigine Hives   Septra [sulfamethoxazole-trimethoprim] Rash        Medication List        Accurate as of September 23, 2024  3:40 PM. If you have any questions, ask your nurse or doctor.          albuterol  108 (90 Base) MCG/ACT inhaler Commonly known as: VENTOLIN  HFA Inhale 2 puffs into the lungs every 6 (six) hours as needed for shortness of breath.   amoxicillin-clavulanate 875-125 MG tablet Commonly known as: AUGMENTIN Take 1 tablet by mouth 2 (two) times daily.  busPIRone  10 MG tablet Commonly known as: BUSPAR  TAKE 1 TABLET BY MOUTH 2 TIMES DAILY FOR ANXIETY   cetirizine 10 MG tablet Commonly known as: ZYRTEC Take 10 mg by mouth in the morning.   DULoxetine  60 MG capsule Commonly known as: CYMBALTA  TAKE 1 CAPSULE BY MOUTH DAILY FOR ANXIETY AND DEPRESSION.   DULoxetine  30 MG capsule Commonly known as: CYMBALTA  TAKE 1 CAPSULE (30 MG TOTAL) BY MOUTH DAILY. FOR ANXIETY AND DEPRESSION. TAKE WITH 60 MG PILL   fluconazole  150 MG tablet Commonly known as: DIFLUCAN  Take 150 mg by mouth once.   Gemtesa  75 MG Tabs Generic drug: Vibegron  Take 1  tablet (75 mg total) by mouth daily.   guaiFENesin -codeine  100-10 MG/5ML syrup Take 5 mLs by mouth 3 (three) times daily as needed.   predniSONE  20 MG tablet Commonly known as: DELTASONE  Take 1 tablet (20 mg total) by mouth daily with breakfast.   promethazine-dextromethorphan 6.25-15 MG/5ML syrup Commonly known as: PROMETHAZINE-DM Take 5 mLs by mouth 4 (four) times daily as needed.   Pulmicort  Flexhaler 90 MCG/ACT inhaler Generic drug: Budesonide  Inhale 1-2 puffs into the lungs 2 (two) times daily. For asthma   triamcinolone  ointment 0.5 % Commonly known as: KENALOG  Apply 1 Application topically 2 (two) times daily.        Allergies:  Allergies  Allergen Reactions   Vancomycin  Swelling    Face and throat   Lamotrigine Hives   Septra [Sulfamethoxazole-Trimethoprim] Rash    Family History: Family History  Problem Relation Age of Onset   Arthritis Father    Hyperlipidemia Father    Hypertension Father    Mental illness Father    Diabetes Father    Hyperlipidemia Maternal Grandmother    Arthritis Maternal Grandmother    Breast cancer Maternal Grandmother    Hypertension Maternal Grandmother    Mental illness Maternal Grandmother    Diabetes Maternal Grandmother    Cancer Maternal Grandmother 72       Breast   Cancer - Other Maternal Grandmother        bile duct   Arthritis Maternal Grandfather    Mental illness Paternal Grandmother    Hypertension Paternal Grandmother    Arthritis Paternal Grandmother    Arthritis Paternal Grandfather    Mental illness Paternal Grandfather    Hypertension Paternal Grandfather     Social History:   reports that she quit smoking about 11 years ago. Her smoking use included cigarettes. She has been exposed to tobacco smoke. She has never used smokeless tobacco. She reports current alcohol use. She reports current drug use. Drug: Marijuana.  Physical Exam: BP 115/79   Pulse 78   Ht 5' 1 (1.549 m)   Wt 200 lb (90.7 kg)    LMP 09/13/2021 (Approximate) Comment: Tubal Ligation  SpO2 97%   BMI 37.79 kg/m   Constitutional:  Alert and oriented, no acute distress, nontoxic appearing HEENT: [place], AT Cardiovascular: No clubbing, cyanosis, or edema Respiratory: Normal respiratory effort, no increased work of breathing Skin: No rashes, bruises or suspicious lesions Neurologic: Grossly intact, no focal deficits, moving all 4 extremities Psychiatric: Normal mood and affect  Laboratory Data: Results for orders placed or performed in visit on 09/23/24  Microscopic Examination   Collection Time: 09/23/24  3:25 PM   Urine  Result Value Ref Range   WBC, UA 0-5 0 - 5 /hpf   RBC, Urine 0-2 0 - 2 /hpf   Epithelial Cells (non renal) 0-10 0 - 10 /hpf  Bacteria, UA Few None seen/Few  Urinalysis, Complete   Collection Time: 09/23/24  3:25 PM  Result Value Ref Range   Specific Gravity, UA 1.015 1.005 - 1.030   pH, UA 7.0 5.0 - 7.5   Color, UA Yellow Yellow   Appearance Ur Clear Clear   Leukocytes,UA Negative Negative   Protein,UA Negative Negative/Trace   Glucose, UA Negative Negative   Ketones, UA Negative Negative   RBC, UA Negative Negative   Bilirubin, UA Negative Negative   Urobilinogen, Ur 0.2 0.2 - 1.0 mg/dL   Nitrite, UA Negative Negative   Microscopic Examination Comment    Microscopic Examination See below:    Assessment & Plan:   1. Mixed stress and urge urinary incontinence (Primary) Progressive and incredibly bothersome stress incontinence.  She would like to pursue procedural intervention.  Will schedule her with Dr. MacDiarmid.  UA bland, low suspicion for UTI today. - Urinalysis, Complete  Return in about 1 week (around 09/30/2024) for Incontinence f/u with Dr. Gaston.  Lucie Hones, PA-C  [facility] [place] [address] [place], [place] [address] [phone]

## 2024-09-30 ENCOUNTER — Ambulatory Visit: Admitting: Urology

## 2024-09-30 VITALS — BP 126/79 | HR 81 | Ht 61.0 in | Wt 200.0 lb

## 2024-09-30 DIAGNOSIS — N3946 Mixed incontinence: Secondary | ICD-10-CM

## 2024-09-30 LAB — URINALYSIS, COMPLETE
Bilirubin, UA: NEGATIVE
Glucose, UA: NEGATIVE
Ketones, UA: NEGATIVE
Leukocytes,UA: NEGATIVE
Nitrite, UA: NEGATIVE
Protein,UA: NEGATIVE
RBC, UA: NEGATIVE
Specific Gravity, UA: 1.02 (ref 1.005–1.030)
Urobilinogen, Ur: 0.2 mg/dL (ref 0.2–1.0)
pH, UA: 6 (ref 5.0–7.5)

## 2024-09-30 LAB — MICROSCOPIC EXAMINATION: Bacteria, UA: NONE SEEN

## 2024-09-30 MED ORDER — MIRABEGRON ER 50 MG PO TB24: 50.0000 mg | ORAL_TABLET | Freq: Every day | ORAL | 11 refills | Status: AC

## 2024-09-30 NOTE — Progress Notes (Signed)
 09/30/2024 10:04 AM   Katherine Ruiz [birth year] [id number]  Referring provider: Gretta Comer POUR, NP [address] [place],  [place] [address]  No chief complaint on file.   HPI: I was consulted to assess the patient's urinary incontinence.  She has urge incontinence with almost no warning.  She can leak with coughing sneezing bending lifting.  She is small-volume bedwetting.  She can soak 6 pads a day.  She said both are significant.   She voids every 2-3 hours gets up 2-3 times a night.  Flow is reasonable   She is get bladder infections.  No hysterectomy.     Grade 2 hypermobility the bladder neck. No stress incontinence with moderate cough. No prolapse    Patient has mixed incontinence.  For years she is stress incontinence and her incontinence is worsened over number years.  I'm suspect that the overactive bladder component is significant and she also small-volume bedwetting.    Patient looks like she might have a bladder infection with microscopic hematuria as well.  Call if culture positive.   Last culture was positive with 2 organisms.  Incontinence stable.  Clinically not infected  On pelvic examination she grade 2 hypermobility bladder neck and negative cough test after cystoscopy  Cystoscopy: Patient underwent flexible cystoscopy.  Bladder mucosa and trigone were normal.  No cystitis.  No carcinoma well-tolerated.  On urodynamics patient did not void and was catheterized for 200 mL.  Maximum bladder capacity was 580 mL.  Bladder was stable.  Her Valsalva leak point pressure at 400 mL was 61 cm of water with moderately severe leakage.  Valsalva leak point pressure of 500 mL was 50 cm of water with moderate leakage.  She did not generate a detrusor contraction during voluntary voiding in the lab setting.  In a private restroom she voided 400 mL and a residual was 180 mL.  Bladder neck descent approximately 1 cm.  Voiding phase was prolonged and she was straining.  I do  not think she was actually generating detrusor contractions and the increase in pressure on the detrusor pressure was subtraction artifact the same was seen during the filling phase when we were checking leak point pressures.  Having said that there may have been a few low pressure detrusor contractions early on in the voiding phase and halfway through.  She had a small cystocele noted   By history patient has mixed incontinence of both are significant. She does have moderately low leak point pressures which could explain her high-volume episodes but this may also be due to bladder overactivity not detected during the test. She also has bedwetting. He also says she gets very little warning. Role of medical and behavioral therapy discussed. If she does not reach her treatment goal a sling and a bulking agent would need to be discussed recognizing that her OAB symptom complex could persist or worsen. I would mention refractory therapies to her but if she is bothered by stress incontinence the stress procedures would likely be the best treatment path for her   Assessment 6 weeks on Gemtesa  samples and prescription.  Call if culture positive.  Physical therapy consultation given which I think is an excellent idea based on her age and severity   Today Patient saw extender November 2025.  She was very bothered by her stress component and was considering surgery.  Gemtesa  did not help her.  Patient once again reiterated she wants her incontinence fixed.  She said she  can be sitting in the car and just start leaking.  This can happen when she is standing.  There is no question she leaks with coughing sneezing and laughing.   PMH: Past Medical History:  Diagnosis Date   Anxiety    Arthritis    Asthma    Asthma exacerbation 08/01/2023   Cervical radiculopathy 09/01/2018   Chest pain 06/01/2022   Chickenpox    COVID-19 virus infection 10/24/2022   Depression    Elevated LFTs 11/30/2022   Family history  of breast cancer    4/21 cancer genetic testing letter sent   Hematuria 11/17/2022   History of kidney stones    History of MRSA infection 10/17/2014   Formatting of this note might be different from the original. Hospitalized at [facility] 08/2014. I+D, IV antibiotics   History of UTI    MRSA (methicillin resistant staph aureus) culture positive 2004   spider bite   Muscle spasm 03/29/2022   Neck pain 03/29/2022   Peripheral edema 11/17/2022   Pyelonephritis 11/17/2022   S/P endometrial ablation 09/15/2021   S/P tubal ligation 09/15/2021    Surgical History: Past Surgical History:  Procedure Laterality Date   DILATATION AND CURETTAGE/HYSTEROSCOPY WITH MINERVA N/A 09/02/2021   Procedure: DILATATION AND CURETTAGE/HYSTEROSCOPY WITH MINERVA;  Surgeon: Arloa Lamar SQUIBB, MD;  Location: [facility] ORS;  Service: Gynecology;  Laterality: N/A;   FOOT SURGERY Left 11/14/2012   screw in foot   LAPAROSCOPIC BILATERAL SALPINGECTOMY Bilateral 09/02/2021   Procedure: LAPAROSCOPIC BILATERAL SALPINGECTOMY;  Surgeon: Arloa Lamar SQUIBB, MD;  Location: [facility] ORS;  Service: Gynecology;  Laterality: Bilateral;   TUBAL LIGATION     WOUND DEBRIDEMENT Right 12/2012   inner thigh with drain ( spider bite MRSA)    Home Medications:  Allergies as of 09/30/2024       Reactions   Vancomycin  Swelling   Face and throat   Lamotrigine Hives   Septra [sulfamethoxazole-trimethoprim] Rash        Medication List        Accurate as of September 30, 2024 10:04 AM. If you have any questions, ask your nurse or doctor.          albuterol  108 (90 Base) MCG/ACT inhaler Commonly known as: VENTOLIN  HFA Inhale 2 puffs into the lungs every 6 (six) hours as needed for shortness of breath.   amoxicillin-clavulanate 875-125 MG tablet Commonly known as: AUGMENTIN Take 1 tablet by mouth 2 (two) times daily.   busPIRone  10 MG tablet Commonly known as: BUSPAR  TAKE 1 TABLET BY MOUTH 2 TIMES DAILY FOR ANXIETY    cetirizine 10 MG tablet Commonly known as: ZYRTEC Take 10 mg by mouth in the morning.   DULoxetine  60 MG capsule Commonly known as: CYMBALTA  TAKE 1 CAPSULE BY MOUTH DAILY FOR ANXIETY AND DEPRESSION.   DULoxetine  30 MG capsule Commonly known as: CYMBALTA  TAKE 1 CAPSULE (30 MG TOTAL) BY MOUTH DAILY. FOR ANXIETY AND DEPRESSION. TAKE WITH 60 MG PILL   fluconazole  150 MG tablet Commonly known as: DIFLUCAN  Take 150 mg by mouth once.   Gemtesa  75 MG Tabs Generic drug: Vibegron  Take 1 tablet (75 mg total) by mouth daily.   guaiFENesin -codeine  100-10 MG/5ML syrup Take 5 mLs by mouth 3 (three) times daily as needed.   predniSONE  20 MG tablet Commonly known as: DELTASONE  Take 1 tablet (20 mg total) by mouth daily with breakfast.   promethazine-dextromethorphan 6.25-15 MG/5ML syrup Commonly known as: PROMETHAZINE-DM Take 5 mLs by mouth 4 (four) times daily  as needed.   Pulmicort  Flexhaler 90 MCG/ACT inhaler Generic drug: Budesonide  Inhale 1-2 puffs into the lungs 2 (two) times daily. For asthma   triamcinolone  ointment 0.5 % Commonly known as: KENALOG  Apply 1 Application topically 2 (two) times daily.        Allergies:  Allergies  Allergen Reactions   Vancomycin  Swelling    Face and throat   Lamotrigine Hives   Septra [Sulfamethoxazole-Trimethoprim] Rash    Family History: Family History  Problem Relation Age of Onset   Arthritis Father    Hyperlipidemia Father    Hypertension Father    Mental illness Father    Diabetes Father    Hyperlipidemia Maternal Grandmother    Arthritis Maternal Grandmother    Breast cancer Maternal Grandmother    Hypertension Maternal Grandmother    Mental illness Maternal Grandmother    Diabetes Maternal Grandmother    Cancer Maternal Grandmother 45       Breast   Cancer - Other Maternal Grandmother        bile duct   Arthritis Maternal Grandfather    Mental illness Paternal Grandmother    Hypertension Paternal Grandmother     Arthritis Paternal Grandmother    Arthritis Paternal Grandfather    Mental illness Paternal Grandfather    Hypertension Paternal Grandfather     Social History:  reports that she quit smoking about 11 years ago. Her smoking use included cigarettes. She has been exposed to tobacco smoke. She has never used smokeless tobacco. She reports current alcohol use. She reports current drug use. Drug: Marijuana.  ROS:                                        Physical Exam: LMP 09/13/2021 (Approximate) Comment: Tubal Ligation  Constitutional:  Alert and oriented, No acute distress. HEENT: [place] AT, moist mucus membranes.  Trachea midline, no masses.   Laboratory Data: Lab Results  Component Value Date   WBC 10.1 08/24/2024   HGB 12.6 08/24/2024   HCT 37.1 08/24/2024   MCV 96.4 08/24/2024   PLT 307 08/24/2024    Lab Results  Component Value Date   CREATININE 0.92 08/24/2024    No results found for: PSA  No results found for: TESTOSTERONE  Lab Results  Component Value Date   HGBA1C 5.3 10/06/2020    Urinalysis    Component Value Date/Time   COLORURINE YELLOW (A) 08/24/2024 1343   APPEARANCEUR Clear 09/23/2024 1525   LABSPEC 1.015 08/24/2024 1343   LABSPEC 1.016 03/13/2014 1903   PHURINE 5.0 08/24/2024 1343   GLUCOSEU Negative 09/23/2024 1525   GLUCOSEU Negative 03/13/2014 1903   HGBUR SMALL (A) 08/24/2024 1343   BILIRUBINUR Negative 09/23/2024 1525   BILIRUBINUR Negative 03/13/2014 1903   KETONESUR NEGATIVE 08/24/2024 1343   PROTEINUR Negative 09/23/2024 1525   PROTEINUR NEGATIVE 08/24/2024 1343   UROBILINOGEN 0.2 03/26/2024 1532   NITRITE Negative 09/23/2024 1525   NITRITE NEGATIVE 08/24/2024 1343   LEUKOCYTESUR Negative 09/23/2024 1525   LEUKOCYTESUR NEGATIVE 08/24/2024 1343   LEUKOCYTESUR Trace 03/13/2014 1903    Pertinent Imaging:   Assessment & Plan: I spent a lot of time with the patient.  I drew a picture.  She understands that I  believe she has a significant overactive bladder and this is why her incontinence is worsening and why she has very little warning and can leak a large volume.  She  has moderately severe stress incontinence.  We talked about her family history.  I went over a bulking agent and sling with full templates discussed.  I strongly urged her to try another medication and I called in Myrbetriq 50 mg 30 x 11.  I think she is very well counseled that her overactive bladder and incontinence could persist or worsen with a sling and bulking agent.  Rare risks and sequelae with these treatment discussed in detail.  These also include retention pain and erosion and sequela from bulking agent she is hoping to be helped as soon as possible.  She would like to do them bulking agent under anesthesia in [place] and if this fails after 1 or 2 treatments she will go on to have a sling unless Myrbetriq works really well.  I do think treating the stress incontinence is a good choice over third line OAB therapies at this stage.  She has had MRSA before and sometimes is tender so I will not do the bulking agent in the office    1. Mixed stress and urge urinary incontinence (Primary)  - Urinalysis, Complete  2. Mixed incontinence  - Urinalysis, Complete   No follow-ups on file.  Glendia DELENA Elizabeth, MD  [facility] [address], [address] [place], [place] [address] [phone]

## 2024-10-03 ENCOUNTER — Other Ambulatory Visit: Payer: Self-pay | Admitting: Urology

## 2024-10-08 NOTE — Patient Instructions (Addendum)
SURGICAL WAITING ROOM VISITATION  Patients having surgery or a procedure may have no more than 2 support people in the waiting area - these visitors may rotate.    Children under the age of 64 must have an adult with them who is not the patient.  Visitors with respiratory illnesses are discouraged from visiting and should remain at home.  If the patient needs to stay at the hospital during part of their recovery, the visitor guidelines for inpatient rooms apply. Pre-op nurse will coordinate an appropriate time for 1 support person to accompany patient in pre-op.  This support person may not rotate.    Please refer to the [facility] website for the visitor guidelines for Inpatients (after your surgery is over and you are in a regular room).       Your procedure is scheduled on: 10/15/24   Report to [facility] Main Entrance    Report to admitting at 12:15 PM   Call this number if you have problems the morning of surgery [phone]   Do not eat food or drink liquids:After Midnight.but may have sips of water to take meds.     Oral Hygiene is also important to reduce your risk of infection.                                    Remember - BRUSH YOUR TEETH THE MORNING OF SURGERY WITH YOUR REGULAR TOOTHPASTE    Stop all vitamins and herbal supplements 7 days before surgery.   Take these medicines the morning of surgery with A SIP OF WATER: tylenol  if needed, duloxetine , loratadine, myrbetriq , inhaler                You may not have any metal on your body including hair pins, jewelry, and body piercing             Do not wear make-up, lotions, powders, perfumes/cologne, or deodorant  Do not wear nail polish including gel and S&S, artificial/acrylic nails, or any other type of covering on natural nails including finger and toenails. If you have artificial nails, gel coating, etc. that needs to be removed by a nail salon please have this removed prior to surgery or surgery may  need to be canceled/ delayed if the surgeon/ anesthesia feels like they are unable to be safely monitored.   Do not shave  48 hours prior to surgery.     Do not bring valuables to the hospital. [facility] IS NOT             RESPONSIBLE   FOR VALUABLES.   Contacts, glasses, dentures or bridgework may not be worn into surgery.   Bring small overnight bag day of surgery.   DO NOT BRING YOUR HOME MEDICATIONS TO THE HOSPITAL. PHARMACY WILL DISPENSE MEDICATIONS LISTED ON YOUR MEDICATION LIST TO YOU DURING YOUR ADMISSION IN THE HOSPITAL!    Patients discharged on the day of surgery will not be allowed to drive home.  Someone NEEDS to stay with you for the first 24 hours after anesthesia.   Special Instructions: Bring a copy of your healthcare power of attorney and living will documents the day of surgery if you haven't scanned them before.              Please read over the following fact sheets you were given: IF YOU HAVE QUESTIONS ABOUT YOUR PRE-OP INSTRUCTIONS PLEASE CALL  [phone] Katherine Ruiz   If you received a COVID test during your pre-op visit  it is requested that you wear a mask when out in public, stay away from anyone that may not be feeling well and notify your surgeon if you develop symptoms. If you test positive for Covid or have been in contact with anyone that has tested positive in the last 10 days please notify you surgeon.    [facility] - Preparing for Surgery Before surgery, you can play an important role.  Because skin is not sterile, your skin needs to be as free of germs as possible.  You can reduce the number of germs on your skin by washing with CHG (chlorahexidine gluconate) soap before surgery.  CHG is an antiseptic cleaner which kills germs and bonds with the skin to continue killing germs even after washing. Please DO NOT use if you have an allergy to CHG or antibacterial soaps.  If your skin becomes reddened/irritated stop using the CHG and inform your nurse when you  arrive at Short Stay. Do not shave (including legs and underarms) for at least 48 hours prior to the first CHG shower.  You may shave your face/neck.  Please follow these instructions carefully:  1.  Shower with CHG Soap the night before surgery and the morning of surgery.  2.  If you choose to wash your hair, wash your hair first as usual with your normal  shampoo.  3.  After you shampoo, rinse your hair and body thoroughly to remove the shampoo.                             4.  Use CHG as you would any other liquid soap.  You can apply chg directly to the skin and wash.  Gently with a scrungie or clean washcloth.  5.  Apply the CHG Soap to your body ONLY FROM THE NECK DOWN.   Do   not use on face/ open                           Wound or open sores. Avoid contact with eyes, ears mouth and   genitals (private parts).                       Wash face,  Genitals (private parts) with your normal soap.             6.  Wash thoroughly, paying special attention to the area where your    surgery  will be performed.  7.  Thoroughly rinse your body with warm water from the neck down.  8.  DO NOT shower/wash with your normal soap after using and rinsing off the CHG Soap.                9.  Pat yourself dry with a clean towel.            10.  Wear clean pajamas.            11.  Place clean sheets on your bed the night of your first shower and do not  sleep with pets. Day of Surgery : Do not apply any CHG, lotions/deodorants the morning of surgery.  Please wear clean clothes to the hospital/surgery center.  FAILURE TO FOLLOW THESE INSTRUCTIONS MAY RESULT IN THE CANCELLATION OF YOUR SURGERY  PATIENT SIGNATURE_________________________________  NURSE SIGNATURE__________________________________  ________________________________________________________________________ 

## 2024-10-08 NOTE — Progress Notes (Signed)
 COVID Vaccine received:  []  No [x]  Yes Date of any COVID positive Test in last 90 days: no PCP - Comer Gaskins NP Cardiologist - n/a  Chest x-ray - 09/16/24 Epic EKG -   Stress Test -  ECHO -  Cardiac Cath -   Bowel Prep - [x]  No  []   Yes ______  Pacemaker / ICD device [x]  No []  Yes   Spinal Cord Stimulator:[x]  No []  Yes       History of Sleep Apnea? [x]  No []  Yes   CPAP used?- [x]  No []  Yes    Does the patient monitor blood sugar?          [x]  No []  Yes  []  N/A  Patient has: [x]  NO Hx DM   []  Pre-DM                 []  DM1  []   DM2 Does patient have a [employer] or [employer]? []  No []  Yes   Fasting Blood Sugar Ranges-  Checks Blood Sugar _____ times a day  GLP1 agonist / usual dose - no GLP1 instructions:  SGLT-2 inhibitors / usual dose - no SGLT-2 instructions:   Blood Thinner / Instructions:no Aspirin Instructions:no  Comments:   Activity level: Patient is able to climb a flight of stairs without difficulty; [x]  No CP  [x]  No SOB,    Patient can  perform ADLs without assistance.   Anesthesia review:   Patient denies shortness of breath, fever, cough and chest pain at PAT appointment.  Patient verbalized understanding and agreement to the Pre-Surgical Instructions that were given to them at this PAT appointment. Patient was also educated of the need to review these PAT instructions again prior to his/her surgery.I reviewed the appropriate phone numbers to call if they have any and questions or concerns.

## 2024-10-09 ENCOUNTER — Encounter (HOSPITAL_COMMUNITY): Payer: Self-pay

## 2024-10-09 ENCOUNTER — Encounter (HOSPITAL_COMMUNITY)
Admission: RE | Admit: 2024-10-09 | Discharge: 2024-10-09 | Disposition: A | Discharge status: Home / Self Care | Source: Ambulatory Visit | Attending: Urology | Admitting: Urology

## 2024-10-09 ENCOUNTER — Other Ambulatory Visit: Payer: Self-pay

## 2024-10-09 VITALS — BP 119/77 | HR 80 | Temp 98.3°F | Resp 16 | Ht 61.0 in | Wt 211.0 lb

## 2024-10-09 DIAGNOSIS — Z01812 Encounter for preprocedural laboratory examination: Secondary | ICD-10-CM | POA: Insufficient documentation

## 2024-10-09 DIAGNOSIS — Z01818 Encounter for other preprocedural examination: Secondary | ICD-10-CM

## 2024-10-09 LAB — SURGICAL PCR SCREEN
MRSA, PCR: NEGATIVE
Staphylococcus aureus: NEGATIVE

## 2024-10-09 LAB — CBC
HCT: 34.8 % — ABNORMAL LOW (ref 36.0–46.0)
Hemoglobin: 12.5 g/dL (ref 12.0–15.0)
MCH: 34.2 pg — ABNORMAL HIGH (ref 26.0–34.0)
MCHC: 35.9 g/dL (ref 30.0–36.0)
MCV: 95.1 fL (ref 80.0–100.0)
Platelets: 483 K/uL — ABNORMAL HIGH (ref 150–400)
RBC: 3.66 MIL/uL — ABNORMAL LOW (ref 3.87–5.11)
RDW: 12.2 % (ref 11.5–15.5)
WBC: 8.6 K/uL (ref 4.0–10.5)
nRBC: 0 % (ref 0.0–0.2)

## 2024-10-14 DIAGNOSIS — Z01818 Encounter for other preprocedural examination: Secondary | ICD-10-CM

## 2024-10-14 NOTE — H&P (Signed)
 I was consulted to assess the patient's urinary incontinence.  She has urge incontinence with almost no warning.  She can leak with coughing sneezing bending lifting.  She is small-volume bedwetting.  She can soak 6 pads a day.  She said both are significant.   She voids every 2-3 hours gets up 2-3 times a night.  Flow is reasonable   She is get bladder infections.  No hysterectomy.     Grade 2 hypermobility the bladder neck. No stress incontinence with moderate cough. No prolapse    Patient has mixed incontinence.  For years she is stress incontinence and her incontinence is worsened over number years.  I'm suspect that the overactive bladder component is significant and she also small-volume bedwetting.     Patient looks like she might have a bladder infection with microscopic hematuria as well.  Call if culture positive.   Last culture was positive with 2 organisms.  Incontinence stable.  Clinically not infected   On pelvic examination she grade 2 hypermobility bladder neck and negative cough test after cystoscopy   Cystoscopy: Patient underwent flexible cystoscopy.  Bladder mucosa and trigone were normal.  No cystitis.  No carcinoma well-tolerated.   On urodynamics patient did not void and was catheterized for 200 mL.  Maximum bladder capacity was 580 mL.  Bladder was stable.  Her Valsalva leak point pressure at 400 mL was 61 cm of water with moderately severe leakage.  Valsalva leak point pressure of 500 mL was 50 cm of water with moderate leakage.  She did not generate a detrusor contraction during voluntary voiding in the lab setting.  In a private restroom she voided 400 mL and a residual was 180 mL.  Bladder neck descent approximately 1 cm.  Voiding phase was prolonged and she was straining.  I do not think she was actually generating detrusor contractions and the increase in pressure on the detrusor pressure was subtraction artifact the same was seen during the filling phase when we were  checking leak point pressures.  Having said that there may have been a few low pressure detrusor contractions early on in the voiding phase and halfway through.  She had a small cystocele noted    By history patient has mixed incontinence of both are significant. She does have moderately low leak point pressures which could explain her high-volume episodes but this may also be due to bladder overactivity not detected during the test. She also has bedwetting. He also says she gets very little warning. Role of medical and behavioral therapy discussed. If she does not reach her treatment goal a sling and a bulking agent would need to be discussed recognizing that her OAB symptom complex could persist or worsen. I would mention refractory therapies to her but if she is bothered by stress incontinence the stress procedures would likely be the best treatment path for her   Assessment 6 weeks on Gemtesa  samples and prescription.  Call if culture positive.  Physical therapy consultation given which I think is an excellent idea based on her age and severity    Today Patient saw extender November 2025.  She was very bothered by her stress component and was considering surgery.  Gemtesa  did not help her.  Patient once again reiterated she wants her incontinence fixed.  She said she can be sitting in the car and just start leaking.  This can happen when she is standing.  There is no question she leaks with coughing sneezing and laughing.  PMH:     Past Medical History:  Diagnosis Date   Anxiety     Arthritis     Asthma     Asthma exacerbation 08/01/2023   Cervical radiculopathy 09/01/2018   Chest pain 06/01/2022   Chickenpox     COVID-19 virus infection 10/24/2022   Depression     Elevated LFTs 11/30/2022   Family history of breast cancer      4/21 cancer genetic testing letter sent   Hematuria 11/17/2022   History of kidney stones     History of MRSA infection 10/17/2014    Formatting of this  note might be different from the original. Hospitalized at [facility] 08/2014. I+D, IV antibiotics   History of UTI     MRSA (methicillin resistant staph aureus) culture positive 2004    spider bite   Muscle spasm 03/29/2022   Neck pain 03/29/2022   Peripheral edema 11/17/2022   Pyelonephritis 11/17/2022   S/P endometrial ablation 09/15/2021   S/P tubal ligation 09/15/2021          Surgical History:      Past Surgical History:  Procedure Laterality Date   DILATATION AND CURETTAGE/HYSTEROSCOPY WITH MINERVA N/A 09/02/2021    Procedure: DILATATION AND CURETTAGE/HYSTEROSCOPY WITH MINERVA;  Surgeon: Arloa Lamar SQUIBB, MD;  Location: [facility] ORS;  Service: Gynecology;  Laterality: N/A;   FOOT SURGERY Left 11/14/2012    screw in foot   LAPAROSCOPIC BILATERAL SALPINGECTOMY Bilateral 09/02/2021    Procedure: LAPAROSCOPIC BILATERAL SALPINGECTOMY;  Surgeon: Arloa Lamar SQUIBB, MD;  Location: [facility] ORS;  Service: Gynecology;  Laterality: Bilateral;   TUBAL LIGATION       WOUND DEBRIDEMENT Right 12/2012    inner thigh with drain ( spider bite MRSA)          Home Medications:  Allergies as of 09/30/2024         Reactions    Vancomycin  Swelling    Face and throat    Lamotrigine Hives    Septra [sulfamethoxazole-trimethoprim] Rash            Medication List           Accurate as of September 30, 2024 10:04 AM. If you have any questions, ask your nurse or doctor.              albuterol  108 (90 Base) MCG/ACT inhaler Commonly known as: VENTOLIN  HFA Inhale 2 puffs into the lungs every 6 (six) hours as needed for shortness of breath.    amoxicillin -clavulanate 875-125 MG tablet Commonly known as: AUGMENTIN  Take 1 tablet by mouth 2 (two) times daily.    busPIRone  10 MG tablet Commonly known as: BUSPAR  TAKE 1 TABLET BY MOUTH 2 TIMES DAILY FOR ANXIETY    cetirizine 10 MG tablet Commonly known as: ZYRTEC Take 10 mg by mouth in the morning.    DULoxetine  60 MG  capsule Commonly known as: CYMBALTA  TAKE 1 CAPSULE BY MOUTH DAILY FOR ANXIETY AND DEPRESSION.    DULoxetine  30 MG capsule Commonly known as: CYMBALTA  TAKE 1 CAPSULE (30 MG TOTAL) BY MOUTH DAILY. FOR ANXIETY AND DEPRESSION. TAKE WITH 60 MG PILL    fluconazole  150 MG tablet Commonly known as: DIFLUCAN  Take 150 mg by mouth once.    Gemtesa  75 MG Tabs Generic drug: Vibegron  Take 1 tablet (75 mg total) by mouth daily.    guaiFENesin -codeine  100-10 MG/5ML syrup Take 5 mLs by mouth 3 (three) times daily as needed.    predniSONE  20 MG tablet Commonly known  as: DELTASONE  Take 1 tablet (20 mg total) by mouth daily with breakfast.    promethazine -dextromethorphan 6.25-15 MG/5ML syrup Commonly known as: PROMETHAZINE -DM Take 5 mLs by mouth 4 (four) times daily as needed.    Pulmicort  Flexhaler 90 MCG/ACT inhaler Generic drug: Budesonide  Inhale 1-2 puffs into the lungs 2 (two) times daily. For asthma    triamcinolone  ointment 0.5 % Commonly known as: KENALOG  Apply 1 Application topically 2 (two) times daily.             Allergies:  Allergies       Allergies  Allergen Reactions   Vancomycin  Swelling      Face and throat   Lamotrigine Hives   Septra [Sulfamethoxazole-Trimethoprim] Rash        Family History:      Family History  Problem Relation Age of Onset   Arthritis Father     Hyperlipidemia Father     Hypertension Father     Mental illness Father     Diabetes Father     Hyperlipidemia Maternal Grandmother     Arthritis Maternal Grandmother     Breast cancer Maternal Grandmother     Hypertension Maternal Grandmother     Mental illness Maternal Grandmother     Diabetes Maternal Grandmother     Cancer Maternal Grandmother 31        Breast   Cancer - Other Maternal Grandmother          bile duct   Arthritis Maternal Grandfather     Mental illness Paternal Grandmother     Hypertension Paternal Grandmother     Arthritis Paternal Grandmother     Arthritis  Paternal Grandfather     Mental illness Paternal Grandfather     Hypertension Paternal Grandfather            Social History:  reports that she quit smoking about 11 years ago. Her smoking use included cigarettes. She has been exposed to tobacco smoke. She has never used smokeless tobacco. She reports current alcohol use. She reports current drug use. Drug: Marijuana.   ROS:                           Physical Exam: LMP 09/13/2021 (Approximate) Comment: Tubal Ligation  Constitutional:  Alert and oriented, No acute distress. HEENT: [place] AT, moist mucus membranes.  Trachea midline, no masses.     Laboratory Data: Recent Labs       Lab Results  Component Value Date    WBC 10.1 08/24/2024    HGB 12.6 08/24/2024    HCT 37.1 08/24/2024    MCV 96.4 08/24/2024    PLT 307 08/24/2024        Recent Labs       Lab Results  Component Value Date    CREATININE 0.92 08/24/2024        Recent Labs  No results found for: PSA     Recent Labs  No results found for: TESTOSTERONE     Recent Labs       Lab Results  Component Value Date    HGBA1C 5.3 10/06/2020        Urinalysis Labs (Brief)          Component Value Date/Time    COLORURINE YELLOW (A) 08/24/2024 1343    APPEARANCEUR Clear 09/23/2024 1525    LABSPEC 1.015 08/24/2024 1343    LABSPEC 1.016 03/13/2014 1903    PHURINE 5.0 08/24/2024 1343  GLUCOSEU Negative 09/23/2024 1525    GLUCOSEU Negative 03/13/2014 1903    HGBUR SMALL (A) 08/24/2024 1343    BILIRUBINUR Negative 09/23/2024 1525    BILIRUBINUR Negative 03/13/2014 1903    KETONESUR NEGATIVE 08/24/2024 1343    PROTEINUR Negative 09/23/2024 1525    PROTEINUR NEGATIVE 08/24/2024 1343    UROBILINOGEN 0.2 03/26/2024 1532    NITRITE Negative 09/23/2024 1525    NITRITE NEGATIVE 08/24/2024 1343    LEUKOCYTESUR Negative 09/23/2024 1525    LEUKOCYTESUR NEGATIVE 08/24/2024 1343    LEUKOCYTESUR Trace 03/13/2014 1903        Pertinent Imaging:      Assessment & Plan: I spent a lot of time with the patient.  I drew a picture.  She understands that I believe she has a significant overactive bladder and this is why her incontinence is worsening and why she has very little warning and can leak a large volume.  She has moderately severe stress incontinence.  We talked about her family history.  I went over a bulking agent and sling with full templates discussed.  I strongly urged her to try another medication and I called in Myrbetriq  50 mg 30 x 11.  I think she is very well counseled that her overactive bladder and incontinence could persist or worsen with a sling and bulking agent.  Rare risks and sequelae with these treatment discussed in detail.  These also include retention pain and erosion and sequela from bulking agent she is hoping to be helped as soon as possible.  She would like to do them bulking agent under anesthesia in [place] and if this fails after 1 or 2 treatments she will go on to have a sling unless Myrbetriq  works really well.  I do think treating the stress incontinence is a good choice over third line OAB therapies at this stage.  She has had MRSA before and sometimes is tender so I will not do the bulking agent in the office

## 2024-10-15 ENCOUNTER — Other Ambulatory Visit: Payer: Self-pay

## 2024-10-15 ENCOUNTER — Ambulatory Visit (HOSPITAL_COMMUNITY): Admitting: Anesthesiology

## 2024-10-15 ENCOUNTER — Ambulatory Visit (HOSPITAL_COMMUNITY): Payer: Self-pay | Admitting: Medical

## 2024-10-15 ENCOUNTER — Encounter (HOSPITAL_COMMUNITY): Payer: Self-pay | Admitting: Urology

## 2024-10-15 ENCOUNTER — Ambulatory Visit (HOSPITAL_COMMUNITY): Admission: RE | Admit: 2024-10-15 | Discharge: 2024-10-15 | Disposition: A | Attending: Urology | Admitting: Urology

## 2024-10-15 ENCOUNTER — Encounter (HOSPITAL_COMMUNITY): Admission: RE | Disposition: A | Payer: Self-pay | Source: Home / Self Care | Attending: Urology

## 2024-10-15 DIAGNOSIS — Z01818 Encounter for other preprocedural examination: Secondary | ICD-10-CM

## 2024-10-15 SURGERY — INJECTION, BULKING AGENT, URETHRA
Anesthesia: Monitor Anesthesia Care | Site: Urethra

## 2024-10-15 MED ORDER — HYDROCODONE-ACETAMINOPHEN 5-325 MG PO TABS: 1.0000 | ORAL_TABLET | Freq: Four times a day (QID) | ORAL | 0 refills | Status: DC | PRN

## 2024-10-15 MED ORDER — LIDOCAINE HCL (PF) 2 % IJ SOLN
INTRAMUSCULAR | Status: AC
  Filled 2024-10-15: qty 5

## 2024-10-15 MED ORDER — SODIUM CHLORIDE 0.9 % IR SOLN
Status: DC | PRN
  Administered 2024-10-15: 3000 mL

## 2024-10-15 MED ORDER — PROPOFOL 10 MG/ML IV BOLUS
INTRAVENOUS | Status: DC | PRN
  Administered 2024-10-15 (×4): 20 mg via INTRAVENOUS

## 2024-10-15 MED ORDER — KETOROLAC TROMETHAMINE 30 MG/ML IJ SOLN: 30.0000 mg | Freq: Once | INTRAMUSCULAR | Status: DC | PRN

## 2024-10-15 MED ORDER — FENTANYL CITRATE (PF) 100 MCG/2ML IJ SOLN
INTRAMUSCULAR | Status: AC
  Filled 2024-10-15: qty 2

## 2024-10-15 MED ORDER — LIDOCAINE HCL (CARDIAC) PF 100 MG/5ML IV SOSY
PREFILLED_SYRINGE | INTRAVENOUS | Status: DC | PRN
  Administered 2024-10-15: 100 mg via INTRAVENOUS

## 2024-10-15 MED ORDER — FENTANYL CITRATE (PF) 100 MCG/2ML IJ SOLN
INTRAMUSCULAR | Status: DC | PRN
  Administered 2024-10-15: 25 ug via INTRAVENOUS
  Administered 2024-10-15 (×3): 50 ug via INTRAVENOUS
  Administered 2024-10-15: 25 ug via INTRAVENOUS

## 2024-10-15 MED ORDER — HYDROMORPHONE HCL 1 MG/ML IJ SOLN
INTRAMUSCULAR | Status: DC | PRN
  Administered 2024-10-15 (×2): 1 mg via INTRAVENOUS

## 2024-10-15 MED ORDER — MIDAZOLAM HCL 2 MG/2ML IJ SOLN
INTRAMUSCULAR | Status: AC
  Filled 2024-10-15: qty 2

## 2024-10-15 MED ORDER — OXYCODONE HCL 5 MG/5ML PO SOLN: 5.0000 mg | Freq: Once | ORAL | Status: AC | PRN

## 2024-10-15 MED ORDER — MIDAZOLAM HCL (PF) 2 MG/2ML IJ SOLN
INTRAMUSCULAR | Status: DC | PRN
  Administered 2024-10-15: 2 mg via INTRAVENOUS

## 2024-10-15 MED ORDER — ORAL CARE MOUTH RINSE: 15.0000 mL | Freq: Once | OROMUCOSAL | Status: AC

## 2024-10-15 MED ORDER — ACETAMINOPHEN 500 MG PO TABS
1000.0000 mg | ORAL_TABLET | Freq: Once | ORAL | Status: AC
  Administered 2024-10-15: 1000 mg via ORAL
  Filled 2024-10-15: qty 2

## 2024-10-15 MED ORDER — ONDANSETRON HCL 4 MG/2ML IJ SOLN: 4.0000 mg | Freq: Once | INTRAMUSCULAR | Status: DC | PRN

## 2024-10-15 MED ORDER — OXYCODONE HCL 5 MG PO TABS
5.0000 mg | ORAL_TABLET | Freq: Once | ORAL | Status: AC | PRN
  Administered 2024-10-15: 5 mg via ORAL

## 2024-10-15 MED ORDER — KETOROLAC TROMETHAMINE 30 MG/ML IJ SOLN
INTRAMUSCULAR | Status: DC | PRN
  Administered 2024-10-15: 30 mg via INTRAVENOUS

## 2024-10-15 MED ORDER — AMISULPRIDE (ANTIEMETIC) 5 MG/2ML IV SOLN: 10.0000 mg | Freq: Once | INTRAVENOUS | Status: DC | PRN

## 2024-10-15 MED ORDER — PROPOFOL 500 MG/50ML IV EMUL
INTRAVENOUS | Status: DC | PRN
  Administered 2024-10-15: 75 ug/kg/min via INTRAVENOUS

## 2024-10-15 MED ORDER — OXYCODONE HCL 5 MG PO TABS
ORAL_TABLET | ORAL | Status: AC
  Filled 2024-10-15: qty 1

## 2024-10-15 MED ORDER — CIPROFLOXACIN IN D5W 400 MG/200ML IV SOLN
400.0000 mg | INTRAVENOUS | Status: AC
  Administered 2024-10-15: 400 mg via INTRAVENOUS
  Filled 2024-10-15: qty 200

## 2024-10-15 MED ORDER — MEPERIDINE HCL 25 MG/ML IJ SOLN: 6.2500 mg | INTRAMUSCULAR | Status: DC | PRN

## 2024-10-15 MED ORDER — HYDROMORPHONE HCL 2 MG/ML IJ SOLN
INTRAMUSCULAR | Status: AC
  Filled 2024-10-15: qty 1

## 2024-10-15 MED ORDER — HYDROMORPHONE HCL 1 MG/ML IJ SOLN: 0.2500 mg | INTRAMUSCULAR | Status: DC | PRN

## 2024-10-15 MED ORDER — PROPOFOL 1000 MG/100ML IV EMUL
INTRAVENOUS | Status: AC
  Filled 2024-10-15: qty 100

## 2024-10-15 MED ORDER — CHLORHEXIDINE GLUCONATE 0.12 % MT SOLN
15.0000 mL | Freq: Once | OROMUCOSAL | Status: AC
  Administered 2024-10-15: 15 mL via OROMUCOSAL

## 2024-10-15 MED ORDER — CIPROFLOXACIN HCL 250 MG PO TABS: 250.0000 mg | ORAL_TABLET | Freq: Two times a day (BID) | ORAL | 0 refills | Status: DC

## 2024-10-15 MED ORDER — LACTATED RINGERS IV SOLN: INTRAVENOUS | Status: DC

## 2024-10-15 MED ORDER — ONDANSETRON HCL 4 MG/2ML IJ SOLN
INTRAMUSCULAR | Status: DC | PRN
  Administered 2024-10-15: 4 mg via INTRAVENOUS

## 2024-10-15 MED ORDER — ONDANSETRON HCL 4 MG/2ML IJ SOLN
INTRAMUSCULAR | Status: AC
  Filled 2024-10-15: qty 2

## 2024-10-15 MED ORDER — PROPOFOL 10 MG/ML IV BOLUS
INTRAVENOUS | Status: AC
  Filled 2024-10-15: qty 20

## 2024-10-15 SURGICAL SUPPLY — 11 items
BAG URO CATCHER STRL LF (MISCELLANEOUS) ×1 IMPLANT
CATH ROBINSON RED A/P 14FR (CATHETERS) IMPLANT
GLOVE BIO SURGEON STRL SZ7.5 (GLOVE) IMPLANT
GLOVE BIOGEL M STRL SZ7.5 (GLOVE) ×1 IMPLANT
GOWN STRL REUS W/ TWL LRG LVL3 (GOWN DISPOSABLE) ×1 IMPLANT
KIT TURNOVER KIT A (KITS) ×1 IMPLANT
MANIFOLD NEPTUNE II (INSTRUMENTS) ×1 IMPLANT
PACK CYSTO (CUSTOM PROCEDURE TRAY) ×1 IMPLANT
SYSTEM URETHRAL BULK BULKAMID (Female Continence) IMPLANT
TUBING CONNECTING 10 (TUBING) ×1 IMPLANT
WATER STERILE IRR 3000ML UROMA (IV SOLUTION) ×1 IMPLANT

## 2024-10-15 NOTE — Interval H&P Note (Signed)
 History and Physical Interval Note:  10/15/2024 12:59 PM  Katherine Ruiz  has presented today for surgery, with the diagnosis of STRESS URINARY INCONTINENCE.  The various methods of treatment have been discussed with the patient and family. After consideration of risks, benefits and other options for treatment, the patient has consented to  Procedure(s): INJECTION, BULKING AGENT, URETHRA (N/A) as a surgical intervention.  The patient's history has been reviewed, patient examined, no change in status, stable for surgery.  I have reviewed the patient's chart and labs.  Questions were answered to the patient's satisfaction.     Katherine Ruiz

## 2024-10-15 NOTE — Transfer of Care (Signed)
 Immediate Anesthesia Transfer of Care Note  Patient: Katherine Ruiz  Procedure(s) Performed: INJECTION, BULKING AGENT, URETHRA (Urethra)  Patient Location: PACU  Anesthesia Type:MAC  Level of Consciousness: awake, alert , and oriented  Airway & Oxygen Therapy: Patient Spontanous Breathing and Patient connected to face mask oxygen  Post-op Assessment: Report given to RN and Post -op Vital signs reviewed and stable  Post vital signs: Reviewed and stable  Last Vitals:  Vitals Value Taken Time  BP 116/75 10/15/24 14:52  Temp    Pulse 64 10/15/24 14:54  Resp    SpO2 100 % 10/15/24 14:54  Vitals shown include unfiled device data.  Last Pain:  Vitals:   10/15/24 1302  TempSrc:   PainSc: 0-No pain         Complications: No notable events documented.

## 2024-10-15 NOTE — Anesthesia Postprocedure Evaluation (Signed)
 Anesthesia Post Note  Patient: Katherine Ruiz  Procedure(s) Performed: INJECTION, BULKING AGENT, URETHRA (Urethra)     Patient location during evaluation: PACU Anesthesia Type: MAC Level of consciousness: awake and alert Pain management: pain level controlled Vital Signs Assessment: post-procedure vital signs reviewed and stable Respiratory status: spontaneous breathing, nonlabored ventilation and respiratory function stable Cardiovascular status: blood pressure returned to baseline and stable Postop Assessment: no apparent nausea or vomiting Anesthetic complications: no   No notable events documented.  Last Vitals:  Vitals:   10/15/24 1500 10/15/24 1515  BP: (!) 118/59 95/67  Pulse: 77 (!) 55  Resp: 14 14  Temp:    SpO2: 93% 100%    Last Pain:  Vitals:   10/15/24 1452  TempSrc:   PainSc: 0-No pain                 Katherine Ruiz

## 2024-10-15 NOTE — Op Note (Signed)
 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 Glendia Elizabeth  The patient diagnosis and consented by procedure.  Extra care was taken by positioning.  Injection scope was utilized.  She is given preoperative antibiotics  She had a straight urethra.  I injected 5 and 7:00 and 10 antibiotic.  She had excellent coaptation of the urethra and good the neurostimulator catheter was partially emptied her bladder she will be followed as per protocol

## 2024-10-15 NOTE — Anesthesia Procedure Notes (Signed)
 Date/Time: 10/15/2024 2:10 PM  Performed by: Therisa Doyal CROME, CRNAOxygen Delivery Method: Simple face mask

## 2024-10-15 NOTE — Anesthesia Preprocedure Evaluation (Addendum)
 Anesthesia Evaluation  Patient identified by MRN, date of birth, ID band Patient awake    Reviewed: Allergy & Precautions, NPO status , Patient's Chart, lab work & pertinent test results  Airway Mallampati: II  TM Distance: >3 FB Neck ROM: Full    Dental  (+) Teeth Intact, Dental Advisory Given   Pulmonary asthma (albuterol  seasonally) , former smoker   Pulmonary exam normal breath sounds clear to auscultation       Cardiovascular negative cardio ROS Normal cardiovascular exam Rhythm:Regular Rate:Normal     Neuro/Psych  PSYCHIATRIC DISORDERS Anxiety Depression       GI/Hepatic negative GI ROS, Neg liver ROS,,,  Endo/Other    Class 3 obesity (BMI 40)  Renal/GU negative Renal ROS Bladder dysfunction (stres incontinence)      Musculoskeletal  (+) Arthritis , Osteoarthritis,    Abdominal  (+) + obese  Peds  Hematology negative hematology ROS (+) Hb 12.5   Anesthesia Other Findings   Reproductive/Obstetrics Hx salpingectomy, no period x 4 years- refusing pregnancy test                              Anesthesia Physical Anesthesia Plan  ASA: 3  Anesthesia Plan: MAC   Post-op Pain Management:    Induction:   PONV Risk Score and Plan: 2 and Propofol  infusion and TIVA  Airway Management Planned: Natural Airway and Simple Face Mask  Additional Equipment: None  Intra-op Plan:   Post-operative Plan:   Informed Consent:   Plan Discussed with:   Anesthesia Plan Comments:          Anesthesia Quick Evaluation

## 2024-10-16 ENCOUNTER — Encounter (HOSPITAL_COMMUNITY): Payer: Self-pay | Admitting: Urology

## 2024-12-10 ENCOUNTER — Encounter: Payer: Self-pay | Admitting: Urology

## 2024-12-10 DIAGNOSIS — N3946 Mixed incontinence: Secondary | ICD-10-CM

## 2024-12-11 ENCOUNTER — Ambulatory Visit: Admitting: Primary Care

## 2024-12-11 VITALS — BP 100/60 | HR 77 | Temp 97.6°F | Ht 61.0 in | Wt 216.0 lb

## 2024-12-11 DIAGNOSIS — Z6841 Body Mass Index (BMI) 40.0 and over, adult: Secondary | ICD-10-CM | POA: Diagnosis not present

## 2024-12-11 DIAGNOSIS — L0292 Furuncle, unspecified: Secondary | ICD-10-CM | POA: Insufficient documentation

## 2024-12-11 DIAGNOSIS — E66813 Obesity, class 3: Secondary | ICD-10-CM

## 2024-12-11 DIAGNOSIS — J4520 Mild intermittent asthma, uncomplicated: Secondary | ICD-10-CM

## 2024-12-11 DIAGNOSIS — N3946 Mixed incontinence: Secondary | ICD-10-CM | POA: Diagnosis not present

## 2024-12-11 LAB — LIPID PANEL
Cholesterol: 210 mg/dL — ABNORMAL HIGH (ref 28–200)
HDL: 65.1 mg/dL
LDL Cholesterol: 110 mg/dL — ABNORMAL HIGH (ref 10–99)
NonHDL: 144.44
Total CHOL/HDL Ratio: 3
Triglycerides: 170 mg/dL — ABNORMAL HIGH (ref 10.0–149.0)
VLDL: 34 mg/dL (ref 0.0–40.0)

## 2024-12-11 LAB — HEMOGLOBIN A1C: Hgb A1c MFr Bld: 5.4 % (ref 4.6–6.5)

## 2024-12-11 MED ORDER — ALBUTEROL SULFATE HFA 108 (90 BASE) MCG/ACT IN AERS: 2.0000 | INHALATION_SPRAY | Freq: Four times a day (QID) | RESPIRATORY_TRACT | 0 refills | Status: AC | PRN

## 2024-12-11 MED ORDER — MUPIROCIN 2 % EX OINT: 1.0000 | TOPICAL_OINTMENT | Freq: Two times a day (BID) | CUTANEOUS | 0 refills | Status: AC | PRN

## 2024-12-11 MED ORDER — WEGOVY 0.25 MG/0.5ML ~~LOC~~ SOAJ: 0.2500 mg | SUBCUTANEOUS | 0 refills | Status: AC

## 2024-12-11 NOTE — Assessment & Plan Note (Signed)
 Her current boils appear to be healing properly. No signs of infection.  Continue mupirocin  ointment as needed.  Refill provided today. We discussed the signs/symptoms of infection and when to update.  Referral placed to dermatology.

## 2024-12-11 NOTE — Progress Notes (Signed)
 "  Subjective:    Patient ID: Ruiz Ruiz, female    DOB: [birth year], 34 y.o.   MRN: [id number]  Katherine Ruiz is a very pleasant 34 y.o. female with a history of culture positive MRSA who presents today to discuss boils. She would also like to discuss weight loss.  Is also needing a refill of her albuterol  inhaler.   1) Boils: Chronic history which dates back to around 2004.  Symptom onset two days ago with boils to the right upper medial lower extremity and left lower abdomen. Two weeks ago she completed a bikini wax. She denies complications or cuts from the wax. Her boils are painful. The boil to her left lower abdomen has slightly leaked bloody pus. She's noticed improvement.   Chronic history of the same sores to the bilateral groin, buttocks, and lower extremities. During her prior history she was hospitalized due to antibiotic resistance from over treatment of boils, culture positive MRSA, was admitted and treated with vancomycin .   Over the years she has been proactive to prevent her boils including avoiding shaving keeping warm sites dry, and applying mupirocin  any time she cuts herself.  She does not see dermatology.  Typically, she experiences breakouts anywhere from a few months to a few years apart.  She is ready for a long-term solution.  2) Class 3 Obesity: Chronic history with gradual weight gain over the years. More recently she's been working on eating smaller portion sizes and exercising but finds that not helping much. She doesn't eat much during the day due to her busy schedule. She's tried weight watchers, calorie counting without improvement. She's more sedentary as she's back in school.  She does believe that there is room for improvement in her diet but finds her busy schedule to be a barrier for healthy eating.  She's interested in GLP 1 agonist treatment. She has a family history of diabetes.  She continues to struggle with severe urinary incontinence and is following  with urology for bladder sling surgery.  She is aware that her weight contributes.  Body mass index is 40.81 kg/m.    Review of Systems  Constitutional:  Negative for fever.  Respiratory:  Negative for shortness of breath.   Cardiovascular:  Negative for chest pain.  Skin:  Positive for color change and wound.         Past Medical History:  Diagnosis Date   Anxiety    Arthritis    Asthma    Asthma exacerbation 08/01/2023   Cervical radiculopathy 09/01/2018   Chest pain 06/01/2022   Chickenpox    COVID-19 virus infection 10/24/2022   Depression    Elevated LFTs 11/30/2022   Family history of breast cancer    4/21 cancer genetic testing letter sent   Hematuria 11/17/2022   History of kidney stones    History of MRSA infection 10/17/2014   Formatting of this note might be different from the original. Hospitalized at [facility] 08/2014. I+D, IV antibiotics   History of UTI    MRSA (methicillin resistant staph aureus) culture positive 2004   spider bite   Muscle spasm 03/29/2022   Neck pain 03/29/2022   Peripheral edema 11/17/2022   Pyelonephritis 11/17/2022   S/P endometrial ablation 09/15/2021   S/P tubal ligation 09/15/2021    Social History   Socioeconomic History   Marital status: Married    Spouse name: Not on file   Number of children: Not on file   Years of education:  Not on file   Highest education level: Not on file  Occupational History   Not on file  Tobacco Use   Smoking status: Former    Current packs/day: 0.00    Types: Cigarettes    Quit date: 10/04/2012    Years since quitting: 12.1    Passive exposure: Past   Smokeless tobacco: Never  Vaping Use   Vaping status: Never Used  Substance and Sexual Activity   Alcohol use: Yes    Comment: rarely   Drug use: Yes    Types: Marijuana   Sexual activity: Yes  Other Topics Concern   Not on file  Social History Narrative   Married   2 children born 2010 and 2011, both girls   Works  as an Airline Pilot for [employer]   Enjoys walking, running, spending time with her family   12/22/2016   Social Drivers of Health   Tobacco Use: Medium Risk (10/15/2024)   Patient History    Smoking Tobacco Use: Former    Smokeless Tobacco Use: Never    Passive Exposure: Past  Programmer, Applications: Not on [employer] Insecurity: Not on file  Transportation Needs: Not on file  Physical Activity: Not on file  Stress: Not on file  Social Connections: Unknown (03/17/2022)   Received [facility]   Social Network    Social Network: Not on file  Intimate Partner Violence: Unknown (02/16/2022)   Received from [facility]   HITS    Physically Hurt: Not on file    Insult or Talk Down To: Not on file    Threaten Physical Harm: Not on file    Scream or Curse: Not on file  Depression (PHQ2-9): High Risk (09/16/2024)   Depression (PHQ2-9)    PHQ-2 Score: 16  Alcohol Screen: Not on file  Housing: Not on file  Utilities: Not on file  Health Literacy: Not on file    Past Surgical History:  Procedure Laterality Date   DILATATION AND CURETTAGE/HYSTEROSCOPY WITH MINERVA N/A 09/02/2021   Procedure: DILATATION AND CURETTAGE/HYSTEROSCOPY WITH MINERVA;  Surgeon: Arloa Lamar SQUIBB, MD;  Location: [facility] ORS;  Service: Gynecology;  Laterality: N/A;   FOOT SURGERY Left 11/14/2012   screw in foot   INJECTION, BULKING AGENT, URETHRA N/A 10/15/2024   Procedure: INJECTION, BULKING AGENT, URETHRA;  Surgeon: Gaston Hamilton, MD;  Location: [facility];  Service: Urology;  Laterality: N/A;   LAPAROSCOPIC BILATERAL SALPINGECTOMY Bilateral 09/02/2021   Procedure: LAPAROSCOPIC BILATERAL SALPINGECTOMY;  Surgeon: Arloa Lamar SQUIBB, MD;  Location: [facility] ORS;  Service: Gynecology;  Laterality: Bilateral;   TUBAL LIGATION     WOUND DEBRIDEMENT Right 12/2012   inner thigh with drain ( spider bite MRSA)    Family History  Problem Relation Age of Onset   Arthritis Father    Hyperlipidemia Father     Hypertension Father    Mental illness Father    Diabetes Father    Hyperlipidemia Maternal Grandmother    Arthritis Maternal Grandmother    Breast cancer Maternal Grandmother    Hypertension Maternal Grandmother    Mental illness Maternal Grandmother    Diabetes Maternal Grandmother    Cancer Maternal Grandmother 105       Breast   Cancer - Other Maternal Grandmother        bile duct   Arthritis Maternal Grandfather    Mental illness Paternal Grandmother    Hypertension Paternal Grandmother    Arthritis Paternal Grandmother    Arthritis Paternal  Grandfather    Mental illness Paternal Grandfather    Hypertension Paternal Grandfather     Allergies[1]  Medications Ordered Prior to Encounter[2]  BP 100/60   Pulse 77   Temp 97.6 F (36.4 C) (Oral)   Ht 5' 1 (1.549 m)   Wt 216 lb (98 kg)   SpO2 95%   BMI 40.81 kg/m  Objective:   Physical Exam Cardiovascular:     Rate and Rhythm: Normal rate.  Pulmonary:     Effort: Pulmonary effort is normal.  Musculoskeletal:     Cervical back: Neck supple.  Skin:    General: Skin is warm and dry.     Comments: 1 cm raised, darker red, closed wound to right medial upper portion of lower extremity  1 cm slightly raised, darker red, closed wound to the left lower abdomen near abdominal fold.  No drainage.  No deep masses.  Neurological:     Mental Status: She is alert and oriented to person, place, and time.  Psychiatric:        Mood and Affect: Mood normal.     Physical Exam        Assessment & Plan:  Recurrent boils Assessment & Plan: Her current boils appear to be healing properly. No signs of infection.  Continue mupirocin  ointment as needed.  Refill provided today. We discussed the signs/symptoms of infection and when to update.  Referral placed to dermatology.  Orders: -     Ambulatory referral to Dermatology -     Mupirocin ; Apply 1 Application topically 2 (two) times daily as needed.  Dispense: 30 g;  Refill: 0  Mild intermittent asthma without complication -     Albuterol  Sulfate HFA; Inhale 2 puffs into the lungs every 6 (six) hours as needed for shortness of breath.  Dispense: 1 each; Refill: 0  Class 3 severe obesity due to excess calories without serious comorbidity with body mass index (BMI) of 40.0 to 44.9 in adult [facility]) Assessment & Plan: She is certainly a candidate for GLP-1 agonist treatment given her BMI. We discussed different options for weight management.  She is aware that she needs to improve her diet and will continue to work on this. Referral placed to healthy weight wellness center for further guidance.  Start semaglutide  (Wegovy ) for weight loss. Start by injecting 0.25 mg into the skin once weekly for 4 weeks, then increase to 0.5 mg once weekly thereafter.   Follow-up in 3 months  Orders: -     Amb Ref to Medical Weight Management -     Hemoglobin A1c -     Lipid panel -     Wegovy ; Inject 0.25 mg into the skin once a week.  Dispense: 2 mL; Refill: 0  Mixed stress and urge urinary incontinence -     Wegovy ; Inject 0.25 mg into the skin once a week.  Dispense: 2 mL; Refill: 0    Assessment and Plan Assessment & Plan         Comer MARLA Gaskins, NP       [1]  Allergies Allergen Reactions   Vancomycin  Swelling    Face and throat   Lamotrigine Hives   Septra [Sulfamethoxazole-Trimethoprim] Rash  [2]  Current Outpatient Medications on File Prior to Visit  Medication Sig Dispense Refill   Ascorbic Acid (VITAMIN C PO) Take 1 tablet by mouth daily as needed (immune system support).     DULoxetine  (CYMBALTA ) 30 MG capsule TAKE 1 CAPSULE (30 MG TOTAL)  BY MOUTH DAILY. FOR ANXIETY AND DEPRESSION. TAKE WITH 60 MG PILL 90 capsule 2   DULoxetine  (CYMBALTA ) 60 MG capsule TAKE 1 CAPSULE BY MOUTH DAILY FOR ANXIETY AND DEPRESSION. 90 capsule 2   loratadine (CLARITIN) 10 MG tablet Take 10 mg by mouth daily as needed for allergies.     mirabegron  ER  (MYRBETRIQ ) 50 MG TB24 tablet Take 1 tablet (50 mg total) by mouth daily. 30 tablet 11   No current facility-administered medications on file prior to visit.   "

## 2024-12-11 NOTE — Assessment & Plan Note (Signed)
 She is certainly a candidate for GLP-1 agonist treatment given her BMI. We discussed different options for weight management.  She is aware that she needs to improve her diet and will continue to work on this. Referral placed to healthy weight wellness center for further guidance.  Start semaglutide  (Wegovy ) for weight loss. Start by injecting 0.25 mg into the skin once weekly for 4 weeks, then increase to 0.5 mg once weekly thereafter.   Follow-up in 3 months

## 2024-12-11 NOTE — Patient Instructions (Addendum)
 Stop by the lab prior to leaving today. I will notify you of your results once received.   Start semaglutide  (Wegovy ) for weight loss. Start by injecting 0.25 mg into the skin once weekly for 4 weeks, then increase to 0.5 mg once weekly thereafter.  Please notify me once you have used your last 0.25 mg pen so that I can send the 0.5 mg dose to your pharmacy.  You will either be contacted via phone regarding your referral to dermatology and healthy weight and wellness, or you may receive a letter on your MyChart portal from our referral team with instructions for scheduling an appointment. Please let us  know if you have not been contacted by anyone within two weeks.  Please schedule a follow up visit for 3 months for weight check.  It was a pleasure to see you today!

## 2024-12-12 ENCOUNTER — Ambulatory Visit: Payer: Self-pay | Admitting: Primary Care

## 2024-12-18 ENCOUNTER — Encounter (INDEPENDENT_AMBULATORY_CARE_PROVIDER_SITE_OTHER): Payer: Self-pay

## 2024-12-19 ENCOUNTER — Ambulatory Visit (INDEPENDENT_AMBULATORY_CARE_PROVIDER_SITE_OTHER): Admitting: Family Medicine

## 2024-12-19 ENCOUNTER — Encounter (INDEPENDENT_AMBULATORY_CARE_PROVIDER_SITE_OTHER): Payer: Self-pay | Admitting: Family Medicine

## 2024-12-19 VITALS — BP 101/69 | HR 67 | Temp 97.9°F | Ht 61.5 in | Wt 212.0 lb

## 2024-12-19 DIAGNOSIS — Z0289 Encounter for other administrative examinations: Secondary | ICD-10-CM

## 2024-12-19 DIAGNOSIS — N3946 Mixed incontinence: Secondary | ICD-10-CM

## 2024-12-19 DIAGNOSIS — Z6839 Body mass index (BMI) 39.0-39.9, adult: Secondary | ICD-10-CM

## 2024-12-19 DIAGNOSIS — F4323 Adjustment disorder with mixed anxiety and depressed mood: Secondary | ICD-10-CM | POA: Insufficient documentation

## 2024-12-19 NOTE — Progress Notes (Incomplete)
 " Barnie DOROTHA Jenkins, DO, ABFM, ABOM Bariatric physician [address] [place], [place], [place] [address] Office: [phone]  /  Fax: [phone]     Initial Evaluation:  Katherine Ruiz was seen in clinic today to evaluate for obesity. She is interested in losing weight to improve overall health and reduce the risk of weight related complications. She presents today to review program treatment options, initial physical assessment, and evaluation.     Has 2 daughter ages 61 and 49.   She was referred by: PCP  When asked what they hope to accomplish? She states: improve existing medical conditions, reduce numbers of medications, improve quality of life.  When asked how has your weight affected you? She states: Contributed to medical problems, Contributed to orthopedic problems or mobility issues( back pain), Having fatigue, Having poor endurance, and Has affected mood   Contributing factors to her weight change: family history of obesity, disruption of circadian rhythm / sleep disordered breathing, consumption of processed foods, moderate to high levels of stress, reduced physical activity, and life event ( getting divorced)   Some associated conditions: Hyperlipidemia, Prediabetes( over 10 years ago or longer) , Overactive bladder, and Lung disease ( well controlled asthma)   Current nutrition plan: None  Current level of physical activity: Walking 45-60 minutes, three a week  Current or previous pharmacotherapy: Other: recently prescribed GLP-1 but has not started  Response to medication: Never tried medications   Barriers to weight loss that patient expresses a concern about today: contraceptive or hormonal therapy (depo).     Past Medical History:  Diagnosis Date   Anxiety    Arthritis    Asthma    Asthma exacerbation 08/01/2023   Cervical radiculopathy 09/01/2018   Chest pain 06/01/2022   Chickenpox    COVID-19 virus infection 10/24/2022   Depression    Elevated LFTs  11/30/2022   Family history of breast cancer    4/21 cancer genetic testing letter sent   Hematuria 11/17/2022   History of kidney stones    History of MRSA infection 10/17/2014   Formatting of this note might be different from the original. Hospitalized at [facility] 08/2014. I+D, IV antibiotics   History of UTI    MRSA (methicillin resistant staph aureus) culture positive 2004   spider bite   Muscle spasm 03/29/2022   Neck pain 03/29/2022   Peripheral edema 11/17/2022   Pyelonephritis 11/17/2022   S/P endometrial ablation 09/15/2021   S/P tubal ligation 09/15/2021    Current Outpatient Medications  Medication Instructions   albuterol  (VENTOLIN  HFA) 108 (90 Base) MCG/ACT inhaler 2 puffs, Inhalation, Every 6 hours PRN   Ascorbic Acid (VITAMIN C PO) 1 tablet, Daily PRN   DULoxetine  (CYMBALTA ) 60 MG capsule TAKE 1 CAPSULE BY MOUTH DAILY FOR ANXIETY AND DEPRESSION.   DULoxetine  (CYMBALTA ) 30 mg, Oral, Daily, for anxiety and depression. Take with 60 mg pill   loratadine (CLARITIN) 10 mg, Daily PRN   mirabegron  ER (MYRBETRIQ ) 50 mg, Oral, Daily   mupirocin  ointment (BACTROBAN ) 2 % 1 Application, Topical, 2 times daily PRN   Wegovy  0.25 mg, Subcutaneous, Weekly     Allergies[1]   Past Surgical History:  Procedure Laterality Date   DILATATION AND CURETTAGE/HYSTEROSCOPY WITH MINERVA N/A 09/02/2021   Procedure: DILATATION AND CURETTAGE/HYSTEROSCOPY WITH MINERVA;  Surgeon: Arloa Lamar SQUIBB, MD;  Location: [facility] ORS;  Service: Gynecology;  Laterality: N/A;   FOOT SURGERY Left 11/14/2012   screw in foot   INJECTION, BULKING AGENT, URETHRA N/A  10/15/2024   Procedure: INJECTION, BULKING AGENT, URETHRA;  Surgeon: Gaston Hamilton, MD;  Location: [facility];  Service: Urology;  Laterality: N/A;   LAPAROSCOPIC BILATERAL SALPINGECTOMY Bilateral 09/02/2021   Procedure: LAPAROSCOPIC BILATERAL SALPINGECTOMY;  Surgeon: Arloa Lamar SQUIBB, MD;  Location: [facility] ORS;  Service: Gynecology;  Laterality:  Bilateral;   TUBAL LIGATION     WOUND DEBRIDEMENT Right 12/2012   inner thigh with drain ( spider bite MRSA)     Family History  Problem Relation Age of Onset   Arthritis Father    Hyperlipidemia Father    Hypertension Father    Mental illness Father    Diabetes Father    Hyperlipidemia Maternal Grandmother    Arthritis Maternal Grandmother    Breast cancer Maternal Grandmother    Hypertension Maternal Grandmother    Mental illness Maternal Grandmother    Diabetes Maternal Grandmother    Cancer Maternal Grandmother 73       Breast   Cancer - Other Maternal Grandmother        bile duct   Arthritis Maternal Grandfather    Mental illness Paternal Grandmother    Hypertension Paternal Grandmother    Arthritis Paternal Grandmother    Arthritis Paternal Grandfather    Mental illness Paternal Grandfather    Hypertension Paternal Grandfather      Objective:  BP 101/69   Pulse 67   Temp 97.9 F (36.6 C)   Ht 5' 1.5 (1.562 m)   Wt 212 lb (96.2 kg)   SpO2 98%   BMI 39.41 kg/m  She was weighed on the bioimpedance scale: Body mass index is 39.41 kg/m.  Visceral Fat rating : 11, Body Fat %:44  Vitals Temp: 97.9 F (36.6 C) BP: 101/69 Pulse Rate: 67 SpO2: 98 %   Anthropometric Measurements Height: 5' 1.5 (1.562 m) Weight: 212 lb (96.2 kg) BMI (Calculated): 39.41 Peak Weight: 212   Body Composition  Body Fat %: 44 % Fat Mass (lbs): 93.2 lbs Muscle Mass (lbs): 112.8 lbs Total Body Water (lbs): 80.8 lbs Visceral Fat Rating : 11   Other Clinical Data Fasting: No Labs: No Today's Visit #: Consult     General: Well Developed, well nourished, and in no acute distress.  HEENT: Normocephalic, atraumatic; EOMI, sclerae are anicteric. Skin: Warm and dry, good turgor Chest:  Normal excursion, shape, no gross ABN Respiratory: No conversational dyspnea; speaking in full sentences NeuroM-Sk:  Normal gross ROM * 4 extremities  Psych: A and O *3, insight adequate,  mood- full    Assessment and Plan:   FOR THE DISEASE OF OBESITY:  BMI 39.0-39.9,adult Assessment & Plan: We reviewed anthropometrics, biometrics, associated medical conditions and contributing factors with patient. Misbah would benefit from a medically tailored reduced calorie nutrional plan based on their REE (resting energy expenditure), which will be determined by indirect calorimetry.  We will also assess for cardiometabolic risk and nutritional derangements via fasting labs at intake appointment.    Obesity Treatment / Action Plan:   she was weighed on the bioimpedance scale and results were discussed and documented in the synopsis.   OCEANIA NOORI will complete provided nutritional and psychosocial assessment questionnaire before the next appointment.  she will be scheduled for indirect calorimetry to determine resting energy expenditure in a fasting state.  This will allow us  to create a reduced calorie, high-protein meal plan to promote loss of fat mass while preserving muscle mass.  We will also assess for cardiometabolic risk and nutritional derangements via an  ECG and fasting serologies at her next appointment.  she was encouraged to work on amassing support from family and friends to begin their weight loss journey.   Work on eliminating or reducing the presence of highly processed, poorly nutritious, calorie-dense foods in the home.   Obesity Education Performed Today:  Patient was counseled on nutritional approaches to weight loss and benefits of reducing processed foods and consuming plant-based foods and high quality protein as part of nutritional weight management program.   We discussed the importance of long term lifestyle changes which include nutrition, exercise and behavioral modifications as well as the importance of customizing this to her specific health and social needs.   We discussed the benefits of reaching a healthier weight to alleviate the symptoms  of existing conditions and reduce the risks of the biomechanical, metabolic and psychological effects of obesity.  Was counseled on the health benefits of losing 5%-10% of total body weight.  Was counseled on our cognitive behavorial therapy program, lead by our bariatric psychologist, who focuses on emotional eating and creating positive behavorial change.  Was counseled on bariatric pharmacotherapy and how this may be used as an adjunct in their weight management    Kitiara appears to be in the action stage of change and states they are ready to start intensive lifestyle modifications and behavioral modifications.  It was recommended that she follow up in the next 1-2 weeks to review the above steps, and to continue with treatment of their chronic disease state of obesity   FOR OTHER CONDITIONS RELATED TO THE DISEASE OF OBESITY:  Mixed stress and urge urinary incontinence Assessment & Plan On Myrbetriq  50 mg daily with reported good compliance and tolerance. Patient states that she has had this conditions since she had has her kids. She reports that is has progressively gotten worse in the last 2 years and she has been on different medications in the last couple of years and on this medication for the last 2 years. She states that she is going to have surgery in the near future. She reports that as she gains weights the symptoms have gotten worse. If she coughs or sneezes she full pees and it goes through her pad she reports. Patient would benefit from a program like this one that focuses on a high protein meal plan and a weight loss journey to help reduce symptoms as weight comes off.     Adjustment disorder with mixed anxiety and depressed mood Assessment & Plan On Cymbalta  90 mg daily with reported good compliance and tolerance. Patient states that she has a mixture of anxiety and depression. She also reports having chronic back pain. She states that she previously had a therapist but  she left the states and currently has not found a new one. Patient states that her mood is well controlled. Patient would benefit from a program like this one that focuses on several health spokes and as weight increases it can take a toll on mental health.    Attestations:   LILLETTE Sonny Laroche, acting as a medical scribe for Barnie Jenkins, DO., have compiled all relevant documentation for today's office visit on behalf of Barnie Jenkins, DO, while in the presence of [employer], DO.  I have spent 48 minutes in the care of the patient today.  35 minutes was spent in face to face counseling of the patient on the disease of obesity and what our program can do for their medical conditions as well as in  preventing future diseases. I discussed the importance of comprehensive care in the treatment of obesity including mental well being and physical activity. 5 minutes was spent on pre-chart review and additional 8 minutes post visit documentation.   I have reviewed the above documentation for accuracy and completeness, and I agree with the above. Barnie JINNY Jenkins, D.O.  The 21st Century Cures Act was signed into law in 2016 which includes the topic of electronic health records.  This provides immediate access to information in MyChart.  This includes consultation notes, operative notes, office notes, lab results and pathology reports.  If you have any questions about what you read please let us  know at your next visit so we can discuss your concerns and take corrective action if need be.  We are right here with you!    [1]  Allergies Allergen Reactions   Vancomycin  Swelling    Face and throat   Lamotrigine Hives   Septra [Sulfamethoxazole-Trimethoprim] Rash   "

## 2025-01-09 ENCOUNTER — Ambulatory Visit (INDEPENDENT_AMBULATORY_CARE_PROVIDER_SITE_OTHER): Admitting: Family Medicine

## 2025-01-23 ENCOUNTER — Ambulatory Visit (INDEPENDENT_AMBULATORY_CARE_PROVIDER_SITE_OTHER): Admitting: Family Medicine

## 2025-09-02 ENCOUNTER — Ambulatory Visit: Admitting: Physician Assistant
